# Patient Record
Sex: Female | Born: 1960 | Race: White | Hispanic: No | Marital: Married | Smoking: Former smoker
Health system: Southern US, Community
[De-identification: ages and names within clinical notes are randomized; demographics above are authoritative.]

## PROBLEM LIST (undated history)

## (undated) DIAGNOSIS — D0510 Intraductal carcinoma in situ of unspecified breast: Secondary | ICD-10-CM

## (undated) DIAGNOSIS — E782 Mixed hyperlipidemia: Secondary | ICD-10-CM

## (undated) DIAGNOSIS — K219 Gastro-esophageal reflux disease without esophagitis: Secondary | ICD-10-CM

## (undated) DIAGNOSIS — E10649 Type 1 diabetes mellitus with hypoglycemia without coma: Secondary | ICD-10-CM

## (undated) DIAGNOSIS — Z8719 Personal history of other diseases of the digestive system: Secondary | ICD-10-CM

## (undated) DIAGNOSIS — H353230 Exudative age-related macular degeneration, bilateral, stage unspecified: Secondary | ICD-10-CM

## (undated) DIAGNOSIS — D649 Anemia, unspecified: Secondary | ICD-10-CM

## (undated) DIAGNOSIS — J45909 Unspecified asthma, uncomplicated: Secondary | ICD-10-CM

## (undated) DIAGNOSIS — E038 Other specified hypothyroidism: Secondary | ICD-10-CM

## (undated) DIAGNOSIS — Z87442 Personal history of urinary calculi: Secondary | ICD-10-CM

## (undated) DIAGNOSIS — Z800 Family history of malignant neoplasm of digestive organs: Secondary | ICD-10-CM

## (undated) HISTORY — DX: Family history of malignant neoplasm of digestive organs: Z80.0

## (undated) HISTORY — DX: Type 1 diabetes mellitus with hypoglycemia without coma: E10.649

## (undated) HISTORY — PX: ENDOMETRIAL ABLATION: SHX621

## (undated) HISTORY — DX: Intraductal carcinoma in situ of unspecified breast: D05.10

## (undated) HISTORY — PX: OTHER SURGICAL HISTORY: SHX169

## (undated) HISTORY — DX: Mixed hyperlipidemia: E78.2

## (undated) HISTORY — DX: Other specified hypothyroidism: E03.8

## (undated) HISTORY — DX: Exudative age-related macular degeneration, bilateral, stage unspecified: H35.3230

## (undated) HISTORY — PX: BREAST LUMPECTOMY: SHX2

## (undated) HISTORY — DX: Anemia, unspecified: D64.9

---

## 2002-02-07 ENCOUNTER — Emergency Department (HOSPITAL_COMMUNITY): Admission: EM | Admit: 2002-02-07 | Discharge: 2002-02-07 | Payer: Self-pay | Admitting: Emergency Medicine

## 2002-07-05 ENCOUNTER — Encounter: Admission: RE | Admit: 2002-07-05 | Discharge: 2002-07-05 | Payer: Self-pay | Admitting: Family Medicine

## 2002-07-05 ENCOUNTER — Encounter: Payer: Self-pay | Admitting: Family Medicine

## 2004-07-09 ENCOUNTER — Encounter: Admission: RE | Admit: 2004-07-09 | Discharge: 2004-07-09 | Payer: Self-pay | Admitting: Family Medicine

## 2006-07-27 ENCOUNTER — Other Ambulatory Visit: Admission: RE | Admit: 2006-07-27 | Discharge: 2006-07-27 | Payer: Self-pay | Admitting: Family Medicine

## 2007-09-12 ENCOUNTER — Other Ambulatory Visit: Admission: RE | Admit: 2007-09-12 | Discharge: 2007-09-12 | Payer: Self-pay | Admitting: Family Medicine

## 2007-12-26 ENCOUNTER — Encounter: Admission: RE | Admit: 2007-12-26 | Discharge: 2007-12-26 | Payer: Self-pay | Admitting: Family Medicine

## 2008-05-13 ENCOUNTER — Encounter: Admission: RE | Admit: 2008-05-13 | Discharge: 2008-05-13 | Payer: Self-pay | Admitting: Family Medicine

## 2008-07-12 ENCOUNTER — Encounter: Admission: RE | Admit: 2008-07-12 | Discharge: 2008-07-12 | Payer: Self-pay | Admitting: Family Medicine

## 2008-08-07 ENCOUNTER — Ambulatory Visit: Payer: Self-pay | Admitting: Obstetrics and Gynecology

## 2008-08-07 ENCOUNTER — Encounter: Payer: Self-pay | Admitting: Obstetrics and Gynecology

## 2008-08-07 ENCOUNTER — Other Ambulatory Visit: Admission: RE | Admit: 2008-08-07 | Discharge: 2008-08-07 | Payer: Self-pay | Admitting: Obstetrics and Gynecology

## 2008-09-02 ENCOUNTER — Ambulatory Visit: Payer: Self-pay | Admitting: Obstetrics and Gynecology

## 2008-09-08 ENCOUNTER — Emergency Department (HOSPITAL_COMMUNITY): Admission: EM | Admit: 2008-09-08 | Discharge: 2008-09-08 | Payer: Self-pay | Admitting: Emergency Medicine

## 2008-09-10 ENCOUNTER — Emergency Department (HOSPITAL_COMMUNITY): Admission: EM | Admit: 2008-09-10 | Discharge: 2008-09-11 | Payer: Self-pay | Admitting: Emergency Medicine

## 2008-11-01 ENCOUNTER — Ambulatory Visit: Payer: Self-pay | Admitting: Obstetrics and Gynecology

## 2008-11-07 ENCOUNTER — Ambulatory Visit: Payer: Self-pay | Admitting: Obstetrics and Gynecology

## 2008-11-07 ENCOUNTER — Ambulatory Visit (HOSPITAL_BASED_OUTPATIENT_CLINIC_OR_DEPARTMENT_OTHER): Admission: RE | Admit: 2008-11-07 | Discharge: 2008-11-07 | Payer: Self-pay | Admitting: Obstetrics and Gynecology

## 2008-11-07 ENCOUNTER — Encounter: Payer: Self-pay | Admitting: Obstetrics and Gynecology

## 2010-12-29 LAB — GLUCOSE, CAPILLARY: Glucose-Capillary: 171 mg/dL — ABNORMAL HIGH (ref 70–99)

## 2010-12-29 LAB — POCT I-STAT, CHEM 8
BUN: 12 mg/dL (ref 6–23)
Chloride: 105 mEq/L (ref 96–112)
Creatinine, Ser: 0.5 mg/dL (ref 0.4–1.2)
Potassium: 4.2 mEq/L (ref 3.5–5.1)
Sodium: 139 mEq/L (ref 135–145)
TCO2: 24 mmol/L (ref 0–100)

## 2011-01-26 NOTE — Op Note (Signed)
NAMEPAYTIENCE, BURES             ACCOUNT NO.:  [id number]   MEDICAL RECORD NO.:  [id number]          PATIENT TYPE:  AMB   LOCATION:  [facility]                         FACILITY:  [facility]   PHYSICIAN:  Daniel L. Gottsegen, M.D.DATE OF BIRTH:  28-Feb-1961   DATE OF PROCEDURE:  11/07/2008  DATE OF DISCHARGE:                               OPERATIVE REPORT   PREOPERATIVE DIAGNOSIS:  Menometrorrhagia.   POSTOPERATIVE DIAGNOSIS:  Menometrorrhagia, plus endometrial polyps.   NAME OF OPERATION:  Dilatation and curettage.   SURGEON:  Dr. Eda Paschal.   ANESTHESIA:  General.   INDICATIONS:  The patient is a 50 year old gravida 2, para 1-2-0-4 who  had presented to the office with extremely heavy periods as well as  bleeding between her periods.  Endometrial biopsy in the office was  normal.  Saline infusion histogram showed multiple intrauterine cavity  defects consistent with small polyps.  The patient also has myomas, and  so it was certainly possible she had a submucous myoma as well creating  the above.  She now enters the hospital for hysteroscopy dilatation and  curettage and excision of any intrauterine pathology.   FINDINGS:  External is normal.  BUS is normal.  Vaginal is normal.  Cervix is clean.  Uterus is top normal size and shape with almost no  descensus.  Adnexa fails to reveal masses.  At the time of hysteroscopy,  the patient had multiple endometrial polyps, both on  the anterior and  posterior walls of the fundus, and they were generally of  the size of 1-  2 cm.  Once these had been removed, the endometrial cavity was clean as  well as the intracervical cavity.   PROCEDURE:  After adequate general anesthesia, the patient was placed in  dorsal supine position, prepped and draped in the usual sterile manner.  A single-tooth tenaculum was placed in the anterior lip of the cervix.  The cervix was dilated to a #33 Pratt dilator, and a hysteroscopic  resectoscope was introduced with  ease.  Three percent sorbitol was used  to expand the intrauterine cavity.  A camera was used for magnification,  and 90-degree wire loop attached to appropriate Bovie settings was also  utilized.  The polyps were seen.  Initially, some curetting was done  because of the lush buildup of the endometrium, and then using the  resectoscope, the polyps were resected and removed as well.  She was  then curetted more because there was still a lot of endometrium left.  She was then rehysteroscoped a third time, and at this point, she had a  clean endometrial cavity with no more pathology.  The specimen showed  significant amount of endometrium and polyps.  It was sent in one  specimen to the lab for tissue diagnosis.  There was no bleeding at the  termination of the procedure from the intrauterine cavity.  Blood loss  was minimal.  Fluid deficit was 45 mL.  The patient tolerated the  procedure well and left the operating room in satisfactory condition.     Daniel L. Eda Paschal, M.D.  Electronically Signed  DLG/MEDQ  D:  11/07/2008  T:  11/07/2008  Job:  [id number]

## 2011-06-18 LAB — URINE CULTURE
Colony Count: NO GROWTH
Culture: NO GROWTH

## 2011-06-18 LAB — DIFFERENTIAL
Basophils Relative: 1 % (ref 0–1)
Eosinophils Absolute: 0 10*3/uL (ref 0.0–0.7)
Eosinophils Relative: 0 % (ref 0–5)
Lymphs Abs: 1.1 10*3/uL (ref 0.7–4.0)
Monocytes Relative: 7 % (ref 3–12)
Neutrophils Relative %: 84 % — ABNORMAL HIGH (ref 43–77)

## 2011-06-18 LAB — URINALYSIS, ROUTINE W REFLEX MICROSCOPIC
Bilirubin Urine: NEGATIVE
Glucose, UA: NEGATIVE mg/dL
Ketones, ur: NEGATIVE mg/dL
Leukocytes, UA: NEGATIVE
Nitrite: NEGATIVE
Nitrite: NEGATIVE
Protein, ur: NEGATIVE mg/dL
Specific Gravity, Urine: 1.026 (ref 1.005–1.030)
Urobilinogen, UA: 0.2 mg/dL (ref 0.0–1.0)
pH: 6 (ref 5.0–8.0)

## 2011-06-18 LAB — CBC
HCT: 33.9 % — ABNORMAL LOW (ref 36.0–46.0)
MCHC: 33.9 g/dL (ref 30.0–36.0)
MCV: 86.7 fL (ref 78.0–100.0)
Platelets: 218 10*3/uL (ref 150–400)
WBC: 13.3 10*3/uL — ABNORMAL HIGH (ref 4.0–10.5)

## 2011-06-18 LAB — POCT I-STAT, CHEM 8
Creatinine, Ser: 1.2 mg/dL (ref 0.4–1.2)
Glucose, Bld: 187 mg/dL — ABNORMAL HIGH (ref 70–99)
HCT: 36 % (ref 36.0–46.0)
Hemoglobin: 12.2 g/dL (ref 12.0–15.0)
Potassium: 4.4 mEq/L (ref 3.5–5.1)
Sodium: 134 mEq/L — ABNORMAL LOW (ref 135–145)
TCO2: 22 mmol/L (ref 0–100)

## 2011-06-18 LAB — GLUCOSE, CAPILLARY: Glucose-Capillary: 212 mg/dL — ABNORMAL HIGH (ref 70–99)

## 2011-06-18 LAB — URINE MICROSCOPIC-ADD ON

## 2019-08-07 ENCOUNTER — Encounter: Payer: Self-pay | Admitting: Family Medicine

## 2019-10-16 ENCOUNTER — Other Ambulatory Visit: Payer: Self-pay | Admitting: Family Medicine

## 2019-11-01 ENCOUNTER — Other Ambulatory Visit: Payer: Self-pay | Admitting: Family Medicine

## 2019-12-14 ENCOUNTER — Other Ambulatory Visit: Payer: Self-pay | Admitting: Family Medicine

## 2019-12-24 DIAGNOSIS — E782 Mixed hyperlipidemia: Secondary | ICD-10-CM | POA: Insufficient documentation

## 2019-12-24 DIAGNOSIS — E039 Hypothyroidism, unspecified: Secondary | ICD-10-CM | POA: Insufficient documentation

## 2019-12-24 DIAGNOSIS — E10649 Type 1 diabetes mellitus with hypoglycemia without coma: Secondary | ICD-10-CM | POA: Insufficient documentation

## 2019-12-24 DIAGNOSIS — E038 Other specified hypothyroidism: Secondary | ICD-10-CM | POA: Insufficient documentation

## 2019-12-24 DIAGNOSIS — E1065 Type 1 diabetes mellitus with hyperglycemia: Secondary | ICD-10-CM | POA: Insufficient documentation

## 2019-12-24 NOTE — Progress Notes (Signed)
Established Patient Office Visit  Subjective:  Patient ID: Nicole Marquez, female    DOB: 1961/01/30  Age: 59 y.o. MRN: [id number]  CC:  Chief Complaint  Patient presents with  . Hyperlipidemia  . Diabetes  . Hypothyroidism    HPI Nicole Marquez presents with type 1 diabetes mellitus without complications.  Specifically, this is type 1 diabetes without complications.  Compliance with treatment has been good; she takes her medication as directed, maintains her diet and exercise regimen, follows up as directed, and is keeping a glucose diary.  Date of diagnosis 16.  Patient's diabetes was first diagnosed 50 years ago.  She follows a prescribed diet.  She denies experiencing any diabetes related symptoms.  Current meds include insulin/injectable ( NovoLog; Tyler Aas ) and Altace.  The hypoglycemic episodes are with blood sugars recorded in 60s.  She reports home blood glucose readings 60s in am, 130 at lunch, 250 postprandial to supper, and 150s before bedtime. She has free style libre.  In regard to preventative care, she performs foot self-exams daily. She sees the eye doctor regularly for macular degeneration which is improving with injections.    With regard to the other specified hypothyroidism, she cannot recall when the diagnosis of was made.  She is currently taking Synthroid, 100 mcg daily.     Past Medical History:  Diagnosis Date  . Exudative age-related macular degeneration, bilateral, stage unspecified ([place])   . Family history of malignant neoplasm of digestive organs   . Mixed hyperlipidemia   . Other specified hypothyroidism   . Type 1 diabetes mellitus with hypoglycemia without coma [facility])     Past Surgical History:  Procedure Laterality Date  . CESAREAN SECTION     x3  . ENDOMETRIAL ABLATION    . Right rotator cuff repair      Family History  Problem Relation Age of Onset  . Colon cancer Brother   . Cancer Other   . Anuerysm Other   . Diabetes type II  Other     Social History   Socioeconomic History  . Marital status: Married    Spouse name: Not on file  . Number of children: 4  . Years of education: Not on file  . Highest education level: Not on file  Occupational History  . Not on file  Tobacco Use  . Smoking status: Former Smoker    Types: Cigarettes    Quit date: 2008    Years since quitting: 13.3  . Smokeless tobacco: Never Used  Substance and Sexual Activity  . Alcohol use: Never  . Drug use: Never  . Sexual activity: Not on file  Other Topics Concern  . Not on file  Social History Narrative   Patient has 1 set of twins and two individual children.   Social Determinants of Health   Financial Resource Strain:   . Difficulty of Paying Living Expenses:   Food Insecurity:   . Worried About Charity fundraiser in the Last Year:   . Arboriculturist in the Last Year:   Transportation Needs:   . Film/video editor (Medical):   Marland Kitchen Lack of Transportation (Non-Medical):   Physical Activity:   . Days of Exercise per Week:   . Minutes of Exercise per Session:   Stress:   . Feeling of Stress :   Social Connections:   . Frequency of Communication with Friends and Family:   . Frequency of Social Gatherings with Friends and Family:   .  Attends Religious Services:   . Active Member of Clubs or Organizations:   . Attends Archivist Meetings:   Marland Kitchen Marital Status:   Intimate Partner Violence:   . Fear of Current or Ex-Partner:   . Emotionally Abused:   Marland Kitchen Physically Abused:   . Sexually Abused:     Outpatient Medications Prior to Visit  Medication Sig Dispense Refill  . albuterol (VENTOLIN HFA) 108 (90 Base) MCG/ACT inhaler INHALE 1 TO 2 PUFFS EVERY 4 HOURS AS NEEDED FOR WHEEZING 8.5 g 2  . Continuous Blood Gluc Sensor (FREESTYLE LIBRE 2 SENSOR) MISC CHECK GLUCOSE EVERY DAY BEFORE A MEAL AND EVERY NIGHT AT BEDTIME 6 each 3  . insulin aspart (NOVOLOG FLEXPEN) 100 UNIT/ML FlexPen Inject into the skin 3  (three) times daily with meals.    . Insulin Pen Needle 32G X 4 MM MISC by Does not apply route.    . rosuvastatin (CRESTOR) 40 MG tablet Take 40 mg by mouth daily.    . TRESIBA FLEXTOUCH 200 UNIT/ML SOPN ADMINISTER 28 UNITS UNDER THE SKIN DAILY 15 mL 1  . levothyroxine (SYNTHROID) 100 MCG tablet Take 100 mcg by mouth daily before breakfast.    . ramipril (ALTACE) 5 MG capsule Take 5 mg by mouth daily.     No facility-administered medications prior to visit.    Allergies  Allergen Reactions  . Ciprofloxacin     ROS Review of Systems  Constitutional: Negative for chills, fatigue and fever.  HENT: Negative for congestion, ear pain, rhinorrhea and sore throat.   Respiratory: Positive for shortness of breath. Negative for cough.        With anxiety. Using albuterol a little more.   Cardiovascular: Negative for chest pain.  Gastrointestinal: Negative for abdominal pain, constipation, diarrhea, nausea and vomiting.  Endocrine: Negative for polydipsia, polyphagia and polyuria.  Genitourinary: Negative for dysuria and urgency.  Musculoskeletal: Negative for back pain and myalgias.  Neurological: Positive for headaches. Negative for dizziness, weakness and light-headedness.       Often. 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 Has improved sight. Gets Headaches when gets shots.   Psychiatric/Behavioral: Positive for sleep disturbance. Negative for dysphoric mood and suicidal ideas. The patient is nervous/anxious.        Panic attacks 4 times in the last 6 months. Worry every day. Took xanax when her mother died. Difficulty going to sleep and staying asleep. Gets on average 4 hours.      Objective:    Physical Exam  Constitutional: She is oriented to person, place, and time. She appears well-developed and well-nourished.  Cardiovascular: Normal rate, regular rhythm and normal heart sounds.  Pulmonary/Chest: Effort normal and breath sounds normal. No respiratory distress.  Abdominal: Soft.  Bowel sounds are normal. There is no abdominal tenderness.  Neurological: She is alert and oriented to person, place, and time.  Psychiatric: She has a normal mood and affect. Her behavior is normal.   Diabetic Foot Exam - Simple   Simple Foot Form Visual Inspection No deformities, no ulcerations, no other skin breakdown bilaterally: Yes Sensation Testing Intact to touch and monofilament testing bilaterally: Yes Pulse Check Posterior Tibialis and Dorsalis pulse intact bilaterally: Yes Comments    BP 118/64 (BP Location: Left Arm, Patient Position: Sitting)   Pulse 79   Temp (!) 97.3 F (36.3 C) (Temporal)   Ht 5' (1.524 m)   Wt 113 lb (51.3 kg)   SpO2 100%   BMI 22.07 kg/m  Wt Readings from  Last 3 Encounters:  12/25/19 113 lb (51.3 kg)     Health Maintenance Due  Topic Date Due  . Hepatitis C Screening  Never done  . PNEUMOCOCCAL POLYSACCHARIDE VACCINE AGE 89-64 HIGH RISK  Never done  . FOOT EXAM  Never done  . OPHTHALMOLOGY EXAM  Never done  . HIV Screening  Never done  . COVID-19 Vaccine (1) Never done  . TETANUS/TDAP  Never done  . PAP SMEAR-Modifier  Never done  . COLONOSCOPY  Never done  . MAMMOGRAM  12/25/2009    There are no preventive care reminders to display for this patient.  Lab Results  Component Value Date   TSH 0.152 (L) 12/25/2019   Lab Results  Component Value Date   WBC 10.8 12/25/2019   HGB 16.2 (H) 12/25/2019   HCT 48.6 (H) 12/25/2019   MCV 91 12/25/2019   PLT 262 12/25/2019   Lab Results  Component Value Date   NA 141 12/25/2019   K 4.5 12/25/2019   CO2 25 12/25/2019   GLUCOSE 41 (L) 12/25/2019   BUN 12 12/25/2019   CREATININE 0.83 12/25/2019   BILITOT 0.2 12/25/2019   ALKPHOS 75 12/25/2019   AST 23 12/25/2019   ALT 12 12/25/2019   PROT 6.6 12/25/2019   ALBUMIN 4.7 12/25/2019   CALCIUM 10.4 (H) 12/25/2019   Lab Results  Component Value Date   CHOL 240 (H) 12/25/2019   Lab Results  Component Value Date   HDL 60  12/25/2019   Lab Results  Component Value Date   LDLCALC 162 (H) 12/25/2019   Lab Results  Component Value Date   TRIG 101 12/25/2019   Lab Results  Component Value Date   CHOLHDL 4.0 12/25/2019   Lab Results  Component Value Date   HGBA1C 6.3 (H) 12/25/2019      Assessment & Plan:  1. Type 1 diabetes mellitus with hypoglycemia without coma ([place]).  Control: good Recommend check sugars fasting before meals and before bed. Recommend check feet daily. Recommend annual eye exams. Medicines: Decrease tresiba to 26 U daily. Continue novolog SSI Continue to work on eating a healthy diet and exercise.  Labs drawn today.   - Hemoglobin A1c - POCT UA - Microalbumin  2. Mixed hyperlipidemia Still poorly controlled.  Patient has only been taking crestor 40 mg daily 3 times a week. She will try to increase to four times a wee.   Continue to work on eating a healthy diet and exercise.  Labs drawn today.  - Lipid panel - Comprehensive metabolic panel - CBC with Differential/Platelet  3. Other specified hypothyroidism Not therapeutic. Decrease synthroid to 88 mcg daily. Recheck in 6-8 weeks. - TSH  5. Mild intermittent asthma without complication Continue use of albuterol. If increased use to more than once daily, call and will start on a preventative medicine.   Follow-up: Return in about 3 months (around 03/25/2020) for fasting.    Rochel Brome, MD

## 2019-12-25 ENCOUNTER — Encounter: Payer: Self-pay | Admitting: Family Medicine

## 2019-12-25 ENCOUNTER — Other Ambulatory Visit: Payer: Self-pay

## 2019-12-25 ENCOUNTER — Ambulatory Visit: Payer: BC Managed Care – PPO | Admitting: Family Medicine

## 2019-12-25 VITALS — BP 118/64 | HR 79 | Temp 97.3°F | Ht 60.0 in | Wt 113.0 lb

## 2019-12-25 DIAGNOSIS — E038 Other specified hypothyroidism: Secondary | ICD-10-CM

## 2019-12-25 DIAGNOSIS — J4520 Mild intermittent asthma, uncomplicated: Secondary | ICD-10-CM

## 2019-12-25 DIAGNOSIS — E10649 Type 1 diabetes mellitus with hypoglycemia without coma: Secondary | ICD-10-CM

## 2019-12-25 DIAGNOSIS — F410 Panic disorder [episodic paroxysmal anxiety] without agoraphobia: Secondary | ICD-10-CM | POA: Insufficient documentation

## 2019-12-25 DIAGNOSIS — E782 Mixed hyperlipidemia: Secondary | ICD-10-CM | POA: Diagnosis not present

## 2019-12-25 LAB — POCT UA - MICROALBUMIN: Microalbumin Ur, POC: 150 mg/L

## 2019-12-25 NOTE — Patient Instructions (Addendum)
Type 1 DM - decrease tresiba to 26 U daily in am.  Panic attacks - start lorazepam 0.5 mg once daily as needed for panic attacks. Asthma - use albuterol inhaler. If worsens, call back and will give another medicine.  Panic Attack  A panic attack is when you suddenly feel very afraid, uncomfortable, or nervous (anxious). A panic attack can happen when you are scared or for no reason. A panic attack can feel like a serious problem. It can even feel like a heart attack or stroke. See your doctor when you have a panic attack to make sure you do not have a serious problem. Follow these instructions at home:  Take medicines only as told by your doctor.  If you feel worried or nervous, try not to have caffeine.  Take good care of your health. To do this: ? Eat healthy. Make sure to eat fresh fruits and vegetables, whole grains, lean meats, and low-fat dairy. ? Get enough sleep. Try to sleep for 7-8 hours each night. ? Exercise. Try to be active for 30 minutes 5 or more days a week. ? Do not smoke. Talk to your doctor if you need help quitting. ? Limit how much alcohol you drink:  If you are a woman who is not pregnant: try not to have more than 1 drink a day.  If you are a man: try not to have more than 2 drinks a day.  One drink equals 12 oz of beer, 5 oz of wine, or 1 oz of hard liquor.  Keep all follow-up visits as told by your doctor. This is important. Contact a doctor if:  Your symptoms do not get better.  Your symptoms get worse.  You are not able to take your medicines as told. Get help right away if:  You have thoughts of hurting yourself or others.  You have symptoms of a panic attack. Do not drive yourself to the hospital. Have someone else drive you or call an ambulance. If you feel like you may hurt yourself or others, or have thoughts about taking your own life, get help right away. You can go to your nearest emergency department or call:  Your local emergency  services (911 in the [place]  A suicide crisis helpline, such as the [facility] at (229)073-5725. This is open 24 hours a day. Summary  A panic attack is when you suddenly feel very afraid, uncomfortable, or nervous (anxious).  See your doctor when you have a panic attack to make sure that you do not have another serious problem.  If you feel like you may hurt yourself or others, get help right away by calling 911. This information is not intended to replace advice given to you by your health care provider. Make sure you discuss any questions you have with your health care provider. Document Revised: 08/12/2017 Document Reviewed: 10/13/2016 Elsevier Patient Education  [place], Adult After being diagnosed with an anxiety disorder, you may be relieved to know why you have felt or behaved a certain way. You may also feel overwhelmed about the treatment ahead and what it will mean for your life. With care and support, you can manage this condition and recover from it. How to manage lifestyle changes Managing stress and anxiety  Stress is your body's reaction to life changes and events, both good and bad. Most stress will last just a few hours, but stress can be ongoing and can lead to  more than just stress. Although stress can play a major role in anxiety, it is not the same as anxiety. Stress is usually caused by something external, such as a deadline, test, or competition. Stress normally passes after the triggering event has ended.  Anxiety is caused by something internal, such as imagining a terrible outcome or worrying that something will go wrong that will devastate you. Anxiety often does not go away even after the triggering event is over, and it can become long-term (chronic) worry. It is important to understand the differences between stress and anxiety and to manage your stress effectively so that it does not lead to an anxious  response. Talk with your health care provider or a counselor to learn more about reducing anxiety and stress. He or she may suggest tension reduction techniques, such as:  Music therapy. This can include creating or listening to music that you enjoy and that inspires you.  Mindfulness-based meditation. This involves being aware of your normal breaths while not trying to control your breathing. It can be done while sitting or walking.  Centering prayer. This involves focusing on a word, phrase, or sacred image that means something to you and brings you peace.  Deep breathing. To do this, expand your stomach and inhale slowly through your nose. Hold your breath for 3-5 seconds. Then exhale slowly, letting your stomach muscles relax.  Self-talk. This involves identifying thought patterns that lead to anxiety reactions and changing those patterns.  Muscle relaxation. This involves tensing muscles and then relaxing them. Choose a tension reduction technique that suits your lifestyle and personality. These techniques take time and practice. Set aside 5-15 minutes a day to do them. Therapists can offer counseling and training in these techniques. The training to help with anxiety may be covered by some insurance plans. Other things you can do to manage stress and anxiety include:  Keeping a stress/anxiety diary. This can help you learn what triggers your reaction and then learn ways to manage your response.  Thinking about how you react to certain situations. You may not be able to control everything, but you can control your response.  Making time for activities that help you relax and not feeling guilty about spending your time in this way.  Visual imagery and yoga can help you stay calm and relax.  Medicines Medicines can help ease symptoms. Medicines for anxiety include:  Anti-anxiety drugs.  Antidepressants. Medicines are often used as a primary treatment for anxiety disorder. Medicines  will be prescribed by a health care provider. When used together, medicines, psychotherapy, and tension reduction techniques may be the most effective treatment. Relationships Relationships can play a big part in helping you recover. Try to spend more time connecting with trusted friends and family members. Consider going to couples counseling, taking family education classes, or going to family therapy. Therapy can help you and others better understand your condition. How to recognize changes in your anxiety Everyone responds differently to treatment for anxiety. Recovery from anxiety happens when symptoms decrease and stop interfering with your daily activities at home or work. This may mean that you will start to:  Have better concentration and focus. Worry will interfere less in your daily thinking.  Sleep better.  Be less irritable.  Have more energy.  Have improved memory. It is important to recognize when your condition is getting worse. Contact your health care provider if your symptoms interfere with home or work and you feel like your condition is not  improving. Follow these instructions at home: Activity  Exercise. Most adults should do the following: ? Exercise for at least 150 minutes each week. The exercise should increase your heart rate and make you sweat (moderate-intensity exercise). ? Strengthening exercises at least twice a week.  Get the right amount and quality of sleep. Most adults need 7-9 hours of sleep each night. Lifestyle   Eat a healthy diet that includes plenty of vegetables, fruits, whole grains, low-fat dairy products, and lean protein. Do not eat a lot of foods that are high in solid fats, added sugars, or salt.  Make choices that simplify your life.  Do not use any products that contain nicotine or tobacco, such as cigarettes, e-cigarettes, and chewing tobacco. If you need help quitting, ask your health care provider.  Avoid caffeine, alcohol, and  certain over-the-counter cold medicines. These may make you feel worse. Ask your pharmacist which medicines to avoid. General instructions  Take over-the-counter and prescription medicines only as told by your health care provider.  Keep all follow-up visits as told by your health care provider. This is important. Where to find support You can get help and support from these sources:  Self-help groups.  Online and [employer].  A trusted spiritual leader.  Couples counseling.  Family education classes.  Family therapy. Where to find more information You may find that joining a support group helps you deal with your anxiety. The following sources can help you locate counselors or support groups near you:  [place]: www.mentalhealthamer[place].net  Anxiety and Depression Association of [place] (ADAA): https://www.clark.net/  National Alliance on Mental Illness (NAMI): www.nami.org Contact a health care provider if you:  Have a hard time staying focused or finishing daily tasks.  Spend many hours a day feeling worried about everyday life.  Become exhausted by worry.  Start to have headaches, feel tense, or have nausea.  Urinate more than normal.  Have diarrhea. Get help right away if you have:  A racing heart and shortness of breath.  Thoughts of hurting yourself or others. If you ever feel like you may hurt yourself or others, or have thoughts about taking your own life, get help right away. You can go to your nearest emergency department or call:  Your local emergency services (911 in the [place]  A suicide crisis helpline, such as the [facility] at 219-288-3346. This is open 24 hours a day. Summary  Taking steps to learn and use tension reduction techniques can help calm you and help prevent triggering an anxiety reaction.  When used together, medicines, psychotherapy, and tension reduction techniques may be the most  effective treatment.  Family, friends, and partners can play a big part in helping you recover from an anxiety disorder. This information is not intended to replace advice given to you by your health care provider. Make sure you discuss any questions you have with your health care provider. Document Revised: 01/30/2019 Document Reviewed: 01/30/2019 Elsevier Patient Education  [place].  Generalized Anxiety Disorder, Adult Generalized anxiety disorder (GAD) is a mental health disorder. People with this condition constantly worry about everyday events. Unlike normal anxiety, worry related to GAD is not triggered by a specific event. These worries also do not fade or get better with time. GAD interferes with life functions, including relationships, work, and school. GAD can vary from mild to severe. People with severe GAD can have intense waves of anxiety with physical symptoms (panic attacks). What are the causes?  The exact cause of GAD is not known. What increases the risk? This condition is more likely to develop in:  Women.  People who have a family history of anxiety disorders.  People who are very shy.  People who experience very stressful life events, such as the death of a loved one.  People who have a very stressful family environment. What are the signs or symptoms? People with GAD often worry excessively about many things in their lives, such as their health and family. They may also be overly concerned about:  Doing well at work.  Being on time.  Natural disasters.  Friendships. Physical symptoms of GAD include:  Fatigue.  Muscle tension or having muscle twitches.  Trembling or feeling shaky.  Being easily startled.  Feeling like your heart is pounding or racing.  Feeling out of breath or like you cannot take a deep breath.  Having trouble falling asleep or staying asleep.  Sweating.  Nausea, diarrhea, or irritable bowel syndrome  (IBS).  Headaches.  Trouble concentrating or remembering facts.  Restlessness.  Irritability. How is this diagnosed? Your health care provider can diagnose GAD based on your symptoms and medical history. You will also have a physical exam. The health care provider will ask specific questions about your symptoms, including how severe they are, when they started, and if they come and go. Your health care provider may ask you about your use of alcohol or drugs, including prescription medicines. Your health care provider may refer you to a mental health specialist for further evaluation. Your health care provider will do a thorough examination and may perform additional tests to rule out other possible causes of your symptoms. To be diagnosed with GAD, a person must have anxiety that:  Is out of his or her control.  Affects several different aspects of his or her life, such as work and relationships.  Causes distress that makes him or her unable to take part in normal activities.  Includes at least three physical symptoms of GAD, such as restlessness, fatigue, trouble concentrating, irritability, muscle tension, or sleep problems. Before your health care provider can confirm a diagnosis of GAD, these symptoms must be present more days than they are not, and they must last for six months or longer. How is this treated? The following therapies are usually used to treat GAD:  Medicine. Antidepressant medicine is usually prescribed for long-term daily control. Antianxiety medicines may be added in severe cases, especially when panic attacks occur.  Talk therapy (psychotherapy). Certain types of talk therapy can be helpful in treating GAD by providing support, education, and guidance. Options include: ? Cognitive behavioral therapy (CBT). People learn coping skills and techniques to ease their anxiety. They learn to identify unrealistic or negative thoughts and behaviors and to replace them with  positive ones. ? Acceptance and commitment therapy (ACT). This treatment teaches people how to be mindful as a way to cope with unwanted thoughts and feelings. ? Biofeedback. This process trains you to manage your body's response (physiological response) through breathing techniques and relaxation methods. You will work with a therapist while machines are used to monitor your physical symptoms.  Stress management techniques. These include yoga, meditation, and exercise. A mental health specialist can help determine which treatment is best for you. Some people see improvement with one type of therapy. However, other people require a combination of therapies. Follow these instructions at home:  Take over-the-counter and prescription medicines only as told by your health care provider.  Try to maintain a normal routine.  Try to anticipate stressful situations and allow extra time to manage them.  Practice any stress management or self-calming techniques as taught by your health care provider.  Do not punish yourself for setbacks or for not making progress.  Try to recognize your accomplishments, even if they are small.  Keep all follow-up visits as told by your health care provider. This is important. Contact a health care provider if:  Your symptoms do not get better.  Your symptoms get worse.  You have signs of depression, such as: ? A persistently sad, cranky, or irritable mood. ? Loss of enjoyment in activities that used to bring you joy. ? Change in weight or eating. ? Changes in sleeping habits. ? Avoiding friends or family members. ? Loss of energy for normal tasks. ? Feelings of guilt or worthlessness. Get help right away if:  You have serious thoughts about hurting yourself or others. If you ever feel like you may hurt yourself or others, or have thoughts about taking your own life, get help right away. You can go to your nearest emergency department or call:  Your local  emergency services (911 in the [place]  A suicide crisis helpline, such as the [facility] at 226-038-3080. This is open 24 hours a day. Summary  Generalized anxiety disorder (GAD) is a mental health disorder that involves worry that is not triggered by a specific event.  People with GAD often worry excessively about many things in their lives, such as their health and family.  GAD may cause physical symptoms such as restlessness, trouble concentrating, sleep problems, frequent sweating, nausea, diarrhea, headaches, and trembling or muscle twitching.  A mental health specialist can help determine which treatment is best for you. Some people see improvement with one type of therapy. However, other people require a combination of therapies. This information is not intended to replace advice given to you by your health care provider. Make sure you discuss any questions you have with your health care provider. Document Revised: 08/12/2017 Document Reviewed: 07/20/2016 Elsevier Patient Education  2020 [employer].

## 2019-12-26 ENCOUNTER — Other Ambulatory Visit: Payer: Self-pay | Admitting: Family Medicine

## 2019-12-26 ENCOUNTER — Telehealth: Payer: Self-pay

## 2019-12-26 LAB — COMPREHENSIVE METABOLIC PANEL
ALT: 12 IU/L (ref 0–32)
AST: 23 IU/L (ref 0–40)
Albumin/Globulin Ratio: 2.5 — ABNORMAL HIGH (ref 1.2–2.2)
Albumin: 4.7 g/dL (ref 3.8–4.8)
Alkaline Phosphatase: 75 IU/L (ref 39–117)
BUN/Creatinine Ratio: 14 (ref 12–28)
BUN: 12 mg/dL (ref 8–27)
Bilirubin Total: 0.2 mg/dL (ref 0.0–1.2)
CO2: 25 mmol/L (ref 20–29)
Calcium: 10.4 mg/dL — ABNORMAL HIGH (ref 8.7–10.3)
Chloride: 103 mmol/L (ref 96–106)
Creatinine, Ser: 0.83 mg/dL (ref 0.57–1.00)
GFR calc Af Amer: 88 mL/min/{1.73_m2} (ref >59)
GFR calc non Af Amer: 76 mL/min/{1.73_m2} (ref >59)
Globulin, Total: 1.9 g/dL (ref 1.5–4.5)
Glucose: 41 mg/dL — ABNORMAL LOW (ref 65–99)
Potassium: 4.5 mmol/L (ref 3.5–5.2)
Sodium: 141 mmol/L (ref 134–144)
Total Protein: 6.6 g/dL (ref 6.0–8.5)

## 2019-12-26 LAB — LIPID PANEL
Chol/HDL Ratio: 4 ratio (ref 0.0–4.4)
Cholesterol, Total: 240 mg/dL — ABNORMAL HIGH (ref 100–199)
HDL: 60 mg/dL (ref >39)
LDL Chol Calc (NIH): 162 mg/dL — ABNORMAL HIGH (ref 0–99)
Triglycerides: 101 mg/dL (ref 0–149)
VLDL Cholesterol Cal: 18 mg/dL (ref 5–40)

## 2019-12-26 LAB — CBC WITH DIFFERENTIAL/PLATELET
Basophils Absolute: 0.1 10*3/uL (ref 0.0–0.2)
Basos: 1 %
EOS (ABSOLUTE): 0.1 10*3/uL (ref 0.0–0.4)
Eos: 1 %
Hematocrit: 48.6 % — ABNORMAL HIGH (ref 34.0–46.6)
Hemoglobin: 16.2 g/dL — ABNORMAL HIGH (ref 11.1–15.9)
Immature Grans (Abs): 0.1 10*3/uL (ref 0.0–0.1)
Immature Granulocytes: 1 %
Lymphocytes Absolute: 3.7 10*3/uL — ABNORMAL HIGH (ref 0.7–3.1)
Lymphs: 34 %
MCH: 30.3 pg (ref 26.6–33.0)
MCHC: 33.3 g/dL (ref 31.5–35.7)
MCV: 91 fL (ref 79–97)
Monocytes Absolute: 0.7 10*3/uL (ref 0.1–0.9)
Monocytes: 6 %
Neutrophils Absolute: 6.2 10*3/uL (ref 1.4–7.0)
Neutrophils: 57 %
Platelets: 262 10*3/uL (ref 150–450)
RBC: 5.35 x10E6/uL — ABNORMAL HIGH (ref 3.77–5.28)
RDW: 12.3 % (ref 11.7–15.4)
WBC: 10.8 10*3/uL (ref 3.4–10.8)

## 2019-12-26 LAB — HEMOGLOBIN A1C
Est. average glucose Bld gHb Est-mCnc: 134 mg/dL
Hgb A1c MFr Bld: 6.3 % — ABNORMAL HIGH (ref 4.8–5.6)

## 2019-12-26 LAB — TSH: TSH: 0.152 u[IU]/mL — ABNORMAL LOW (ref 0.450–4.500)

## 2019-12-26 LAB — CARDIOVASCULAR RISK ASSESSMENT

## 2019-12-26 MED ORDER — LORAZEPAM 0.5 MG PO TABS: 0.5000 mg | ORAL_TABLET | Freq: Every day | ORAL | 1 refills | Status: DC | PRN

## 2019-12-26 NOTE — Telephone Encounter (Signed)
Sent lorazepam. kc

## 2019-12-26 NOTE — Telephone Encounter (Signed)
Patients husband called lvm stating that patient was suppose to have had a rx for Lorazepam sent to pharmacy yesterday?

## 2019-12-27 ENCOUNTER — Other Ambulatory Visit: Payer: Self-pay

## 2019-12-27 MED ORDER — RAMIPRIL 10 MG PO CAPS: 10.0000 mg | ORAL_CAPSULE | Freq: Every day | ORAL | 0 refills | Status: DC

## 2019-12-27 MED ORDER — LEVOTHYROXINE SODIUM 88 MCG PO TABS: 88.0000 ug | ORAL_TABLET | Freq: Every day | ORAL | 1 refills | Status: DC

## 2019-12-30 DIAGNOSIS — J4520 Mild intermittent asthma, uncomplicated: Secondary | ICD-10-CM | POA: Insufficient documentation

## 2020-01-04 ENCOUNTER — Other Ambulatory Visit: Payer: Self-pay | Admitting: Family Medicine

## 2020-02-18 ENCOUNTER — Other Ambulatory Visit: Payer: Self-pay | Admitting: Family Medicine

## 2020-03-08 ENCOUNTER — Other Ambulatory Visit: Payer: Self-pay | Admitting: Family Medicine

## 2020-03-14 ENCOUNTER — Other Ambulatory Visit: Payer: Self-pay | Admitting: Family Medicine

## 2020-03-25 ENCOUNTER — Other Ambulatory Visit: Payer: Self-pay | Admitting: Family Medicine

## 2020-03-28 ENCOUNTER — Ambulatory Visit: Payer: BC Managed Care – PPO | Admitting: Family Medicine

## 2020-04-14 NOTE — Progress Notes (Signed)
Acute Office Visit  Subjective:    Patient ID: Nicole Marquez, female    DOB: June 05, 1961, 59 y.o.   MRN: [id number]  Chief Complaint  Patient presents with  . Constipation    HPI Patient is in today for constipation states she has had this issue for 3-4 weeks. She thought maybe she pulled a muscle while working out 4 weeks ago however, the pain in her right abdomin has not improved and her constipation started 1 week after her abdominal pain. Has tried senna and dulcolax which have helped some. Has to take senna daily in order to have a BM. Was concerned and so instead changed increase fiber in her diet. Had to take dulcolax and this helped, but does not want to take senna or dulcolax daily. Hurts more when she lifts weight. She was working out 4 days a week, but had to stop. Some nausea.     Past Medical History:  Diagnosis Date  . Exudative age-related macular degeneration, bilateral, stage unspecified ([place])   . Family history of malignant neoplasm of digestive organs   . Mixed hyperlipidemia   . Other specified hypothyroidism   . Type 1 diabetes mellitus with hypoglycemia without coma [facility])     Past Surgical History:  Procedure Laterality Date  . CESAREAN SECTION     x3  . ENDOMETRIAL ABLATION    . Right rotator cuff repair      Family History  Problem Relation Age of Onset  . Colon cancer Brother   . Cancer Other   . Anuerysm Other   . Diabetes type II Other     Social History   Socioeconomic History  . Marital status: Married    Spouse name: Not on file  . Number of children: 4  . Years of education: Not on file  . Highest education level: Not on file  Occupational History  . Not on file  Tobacco Use  . Smoking status: Former Smoker    Types: Cigarettes    Quit date: 2008    Years since quitting: 13.6  . Smokeless tobacco: Never Used  Vaping Use  . Vaping Use: Never used  Substance and Sexual Activity  . Alcohol use: Never  . Drug use: Never  .  Sexual activity: Not on file  Other Topics Concern  . Not on file  Social History Narrative   Patient has 1 set of twins and two individual children.   Social Determinants of Health   Financial Resource Strain:   . Difficulty of Paying Living Expenses:   Food Insecurity:   . Worried About Charity fundraiser in the Last Year:   . Arboriculturist in the Last Year:   Transportation Needs:   . Film/video editor (Medical):   Marland Kitchen Lack of Transportation (Non-Medical):   Physical Activity:   . Days of Exercise per Week:   . Minutes of Exercise per Session:   Stress:   . Feeling of Stress :   Social Connections:   . Frequency of Communication with Friends and Family:   . Frequency of Social Gatherings with Friends and Family:   . Attends Religious Services:   . Active Member of Clubs or Organizations:   . Attends Archivist Meetings:   Marland Kitchen Marital Status:   Intimate Partner Violence:   . Fear of Current or Ex-Partner:   . Emotionally Abused:   Marland Kitchen Physically Abused:   . Sexually Abused:  Outpatient Medications Prior to Visit  Medication Sig Dispense Refill  . albuterol (VENTOLIN HFA) 108 (90 Base) MCG/ACT inhaler INHALE 1 TO 2 PUFFS EVERY 4 HOURS AS NEEDED FOR WHEEZING 8.5 g 2  . Continuous Blood Gluc Sensor (FREESTYLE LIBRE 2 SENSOR) MISC CHECK GLUCOSE EVERY DAY BEFORE A MEAL AND EVERY NIGHT AT BEDTIME 6 each 3  . insulin aspart (NOVOLOG FLEXPEN) 100 UNIT/ML FlexPen Inject into the skin 3 (three) times daily with meals.    . Insulin Pen Needle 32G X 4 MM MISC by Does not apply route.    Marland Kitchen levothyroxine (SYNTHROID) 88 MCG tablet Take 1 tablet (88 mcg total) by mouth daily before breakfast. Patient is due for thyroid test. Please call office to set up lab visit. Thanks Dr. Tobie Poet 30 tablet 0  . LORazepam (ATIVAN) 0.5 MG tablet TAKE 1 TABLET(0.5 MG) BY MOUTH DAILY AS NEEDED FOR ANXIETY 30 tablet 0  . ramipril (ALTACE) 10 MG capsule TAKE 1 CAPSULE(10 MG) BY MOUTH DAILY 90  capsule 0  . rosuvastatin (CRESTOR) 40 MG tablet Take 40 mg by mouth daily.    . TRESIBA FLEXTOUCH 200 UNIT/ML SOPN ADMINISTER 28 UNITS UNDER THE SKIN DAILY 15 mL 1   No facility-administered medications prior to visit.    Allergies  Allergen Reactions  . Ciprofloxacin     Review of Systems  Constitutional: Negative for chills, fatigue and fever.  HENT: Negative for congestion, ear pain, rhinorrhea and sore throat.   Respiratory: Negative for cough and shortness of breath.   Cardiovascular: Negative for chest pain.  Gastrointestinal: Positive for abdominal pain and constipation. Negative for diarrhea, nausea and vomiting.       Objective:    Physical Exam Vitals reviewed.  Constitutional:      Appearance: Normal appearance. She is normal weight.  Cardiovascular:     Rate and Rhythm: Normal rate and regular rhythm.     Pulses: Normal pulses.     Heart sounds: Normal heart sounds.  Pulmonary:     Effort: Pulmonary effort is normal. No respiratory distress.     Breath sounds: Normal breath sounds.  Abdominal:     General: Abdomen is flat. Bowel sounds are normal. There is no distension.     Palpations: Abdomen is soft. There is no mass.     Tenderness: There is abdominal tenderness (RLQ. ). There is no guarding or rebound.     Hernia: No hernia is present.  Neurological:     Mental Status: She is alert and oriented to person, place, and time.  Psychiatric:        Mood and Affect: Mood normal.        Behavior: Behavior normal.     BP 120/60   Pulse 73   Temp (!) 97.5 F (36.4 C)   Ht 5' (1.524 m)   Wt 112 lb (50.8 kg)   SpO2 100%   BMI 21.87 kg/m  Wt Readings from Last 3 Encounters:  04/15/20 112 lb (50.8 kg)  12/25/19 113 lb (51.3 kg)    Health Maintenance Due  Topic Date Due  . Hepatitis C Screening  Never done  . PNEUMOCOCCAL POLYSACCHARIDE VACCINE AGE 77-64 HIGH RISK  Never done  . FOOT EXAM  Never done  . OPHTHALMOLOGY EXAM  Never done  . COVID-19  Vaccine (1) Never done  . HIV Screening  Never done  . TETANUS/TDAP  Never done  . PAP SMEAR-Modifier  Never done  . COLONOSCOPY  Never done  .  MAMMOGRAM  12/25/2009  . INFLUENZA VACCINE  04/13/2020    There are no preventive care reminders to display for this patient.   Lab Results  Component Value Date   TSH 0.152 (L) 12/25/2019   Lab Results  Component Value Date   WBC 10.8 12/25/2019   HGB 16.2 (H) 12/25/2019   HCT 48.6 (H) 12/25/2019   MCV 91 12/25/2019   PLT 262 12/25/2019   Lab Results  Component Value Date   NA 141 12/25/2019   K 4.5 12/25/2019   CO2 25 12/25/2019   GLUCOSE 41 (L) 12/25/2019   BUN 12 12/25/2019   CREATININE 0.70 04/15/2020   BILITOT 0.2 12/25/2019   ALKPHOS 75 12/25/2019   AST 23 12/25/2019   ALT 12 12/25/2019   PROT 6.6 12/25/2019   ALBUMIN 4.7 12/25/2019   CALCIUM 10.4 (H) 12/25/2019   Lab Results  Component Value Date   CHOL 240 (H) 12/25/2019   Lab Results  Component Value Date   HDL 60 12/25/2019   Lab Results  Component Value Date   LDLCALC 162 (H) 12/25/2019   Lab Results  Component Value Date   TRIG 101 12/25/2019   Lab Results  Component Value Date   CHOLHDL 4.0 12/25/2019   Lab Results  Component Value Date   HGBA1C 6.3 (H) 12/25/2019       Assessment & Plan:  1. Right lower quadrant abdominal pain Rule out appendicitis. Order stat labs and ct scan. - CBC with Differential/Platelet - Comprehensive metabolic panel - CT Abdomen Pelvis W Contrast; Future  2. Other constipation   See if contrast improves situation. Consider miralax if does not improve. Follow-up: No follow-ups on file.  An After Visit Summary was printed and given to the patient.  Rochel Brome [facility] [phone]

## 2020-04-15 ENCOUNTER — Encounter: Payer: Self-pay | Admitting: Family Medicine

## 2020-04-15 ENCOUNTER — Other Ambulatory Visit: Payer: Self-pay

## 2020-04-15 ENCOUNTER — Ambulatory Visit: Payer: BC Managed Care – PPO | Admitting: Family Medicine

## 2020-04-15 ENCOUNTER — Ambulatory Visit (HOSPITAL_COMMUNITY)
Admission: RE | Admit: 2020-04-15 | Discharge: 2020-04-15 | Disposition: A | Discharge status: Home / Self Care | Payer: BC Managed Care – PPO | Source: Ambulatory Visit | Attending: Family Medicine | Admitting: Family Medicine

## 2020-04-15 VITALS — BP 120/60 | HR 73 | Temp 97.5°F | Ht 60.0 in | Wt 112.0 lb

## 2020-04-15 DIAGNOSIS — R1031 Right lower quadrant pain: Secondary | ICD-10-CM

## 2020-04-15 DIAGNOSIS — K5909 Other constipation: Secondary | ICD-10-CM

## 2020-04-15 LAB — POCT I-STAT CREATININE: Creatinine, Ser: 0.7 mg/dL (ref 0.44–1.00)

## 2020-04-15 MED ORDER — IOHEXOL 300 MG/ML  SOLN
100.0000 mL | Freq: Once | INTRAMUSCULAR | Status: AC | PRN
  Administered 2020-04-15: 100 mL via INTRAVENOUS

## 2020-04-20 ENCOUNTER — Encounter: Payer: Self-pay | Admitting: Family Medicine

## 2020-04-22 ENCOUNTER — Other Ambulatory Visit: Payer: Self-pay | Admitting: Family Medicine

## 2020-04-25 ENCOUNTER — Other Ambulatory Visit: Payer: Self-pay | Admitting: Family Medicine

## 2020-04-27 ENCOUNTER — Other Ambulatory Visit: Payer: Self-pay | Admitting: Physician Assistant

## 2020-05-02 ENCOUNTER — Other Ambulatory Visit: Payer: Self-pay

## 2020-05-02 ENCOUNTER — Ambulatory Visit: Payer: BC Managed Care – PPO | Admitting: Family Medicine

## 2020-05-02 VITALS — BP 140/72 | HR 74 | Temp 97.2°F | Resp 16 | Ht 60.0 in | Wt 112.6 lb

## 2020-05-02 DIAGNOSIS — E038 Other specified hypothyroidism: Secondary | ICD-10-CM

## 2020-05-02 DIAGNOSIS — E10649 Type 1 diabetes mellitus with hypoglycemia without coma: Secondary | ICD-10-CM | POA: Diagnosis not present

## 2020-05-02 DIAGNOSIS — J4520 Mild intermittent asthma, uncomplicated: Secondary | ICD-10-CM | POA: Diagnosis not present

## 2020-05-02 DIAGNOSIS — E782 Mixed hyperlipidemia: Secondary | ICD-10-CM | POA: Diagnosis not present

## 2020-05-02 NOTE — Progress Notes (Signed)
Established Patient Office Visit  Subjective:  Patient ID: Nicole Marquez, female    DOB: [birth year]  Age: 59 y.o. MRN: [id number]  CC:  Chief Complaint  Patient presents with  . Diabetes    HPI Nicole Marquez presents with type 1 diabetes mellitus on long term insulin.  Compliance with treatment has been good; she takes her medication as directed, maintains her diet and exercise regimen, follows up as directed, and is keeping a glucose diary.  Date of diagnosis 76.  Patient's diabetes was first diagnosed 50 years ago.  She follows a prescribed diet.  She denies experiencing any diabetes related symptoms.  Current meds include insulin/injectable ( NovoLog 3 U before each meal; Tresiba 28 U daily) and Altace 10 mg daily.  The hypoglycemic episodes occur about once a week with blood sugars recorded in 60s.  She reports home blood glucose readings 80s -150s. She has free style libre.  In regard to preventative care, she performs foot self-exams daily. She sees the eye doctor regularly for macular degeneration which is improving with injections.    With regard to the other specified hypothyroidism, she cannot recall when the diagnosis of was made.  She is currently taking Synthroid, 88 mcg daily.    Hyperlipidemia: Patient is on crestor 40 mg once daily. She eats healthy and exercises.  Past Medical History:  Diagnosis Date  . Exudative age-related macular degeneration, bilateral, stage unspecified ([place])   . Family history of malignant neoplasm of digestive organs   . Mixed hyperlipidemia   . Other specified hypothyroidism   . Type 1 diabetes mellitus with hypoglycemia without coma [facility])     Past Surgical History:  Procedure Laterality Date  . CESAREAN SECTION     x3  . ENDOMETRIAL ABLATION    . Right rotator cuff repair      Family History  Problem Relation Age of Onset  . Colon cancer Brother   . Cancer Other   . Anuerysm Other   . Diabetes type II Other     Social  History   Socioeconomic History  . Marital status: Married    Spouse name: Not on file  . Number of children: 4  . Years of education: Not on file  . Highest education level: Not on file  Occupational History  . Not on file  Tobacco Use  . Smoking status: Former Smoker    Types: Cigarettes    Quit date: 2008    Years since quitting: 13.6  . Smokeless tobacco: Never Used  Vaping Use  . Vaping Use: Never used  Substance and Sexual Activity  . Alcohol use: Never  . Drug use: Never  . Sexual activity: Not on file  Other Topics Concern  . Not on file  Social History Narrative   Patient has 1 set of twins and two individual children.   Social Determinants of Health   Financial Resource Strain:   . Difficulty of Paying Living Expenses: Not on file  Food Insecurity:   . Worried About Charity fundraiser in the Last Year: Not on file  . Ran Out of Food in the Last Year: Not on file  Transportation Needs:   . Lack of Transportation (Medical): Not on file  . Lack of Transportation (Non-Medical): Not on file  Physical Activity:   . Days of Exercise per Week: Not on file  . Minutes of Exercise per Session: Not on file  Stress:   . Feeling of Stress :  Not on file  Social Connections:   . Frequency of Communication with Friends and Family: Not on file  . Frequency of Social Gatherings with Friends and Family: Not on file  . Attends Religious Services: Not on file  . Active Member of Clubs or Organizations: Not on file  . Attends Archivist Meetings: Not on file  . Marital Status: Not on file  Intimate Partner Violence:   . Fear of Current or Ex-Partner: Not on file  . Emotionally Abused: Not on file  . Physically Abused: Not on file  . Sexually Abused: Not on file    Outpatient Medications Prior to Visit  Medication Sig Dispense Refill  . albuterol (VENTOLIN HFA) 108 (90 Base) MCG/ACT inhaler INHALE 1 TO 2 PUFFS EVERY 4 HOURS AS NEEDED FOR WHEEZING 8.5 g 2  .  Continuous Blood Gluc Sensor (FREESTYLE LIBRE 2 SENSOR) MISC CHECK GLUCOSE EVERY DAY BEFORE A MEAL AND EVERY NIGHT AT BEDTIME 6 each 3  . insulin aspart (NOVOLOG FLEXPEN) 100 UNIT/ML FlexPen Inject into the skin 3 (three) times daily with meals.    . Insulin Pen Needle 32G X 4 MM MISC by Does not apply route.    Marland Kitchen levothyroxine (SYNTHROID) 88 MCG tablet TAKE 1 TABLET(88 MCG) BY MOUTH DAILY BEFORE BREAKFAST. DUE FOR THYROID TEST. CALL FOR APPT 30 tablet 0  . LORazepam (ATIVAN) 0.5 MG tablet TAKE 1 TABLET(0.5 MG) BY MOUTH DAILY AS NEEDED FOR ANXIETY 30 tablet 1  . ramipril (ALTACE) 10 MG capsule TAKE 1 CAPSULE(10 MG) BY MOUTH DAILY 90 capsule 0  . rosuvastatin (CRESTOR) 40 MG tablet Take 40 mg by mouth daily.    . TRESIBA FLEXTOUCH 200 UNIT/ML SOPN ADMINISTER 28 UNITS UNDER THE SKIN DAILY 15 mL 1   No facility-administered medications prior to visit.    Allergies  Allergen Reactions  . Ciprofloxacin     ROS Review of Systems  Constitutional: Negative for chills, fatigue and fever.  HENT: Negative for congestion, rhinorrhea and sore throat.   Respiratory: Negative for cough and shortness of breath.   Cardiovascular: Negative for chest pain.  Gastrointestinal: Negative for abdominal pain, constipation, diarrhea, nausea and vomiting.  Genitourinary: Negative for dysuria and urgency.  Musculoskeletal: Positive for arthralgias, back pain and myalgias.       Occasional symptoms.  Neurological: Negative for dizziness, weakness, light-headedness and headaches.  Psychiatric/Behavioral: Negative for dysphoric mood. The patient is not nervous/anxious.       Objective:    Physical Exam Constitutional:      Appearance: She is well-developed.  Neck:     Vascular: No carotid bruit.  Cardiovascular:     Rate and Rhythm: Normal rate and regular rhythm.     Pulses: Normal pulses.     Heart sounds: Normal heart sounds.  Pulmonary:     Effort: Pulmonary effort is normal.     Breath sounds:  Normal breath sounds.  Abdominal:     General: Bowel sounds are normal.     Palpations: Abdomen is soft.     Tenderness: There is no abdominal tenderness.  Neurological:     Mental Status: She is oriented to person, place, and time.  Psychiatric:        Mood and Affect: Mood normal.        Behavior: Behavior normal.    Diabetic Foot Exam - Simple   Simple Foot Form Diabetic Foot exam was performed with the following findings: Yes 05/02/2020  3:52 PM  Visual Inspection  No deformities, no ulcerations, no other skin breakdown bilaterally: Yes Sensation Testing Intact to touch and monofilament testing bilaterally: Yes Pulse Check Posterior Tibialis and Dorsalis pulse intact bilaterally: Yes Comments    BP 140/72   Pulse 74   Temp (!) 97.2 F (36.2 C)   Resp 16   Ht 5' (1.524 m)   Wt 112 lb 9.6 oz (51.1 kg)   BMI 21.99 kg/m  Wt Readings from Last 3 Encounters:  05/02/20 112 lb 9.6 oz (51.1 kg)  04/15/20 112 lb (50.8 kg)  12/25/19 113 lb (51.3 kg)   Health Maintenance Due  Topic Date Due  . Hepatitis C Screening  Never done  . PNEUMOCOCCAL POLYSACCHARIDE VACCINE AGE 8-64 HIGH RISK  Never done  . OPHTHALMOLOGY EXAM  Never done  . COVID-19 Vaccine (1) Never done  . HIV Screening  Never done  . TETANUS/TDAP  Never done  . PAP SMEAR-Modifier  Never done  . COLONOSCOPY  Never done  . MAMMOGRAM  12/25/2009  . INFLUENZA VACCINE  04/13/2020    There are no preventive care reminders to display for this patient.  Lab Results  Component Value Date   TSH 0.564 05/02/2020   Lab Results  Component Value Date   WBC 9.4 05/02/2020   HGB 16.7 (H) 05/02/2020   HCT 49.4 (H) 05/02/2020   MCV 92 05/02/2020   PLT 248 05/02/2020   Lab Results  Component Value Date   NA 139 05/02/2020   K 4.5 05/02/2020   CO2 24 05/02/2020   GLUCOSE 83 05/02/2020   BUN 13 05/02/2020   CREATININE 0.81 05/02/2020   BILITOT 0.4 05/02/2020   ALKPHOS 76 05/02/2020   AST 17 05/02/2020   ALT 13  05/02/2020   PROT 6.8 05/02/2020   ALBUMIN 4.9 (H) 05/02/2020   CALCIUM 10.1 05/02/2020   Lab Results  Component Value Date   CHOL 296 (H) 05/02/2020   Lab Results  Component Value Date   HDL 67 05/02/2020   Lab Results  Component Value Date   LDLCALC 208 (H) 05/02/2020   Lab Results  Component Value Date   TRIG 118 05/02/2020   Lab Results  Component Value Date   CHOLHDL 4.4 05/02/2020   Lab Results  Component Value Date   HGBA1C 6.4 (H) 05/02/2020      Assessment & Plan:   1. Other specified hypothyroidism The current medical regimen is effective;  continue present plan and medications. - TSH  2. Type 1 diabetes mellitus with hypoglycemia without coma (HCC) Control: great Recommend check sugars before meals and before bedtime. Recommend check feet daily. Recommend annual eye exams. Medicines: continue current medicines. Continue to work on eating a healthy diet and exercise.  Labs drawn today.   - CBC with Differential/Platelet - Comprehensive metabolic panel - Hemoglobin A1c - POCT UA - Microalbumin  3. Mixed hyperlipidemia Well controlled.  No changes to medicines.  Continue to work on eating a healthy diet and exercise.  Labs drawn today.  - Lipid panel  4. Mild intermittent asthma without complication The current medical regimen is effective;  continue present plan and medications.   Follow-up: No follow-ups on file.    Rochel Brome, MD

## 2020-05-03 LAB — CBC WITH DIFFERENTIAL/PLATELET
Basophils Absolute: 0.1 10*3/uL (ref 0.0–0.2)
Basos: 1 %
EOS (ABSOLUTE): 0.1 10*3/uL (ref 0.0–0.4)
Eos: 2 %
Hematocrit: 49.4 % — ABNORMAL HIGH (ref 34.0–46.6)
Hemoglobin: 16.7 g/dL — ABNORMAL HIGH (ref 11.1–15.9)
Immature Grans (Abs): 0 10*3/uL (ref 0.0–0.1)
Immature Granulocytes: 0 %
Lymphocytes Absolute: 3.8 10*3/uL — ABNORMAL HIGH (ref 0.7–3.1)
Lymphs: 40 %
MCH: 30.9 pg (ref 26.6–33.0)
MCHC: 33.8 g/dL (ref 31.5–35.7)
MCV: 92 fL (ref 79–97)
Monocytes Absolute: 0.6 10*3/uL (ref 0.1–0.9)
Monocytes: 6 %
Neutrophils Absolute: 4.8 10*3/uL (ref 1.4–7.0)
Neutrophils: 51 %
Platelets: 248 10*3/uL (ref 150–450)
RBC: 5.4 x10E6/uL — ABNORMAL HIGH (ref 3.77–5.28)
RDW: 12 % (ref 11.7–15.4)
WBC: 9.4 10*3/uL (ref 3.4–10.8)

## 2020-05-03 LAB — COMPREHENSIVE METABOLIC PANEL
ALT: 13 IU/L (ref 0–32)
AST: 17 IU/L (ref 0–40)
Albumin/Globulin Ratio: 2.6 — ABNORMAL HIGH (ref 1.2–2.2)
Albumin: 4.9 g/dL — ABNORMAL HIGH (ref 3.8–4.8)
Alkaline Phosphatase: 76 IU/L (ref 48–121)
BUN/Creatinine Ratio: 16 (ref 12–28)
BUN: 13 mg/dL (ref 8–27)
Bilirubin Total: 0.4 mg/dL (ref 0.0–1.2)
CO2: 24 mmol/L (ref 20–29)
Calcium: 10.1 mg/dL (ref 8.7–10.3)
Chloride: 101 mmol/L (ref 96–106)
Creatinine, Ser: 0.81 mg/dL (ref 0.57–1.00)
GFR calc Af Amer: 91 mL/min/{1.73_m2} (ref >59)
GFR calc non Af Amer: 79 mL/min/{1.73_m2} (ref >59)
Globulin, Total: 1.9 g/dL (ref 1.5–4.5)
Glucose: 83 mg/dL (ref 65–99)
Potassium: 4.5 mmol/L (ref 3.5–5.2)
Sodium: 139 mmol/L (ref 134–144)
Total Protein: 6.8 g/dL (ref 6.0–8.5)

## 2020-05-03 LAB — LIPID PANEL
Chol/HDL Ratio: 4.4 ratio (ref 0.0–4.4)
Cholesterol, Total: 296 mg/dL — ABNORMAL HIGH (ref 100–199)
HDL: 67 mg/dL (ref >39)
LDL Chol Calc (NIH): 208 mg/dL — ABNORMAL HIGH (ref 0–99)
Triglycerides: 118 mg/dL (ref 0–149)
VLDL Cholesterol Cal: 21 mg/dL (ref 5–40)

## 2020-05-03 LAB — HEMOGLOBIN A1C
Est. average glucose Bld gHb Est-mCnc: 137 mg/dL
Hgb A1c MFr Bld: 6.4 % — ABNORMAL HIGH (ref 4.8–5.6)

## 2020-05-03 LAB — TSH: TSH: 0.564 u[IU]/mL (ref 0.450–4.500)

## 2020-05-03 LAB — CARDIOVASCULAR RISK ASSESSMENT

## 2020-05-04 ENCOUNTER — Encounter: Payer: Self-pay | Admitting: Family Medicine

## 2020-05-04 LAB — POCT UA - MICROALBUMIN: Microalbumin Ur, POC: 80 mg/L

## 2020-05-06 ENCOUNTER — Other Ambulatory Visit: Payer: Self-pay | Admitting: Family Medicine

## 2020-05-25 ENCOUNTER — Other Ambulatory Visit: Payer: Self-pay | Admitting: Family Medicine

## 2020-06-10 ENCOUNTER — Other Ambulatory Visit: Payer: Self-pay | Admitting: Family Medicine

## 2020-06-22 ENCOUNTER — Other Ambulatory Visit: Payer: Self-pay | Admitting: Physician Assistant

## 2020-06-23 ENCOUNTER — Other Ambulatory Visit: Payer: Self-pay | Admitting: Family Medicine

## 2020-06-24 ENCOUNTER — Other Ambulatory Visit: Payer: Self-pay | Admitting: Family Medicine

## 2020-07-27 ENCOUNTER — Other Ambulatory Visit: Payer: Self-pay | Admitting: Physician Assistant

## 2020-07-28 ENCOUNTER — Other Ambulatory Visit: Payer: Self-pay | Admitting: Family Medicine

## 2020-07-28 MED ORDER — LEVOTHYROXINE SODIUM 88 MCG PO TABS: 88.0000 ug | ORAL_TABLET | Freq: Every day | ORAL | 3 refills | Status: DC

## 2020-08-22 ENCOUNTER — Other Ambulatory Visit: Payer: Self-pay | Admitting: Family Medicine

## 2020-08-25 ENCOUNTER — Other Ambulatory Visit: Payer: Self-pay

## 2020-09-09 ENCOUNTER — Other Ambulatory Visit: Payer: Self-pay | Admitting: Family Medicine

## 2020-09-13 DIAGNOSIS — C50919 Malignant neoplasm of unspecified site of unspecified female breast: Secondary | ICD-10-CM

## 2020-09-13 HISTORY — DX: Malignant neoplasm of unspecified site of unspecified female breast: C50.919

## 2020-10-01 ENCOUNTER — Other Ambulatory Visit: Payer: Self-pay | Admitting: Family Medicine

## 2020-10-22 ENCOUNTER — Other Ambulatory Visit: Payer: Self-pay

## 2020-10-22 ENCOUNTER — Other Ambulatory Visit: Payer: Self-pay | Admitting: Family Medicine

## 2020-10-22 MED ORDER — LORAZEPAM 0.5 MG PO TABS: ORAL_TABLET | ORAL | 0 refills | Status: DC

## 2020-10-22 NOTE — Telephone Encounter (Signed)
Needs appointment before refilled again.

## 2020-10-28 ENCOUNTER — Other Ambulatory Visit: Payer: Self-pay

## 2020-10-28 MED ORDER — ALBUTEROL SULFATE HFA 108 (90 BASE) MCG/ACT IN AERS: INHALATION_SPRAY | RESPIRATORY_TRACT | 2 refills | Status: DC

## 2020-10-28 MED ORDER — INSULIN PEN NEEDLE 32G X 4 MM MISC: 1.0000 | Freq: Four times a day (QID) | 1 refills | Status: DC

## 2020-10-30 ENCOUNTER — Other Ambulatory Visit: Payer: Self-pay

## 2020-11-03 ENCOUNTER — Other Ambulatory Visit: Payer: Self-pay | Admitting: Family Medicine

## 2020-11-05 ENCOUNTER — Other Ambulatory Visit: Payer: Self-pay

## 2020-11-05 ENCOUNTER — Other Ambulatory Visit: Payer: Self-pay | Admitting: Family Medicine

## 2020-11-05 MED ORDER — TRESIBA FLEXTOUCH 200 UNIT/ML ~~LOC~~ SOPN: PEN_INJECTOR | SUBCUTANEOUS | 0 refills | Status: DC

## 2020-11-05 MED ORDER — FREESTYLE LIBRE 2 SENSOR MISC: 3 refills | Status: DC

## 2020-11-05 NOTE — Telephone Encounter (Signed)
Husband calling requesting refills for freestyle and tresiba. Pharmacy: [facility] in [place].   Nicole Marquez, Nicole Marquez 11/05/20 11:03 AM

## 2020-11-18 ENCOUNTER — Telehealth: Payer: Self-pay

## 2020-11-18 ENCOUNTER — Other Ambulatory Visit: Payer: Self-pay | Admitting: Family Medicine

## 2020-11-18 NOTE — Telephone Encounter (Signed)
Pt's husband calling for prescription of Dexcom sensor. They need 90 day supply. They were given sample of dexcom. Stated they do not need receiver just the transmitter and sensor. Please advise. Insurance switched from freestyle to dexcom.   Royce Macadamia, [place] 11/18/20 12:18 PM

## 2020-11-19 ENCOUNTER — Other Ambulatory Visit: Payer: Self-pay | Admitting: Family Medicine

## 2020-11-19 MED ORDER — DEXCOM G6 SENSOR MISC
3 refills | Status: DC
Start: 2020-11-19 — End: 2021-01-12

## 2020-11-19 NOTE — Telephone Encounter (Signed)
Sent. Pt needs an appointment fasting.

## 2020-11-20 ENCOUNTER — Other Ambulatory Visit: Payer: Self-pay

## 2020-11-20 DIAGNOSIS — E10649 Type 1 diabetes mellitus with hypoglycemia without coma: Secondary | ICD-10-CM

## 2020-11-20 DIAGNOSIS — E782 Mixed hyperlipidemia: Secondary | ICD-10-CM

## 2020-11-20 DIAGNOSIS — E038 Other specified hypothyroidism: Secondary | ICD-10-CM

## 2020-11-20 MED ORDER — DEXCOM G6 TRANSMITTER MISC: 4 refills | Status: DC

## 2020-11-20 NOTE — Addendum Note (Signed)
Addended by: Jackie Plum C on: 11/20/2020 11:53 AM   Modules accepted: Orders

## 2020-11-20 NOTE — Telephone Encounter (Signed)
Attempted to call to make appointment but no answer. Husband also left VM stating the sensors were sent but they also need the transmitter. States pharmacy will not fill until transmitter is also sent.   Transmitter sent.   Royce Macadamia, [place] 11/20/20 11:53 AM

## 2020-11-24 ENCOUNTER — Other Ambulatory Visit: Payer: BC Managed Care – PPO

## 2020-11-24 ENCOUNTER — Other Ambulatory Visit: Payer: Self-pay

## 2020-11-24 NOTE — Progress Notes (Signed)
Subjective:  Patient ID: Nicole Marquez, female    DOB: 08/23/1961  Age: 60 y.o. MRN: [id number]  Chief Complaint  Patient presents with  . Diabetes  . Hypertension    HPI Other specified hypothyroidism Takes synthroid 88 mcg once daily. Most recent tsh was normal.   Mixed hyperlipidemia Poorly controlled with crestor 40 mg once daily at night. She is not taking it every night.  Eats health. Exercises regularly.  Mild intermittent asthma without complication Uses ventolin as needed. Spar[place].  Type 1 diabetes mellitus with hypoglycemia without coma (HCC) Controlled with novolog and tresiba 28 U daily. Uses novolog before meal . Sugars 50-250. Higher ones are usually postprandial. Has dexcom. No neuropathy. Eating healthy and exercising.  On altace 10 mg once daily for prevention of nephropathy.    Current Outpatient Medications on File Prior to Visit  Medication Sig Dispense Refill  . albuterol (VENTOLIN HFA) 108 (90 Base) MCG/ACT inhaler INHALE 1 TO 2 PUFFS EVERY 4 HOURS AS NEEDED FOR WHEEZING 8.5 g 2  . Continuous Blood Gluc Sensor (DEXCOM G6 SENSOR) MISC Check sugars qac and qhs. 9 each 3  . Continuous Blood Gluc Transmit (DEXCOM G6 TRANSMITTER) MISC Change transmitter every 3 months DX CODE:  E10.649 1 each 4  . insulin aspart (NOVOLOG FLEXPEN) 100 UNIT/ML FlexPen Inject into the skin 3 (three) times daily with meals.    . Insulin Pen Needle 32G X 4 MM MISC 1 each by Does not apply route 4 (four) times daily. 200 each 1  . levothyroxine (SYNTHROID) 88 MCG tablet Take 1 tablet (88 mcg total) by mouth daily. 90 tablet 3  . LORazepam (ATIVAN) 0.5 MG tablet TAKE 1 TABLET(0.5 MG) BY MOUTH DAILY AS NEEDED FOR ANXIETY 30 tablet 0  . ramipril (ALTACE) 10 MG capsule TAKE 1 CAPSULE(10 MG) BY MOUTH DAILY 90 capsule 0  . rosuvastatin (CRESTOR) 40 MG tablet Take 40 mg by mouth daily.    . TRESIBA FLEXTOUCH 200 UNIT/ML FlexTouch Pen ADMINISTER 28 UNITS UNDER THE SKIN DAILY 6 mL 0    No current facility-administered medications on file prior to visit.   Past Medical History:  Diagnosis Date  . Exudative age-related macular degeneration, bilateral, stage unspecified ([place])   . Family history of malignant neoplasm of digestive organs   . Mixed hyperlipidemia   . Other specified hypothyroidism   . Type 1 diabetes mellitus with hypoglycemia without coma [facility])    Past Surgical History:  Procedure Laterality Date  . CESAREAN SECTION     x3  . ENDOMETRIAL ABLATION    . Right rotator cuff repair      Family History  Problem Relation Age of Onset  . Colon cancer Brother 45  . Diabetes Father   . Kidney failure Sister    Social History   Socioeconomic History  . Marital status: Married    Spouse name: Not on file  . Number of children: 4  . Years of education: Not on file  . Highest education level: Not on file  Occupational History  . Not on file  Tobacco Use  . Smoking status: Former Smoker    Types: Cigarettes    Quit date: 2008    Years since quitting: 14.2  . Smokeless tobacco: Never Used  Vaping Use  . Vaping Use: Never used  Substance and Sexual Activity  . Alcohol use: Never  . Drug use: Never  . Sexual activity: Not on file  Other Topics Concern  . Not  on file  Social History Narrative   Patient has 1 set of twins and two individual children.   Social Determinants of Health   Financial Resource Strain: Not on file  Food Insecurity: Not on file  Transportation Needs: Not on file  Physical Activity: Not on file  Stress: Not on file  Social Connections: Not on file    Review of Systems  Constitutional: Negative for chills, fatigue and fever.  HENT: Negative for congestion, ear pain, rhinorrhea and sore throat.   Respiratory: Negative for cough and shortness of breath.   Cardiovascular: Negative for chest pain.  Gastrointestinal: Negative for abdominal pain, constipation, diarrhea, nausea and vomiting.  Endocrine: Negative for  polydipsia, polyphagia and polyuria.  Genitourinary: Negative for dysuria and urgency.  Musculoskeletal: Negative for back pain and myalgias.  Neurological: Positive for headaches. Negative for dizziness, weakness and light-headedness.  Psychiatric/Behavioral: Negative for dysphoric mood. The patient is not nervous/anxious.      Objective:  BP 118/60   Pulse 84   Temp (!) 97.4 F (36.3 C)   Resp 18   Ht 5' (1.524 m)   Wt 122 lb (55.3 kg)   BMI 23.83 kg/m   BP/Weight 11/25/2020 5/39/7673 12/12/9377  Systolic BP 024 097 353  Diastolic BP 60 72 60  Wt. (Lbs) 122 112.6 112  BMI 23.83 21.99 21.87    Physical Exam Vitals reviewed.  Constitutional:      Appearance: Normal appearance. She is normal weight.  Neck:     Vascular: No carotid bruit.  Cardiovascular:     Rate and Rhythm: Normal rate and regular rhythm.     Pulses: Normal pulses.     Heart sounds: Normal heart sounds.  Pulmonary:     Effort: Pulmonary effort is normal. No respiratory distress.     Breath sounds: Normal breath sounds.  Abdominal:     General: Abdomen is flat. Bowel sounds are normal.     Palpations: Abdomen is soft.     Tenderness: There is no abdominal tenderness.  Neurological:     Mental Status: She is alert and oriented to person, place, and time.  Psychiatric:        Mood and Affect: Mood normal.        Behavior: Behavior normal.     Diabetic Foot Exam - Simple   Simple Foot Form Diabetic Foot exam was performed with the following findings: Yes 11/25/2020  2:54 PM  Visual Inspection See comments: Yes Sensation Testing Intact to touch and monofilament testing bilaterally: Yes Pulse Check Posterior Tibialis and Dorsalis pulse intact bilaterally: Yes Comments Calluses lateral to 5th mtp bl.       Lab Results  Component Value Date   WBC 7.8 11/24/2020   HGB 14.3 11/24/2020   HCT 43.6 11/24/2020   PLT 228 11/24/2020   GLUCOSE 86 11/24/2020   CHOL 326 (H) 11/24/2020   TRIG 103  11/24/2020   HDL 63 11/24/2020   LDLCALC 246 (H) 11/24/2020   ALT 14 11/24/2020   AST 18 11/24/2020   NA 142 11/24/2020   K 4.5 11/24/2020   CL 103 11/24/2020   CREATININE 0.85 11/24/2020   BUN 15 11/24/2020   CO2 24 11/24/2020   TSH 0.465 11/24/2020   HGBA1C 6.8 (H) 11/24/2020   MICROALBUR 80 05/04/2020      Assessment & Plan:   1. Other specified hypothyroidism The current medical regimen is effective;  continue present plan and medications.  2. Mixed hyperlipidemia Very poorly controlled.  At high risk for heart disease.  Increase crestor to 40 mg once daily.   3. Mild intermittent asthma without complication Continue albuterol mdi 2 puffs qid prn.   4. Type 1 diabetes mellitus with hypoglycemia without coma (HCC) Control: at goal Recommend check sugars fasting qac and qhs. Recommend check feet daily. Recommend annual eye exams. Medicines: no changes Continue to work on eating a healthy diet and exercise.   Follow-up: Return in about 3 months (around 02/25/2021) for fasting.Marland Kitchen  An After Visit Summary was printed and given to the patient.  Rochel Brome, MD [facility] [phone]

## 2020-11-25 ENCOUNTER — Encounter: Payer: Self-pay | Admitting: Family Medicine

## 2020-11-25 ENCOUNTER — Ambulatory Visit: Payer: BC Managed Care – PPO | Admitting: Family Medicine

## 2020-11-25 VITALS — BP 118/60 | HR 84 | Temp 97.4°F | Resp 18 | Ht 60.0 in | Wt 122.0 lb

## 2020-11-25 DIAGNOSIS — E10649 Type 1 diabetes mellitus with hypoglycemia without coma: Secondary | ICD-10-CM

## 2020-11-25 DIAGNOSIS — J4520 Mild intermittent asthma, uncomplicated: Secondary | ICD-10-CM

## 2020-11-25 DIAGNOSIS — E038 Other specified hypothyroidism: Secondary | ICD-10-CM

## 2020-11-25 DIAGNOSIS — E782 Mixed hyperlipidemia: Secondary | ICD-10-CM

## 2020-11-25 LAB — CBC WITH DIFFERENTIAL/PLATELET
Basophils Absolute: 0.1 10*3/uL (ref 0.0–0.2)
Basos: 1 %
EOS (ABSOLUTE): 0.2 10*3/uL (ref 0.0–0.4)
Eos: 2 %
Hematocrit: 43.6 % (ref 34.0–46.6)
Hemoglobin: 14.3 g/dL (ref 11.1–15.9)
Immature Grans (Abs): 0 10*3/uL (ref 0.0–0.1)
Immature Granulocytes: 0 %
Lymphocytes Absolute: 3.3 10*3/uL — ABNORMAL HIGH (ref 0.7–3.1)
Lymphs: 42 %
MCH: 29.5 pg (ref 26.6–33.0)
MCHC: 32.8 g/dL (ref 31.5–35.7)
MCV: 90 fL (ref 79–97)
Monocytes Absolute: 0.5 10*3/uL (ref 0.1–0.9)
Monocytes: 6 %
Neutrophils Absolute: 3.8 10*3/uL (ref 1.4–7.0)
Neutrophils: 49 %
Platelets: 228 10*3/uL (ref 150–450)
RBC: 4.85 x10E6/uL (ref 3.77–5.28)
RDW: 11.4 % — ABNORMAL LOW (ref 11.7–15.4)
WBC: 7.8 10*3/uL (ref 3.4–10.8)

## 2020-11-25 LAB — COMPREHENSIVE METABOLIC PANEL
ALT: 14 IU/L (ref 0–32)
AST: 18 IU/L (ref 0–40)
Albumin/Globulin Ratio: 2.8 — ABNORMAL HIGH (ref 1.2–2.2)
Albumin: 4.4 g/dL (ref 3.8–4.8)
Alkaline Phosphatase: 74 IU/L (ref 44–121)
BUN/Creatinine Ratio: 18 (ref 12–28)
BUN: 15 mg/dL (ref 8–27)
Bilirubin Total: 0.2 mg/dL (ref 0.0–1.2)
CO2: 24 mmol/L (ref 20–29)
Calcium: 9.8 mg/dL (ref 8.7–10.3)
Chloride: 103 mmol/L (ref 96–106)
Creatinine, Ser: 0.85 mg/dL (ref 0.57–1.00)
Globulin, Total: 1.6 g/dL (ref 1.5–4.5)
Glucose: 86 mg/dL (ref 65–99)
Potassium: 4.5 mmol/L (ref 3.5–5.2)
Sodium: 142 mmol/L (ref 134–144)
Total Protein: 6 g/dL (ref 6.0–8.5)
eGFR: 77 mL/min/{1.73_m2} (ref >59)

## 2020-11-25 LAB — LIPID PANEL
Chol/HDL Ratio: 5.2 ratio — ABNORMAL HIGH (ref 0.0–4.4)
Cholesterol, Total: 326 mg/dL — ABNORMAL HIGH (ref 100–199)
HDL: 63 mg/dL (ref >39)
LDL Chol Calc (NIH): 246 mg/dL — ABNORMAL HIGH (ref 0–99)
Triglycerides: 103 mg/dL (ref 0–149)
VLDL Cholesterol Cal: 17 mg/dL (ref 5–40)

## 2020-11-25 LAB — POCT UA - MICROALBUMIN: Microalbumin Ur, POC: 30 mg/L

## 2020-11-25 LAB — HEMOGLOBIN A1C
Est. average glucose Bld gHb Est-mCnc: 148 mg/dL
Hgb A1c MFr Bld: 6.8 % — ABNORMAL HIGH (ref 4.8–5.6)

## 2020-11-25 LAB — CARDIOVASCULAR RISK ASSESSMENT

## 2020-11-25 LAB — TSH: TSH: 0.465 u[IU]/mL (ref 0.450–4.500)

## 2020-11-25 NOTE — Patient Instructions (Signed)
Increase crestor to 40 mg once daily.

## 2020-11-30 ENCOUNTER — Encounter: Payer: Self-pay | Admitting: Family Medicine

## 2020-12-22 ENCOUNTER — Other Ambulatory Visit: Payer: Self-pay | Admitting: Family Medicine

## 2020-12-29 ENCOUNTER — Encounter: Payer: Self-pay | Admitting: Nurse Practitioner

## 2020-12-29 ENCOUNTER — Telehealth (INDEPENDENT_AMBULATORY_CARE_PROVIDER_SITE_OTHER): Payer: BC Managed Care – PPO | Admitting: Nurse Practitioner

## 2020-12-29 VITALS — BP 110/70 | Ht 60.0 in | Wt 120.0 lb

## 2020-12-29 DIAGNOSIS — J018 Other acute sinusitis: Secondary | ICD-10-CM

## 2020-12-29 DIAGNOSIS — J4521 Mild intermittent asthma with (acute) exacerbation: Secondary | ICD-10-CM | POA: Diagnosis not present

## 2020-12-29 MED ORDER — HYDROCODONE-HOMATROPINE 5-1.5 MG/5ML PO SYRP: 5.0000 mL | ORAL_SOLUTION | Freq: Four times a day (QID) | ORAL | 0 refills | Status: DC | PRN

## 2020-12-29 MED ORDER — AZITHROMYCIN 250 MG PO TABS: ORAL_TABLET | ORAL | 0 refills | Status: DC

## 2020-12-29 MED ORDER — MONTELUKAST SODIUM 10 MG PO TABS: 10.0000 mg | ORAL_TABLET | Freq: Every day | ORAL | 0 refills | Status: DC

## 2020-12-29 NOTE — Progress Notes (Signed)
Virtual Visit via Telephone Note   This visit type was conducted due to national recommendations for restrictions regarding the COVID-19 Pandemic (e.g. social distancing) in an effort to limit this patient's exposure and mitigate transmission in our community.  Due to her co-morbid illnesses, this patient is at least at moderate risk for complications without adequate follow up.  This format is felt to be most appropriate for this patient at this time.  The patient did not have access to video technology/had technical difficulties with video requiring transitioning to audio format only (telephone).  All issues noted in this document were discussed and addressed.  No physical exam could be performed with this format.  Patient verbally consented to a telehealth visit.   Date:  12/29/2020   ID:  Nicole Marquez, DOB 01-13-1961, MRN [id number]  Patient Location: Home Provider Location: Office/Clinic  PCP:  Rochel Brome, MD   Evaluation Performed:  Established patient, acute telemedicine visit  Chief Complaint:  Cough  History of Present Illness:    Nicole Marquez is a 60 y.o. female with chest tightness and cough. Onset of symptoms was 67-month-ago. Treatment has included Mucus Relief OTC cold remedy, cough drops, "allergy pill", and albuterol inhaler. She tells me she is pushing fluids. States she has a history of asthma and allergic rhinitis related to pollen. She is a type 1 IDDM. States blood glucose levels have been elevated between 200-250.   The patient does have symptoms concerning for COVID-19 infection (fever, chills, cough, or new shortness of breath).    Past Medical History:  Diagnosis Date  . Exudative age-related macular degeneration, bilateral, stage unspecified ([place])   . Family history of malignant neoplasm of digestive organs   . Mixed hyperlipidemia   . Other specified hypothyroidism   . Type 1 diabetes mellitus with hypoglycemia without coma [facility])     Past  Surgical History:  Procedure Laterality Date  . CESAREAN SECTION     x3  . ENDOMETRIAL ABLATION    . Right rotator cuff repair      Family History  Problem Relation Age of Onset  . Colon cancer Brother 44  . Diabetes Father   . Kidney failure Sister     Social History   Socioeconomic History  . Marital status: Married    Spouse name: Not on file  . Number of children: 4  . Years of education: Not on file  . Highest education level: Not on file  Occupational History  . Not on file  Tobacco Use  . Smoking status: Former Smoker    Types: Cigarettes    Quit date: 2008    Years since quitting: 14.3  . Smokeless tobacco: Never Used  Vaping Use  . Vaping Use: Never used  Substance and Sexual Activity  . Alcohol use: Never  . Drug use: Never  . Sexual activity: Not on file  Other Topics Concern  . Not on file  Social History Narrative   Patient has 1 set of twins and two individual children.      Outpatient Medications Prior to Visit  Medication Sig Dispense Refill  . albuterol (VENTOLIN HFA) 108 (90 Base) MCG/ACT inhaler INHALE 1 TO 2 PUFFS EVERY 4 HOURS AS NEEDED FOR WHEEZING 8.5 g 2  . Continuous Blood Gluc Sensor (DEXCOM G6 SENSOR) MISC Check sugars qac and qhs. 9 each 3  . Continuous Blood Gluc Transmit (DEXCOM G6 TRANSMITTER) MISC Change transmitter every 3 months DX CODE:  E10.649 1 each  4  . insulin aspart (NOVOLOG FLEXPEN) 100 UNIT/ML FlexPen Inject into the skin 3 (three) times daily with meals.    . Insulin Pen Needle 32G X 4 MM MISC 1 each by Does not apply route 4 (four) times daily. 200 each 1  . levothyroxine (SYNTHROID) 88 MCG tablet Take 1 tablet (88 mcg total) by mouth daily. 90 tablet 3  . LORazepam (ATIVAN) 0.5 MG tablet TAKE 1 TABLET(0.5 MG) BY MOUTH DAILY AS NEEDED FOR ANXIETY 30 tablet 0  . ramipril (ALTACE) 10 MG capsule TAKE 1 CAPSULE(10 MG) BY MOUTH DAILY 90 capsule 0  . rosuvastatin (CRESTOR) 40 MG tablet Take 40 mg by mouth daily.    .  TRESIBA FLEXTOUCH 200 UNIT/ML FlexTouch Pen ADMINISTER 28 UNITS UNDER THE SKIN DAILY 6 mL 0   No facility-administered medications prior to visit.    Allergies:   Ciprofloxacin and Lipitor [atorvastatin]   Social History   Tobacco Use  . Smoking status: Former Smoker    Types: Cigarettes    Quit date: 2008    Years since quitting: 14.3  . Smokeless tobacco: Never Used  Vaping Use  . Vaping Use: Never used  Substance Use Topics  . Alcohol use: Never  . Drug use: Never     Review of Systems  Constitutional: Positive for fever (low grade 99.80F) and malaise/fatigue. Negative for chills.  HENT: Positive for congestion, ear pain and sore throat (post-nasal-drip).   Eyes: Negative for pain.  Respiratory: Positive for cough (chest tightness).   Cardiovascular: Negative for chest pain and orthopnea.  Gastrointestinal: Negative for abdominal pain, constipation, diarrhea, heartburn, nausea and vomiting.  Genitourinary: Negative for dysuria, frequency and urgency.  Musculoskeletal: Negative for back pain, joint pain and myalgias.  Skin: Negative for rash.  Neurological: Negative for dizziness and headaches.  Endo/Heme/Allergies: Negative for environmental allergies.  Psychiatric/Behavioral: Negative for depression. The patient is not nervous/anxious.      Labs/Other Tests and Data Reviewed:    Recent Labs: 11/24/2020: ALT 14; BUN 15; Creatinine, Ser 0.85; Hemoglobin 14.3; Platelets 228; Potassium 4.5; Sodium 142; TSH 0.465   Recent Lipid Panel Lab Results  Component Value Date/Time   CHOL 326 (H) 11/24/2020 08:27 AM   TRIG 103 11/24/2020 08:27 AM   HDL 63 11/24/2020 08:27 AM   CHOLHDL 5.2 (H) 11/24/2020 08:27 AM   LDLCALC 246 (H) 11/24/2020 08:27 AM    Wt Readings from Last 3 Encounters:  12/29/20 120 lb (54.4 kg)  11/25/20 122 lb (55.3 kg)  05/02/20 112 lb 9.6 oz (51.1 kg)     Objective:    Vital Signs:  BP 110/70   Ht 5' (1.524 m)   Wt 120 lb (54.4 kg)   BMI 23.44  kg/m    Physical Exam No physical exam due to telemedicine visit  ASSESSMENT & PLAN:    1. Acute non-recurrent sinusitis of other sinus - azithromycin (ZITHROMAX) 250 MG tablet; Take two tablets by mouth on day one, take one tablet by mouth on days two-five  Dispense: 6 tablet; Refill: 0  2. Mild intermittent asthma with exacerbation - montelukast (SINGULAIR) 10 MG tablet; Take 1 tablet (10 mg total) by mouth at bedtime.  Dispense: 90 tablet; Refill: 0 - HYDROcodone-homatropine (HYCODAN) 5-1.5 MG/5ML syrup; Take 5 mLs by mouth every 6 (six) hours as needed for cough.  Dispense: 120 mL; Refill: 0  Rest and push fluids Continue OTC allergy medication Seek emergency medical care for severe shortness of breath or any other concerning  symptoms Notify office if no improvement, may need in person visit and/or chest x-ray Continue to monitor blood sugar    COVID-19 Education: The signs and symptoms of COVID-19 were discussed with the patient and how to seek care for testing (follow up with PCP or arrange E-visit). The importance of social distancing was discussed today.  I, Rip Harbour, NP, have reviewed all documentation for this visit. The documentation on 12/29/20 for the exam, diagnosis, procedures, and orders are all accurate and complete.  I spent 15 minutes dedicated to the care of this patient on the date of this encounter to include telephone time with the patient, as well as: EMR review and prescription medication management.   Follow Up:  In Person prn  Signed,  Rip Harbour, NP  12/29/2020 10:32 PM    [place]

## 2021-01-09 ENCOUNTER — Other Ambulatory Visit: Payer: Self-pay | Admitting: Nurse Practitioner

## 2021-01-09 ENCOUNTER — Telehealth: Payer: Self-pay

## 2021-01-09 ENCOUNTER — Other Ambulatory Visit: Payer: Self-pay

## 2021-01-09 DIAGNOSIS — R059 Cough, unspecified: Secondary | ICD-10-CM

## 2021-01-09 MED ORDER — HYDROCODONE BIT-HOMATROP MBR 5-1.5 MG/5ML PO SOLN: 5.0000 mL | Freq: Four times a day (QID) | ORAL | 0 refills | Status: DC | PRN

## 2021-01-09 NOTE — Telephone Encounter (Signed)
Husband calling asking for refill of hycodan syrup as pt is still coughing. Cough wakes pt at night. Cough is better than before. Requesting this so they can get through weekend and they see Dr. Tobie Poet Monday. Please advise. Routing to provider that prescribed and PCP as FYI.   Pharmacy: [facility] if appropriate.

## 2021-01-09 NOTE — Telephone Encounter (Signed)
Attempted to call husband twice with no success. Left VM both times requesting call back as VM does not have identifiers.   Harrell Lark 01/09/21 12:34 PM

## 2021-01-12 ENCOUNTER — Other Ambulatory Visit: Payer: Self-pay | Admitting: Nurse Practitioner

## 2021-01-12 ENCOUNTER — Ambulatory Visit (INDEPENDENT_AMBULATORY_CARE_PROVIDER_SITE_OTHER): Payer: BC Managed Care – PPO | Admitting: Nurse Practitioner

## 2021-01-12 ENCOUNTER — Other Ambulatory Visit: Payer: Self-pay

## 2021-01-12 ENCOUNTER — Encounter: Payer: Self-pay | Admitting: Nurse Practitioner

## 2021-01-12 VITALS — BP 108/62 | HR 72 | Temp 97.9°F | Ht 60.0 in | Wt 124.0 lb

## 2021-01-12 DIAGNOSIS — Z124 Encounter for screening for malignant neoplasm of cervix: Secondary | ICD-10-CM | POA: Diagnosis not present

## 2021-01-12 DIAGNOSIS — Z1231 Encounter for screening mammogram for malignant neoplasm of breast: Secondary | ICD-10-CM | POA: Diagnosis not present

## 2021-01-12 DIAGNOSIS — Z0000 Encounter for general adult medical examination without abnormal findings: Secondary | ICD-10-CM

## 2021-01-12 DIAGNOSIS — E10649 Type 1 diabetes mellitus with hypoglycemia without coma: Secondary | ICD-10-CM

## 2021-01-12 DIAGNOSIS — J4521 Mild intermittent asthma with (acute) exacerbation: Secondary | ICD-10-CM

## 2021-01-12 DIAGNOSIS — J4520 Mild intermittent asthma, uncomplicated: Secondary | ICD-10-CM

## 2021-01-12 DIAGNOSIS — Z1211 Encounter for screening for malignant neoplasm of colon: Secondary | ICD-10-CM

## 2021-01-12 MED ORDER — LEVOTHYROXINE SODIUM 88 MCG PO TABS: 88.0000 ug | ORAL_TABLET | Freq: Every day | ORAL | 3 refills | Status: DC

## 2021-01-12 MED ORDER — MONTELUKAST SODIUM 10 MG PO TABS: 10.0000 mg | ORAL_TABLET | Freq: Every day | ORAL | 0 refills | Status: DC

## 2021-01-12 MED ORDER — ROSUVASTATIN CALCIUM 40 MG PO TABS: 40.0000 mg | ORAL_TABLET | Freq: Every day | ORAL | 0 refills | Status: DC

## 2021-01-12 MED ORDER — DEXCOM G6 SENSOR MISC: 3 refills | Status: DC

## 2021-01-12 MED ORDER — RAMIPRIL 10 MG PO CAPS: ORAL_CAPSULE | ORAL | 0 refills | Status: DC

## 2021-01-12 MED ORDER — DEXCOM G6 TRANSMITTER MISC: 4 refills | Status: DC

## 2021-01-12 MED ORDER — ALBUTEROL SULFATE HFA 108 (90 BASE) MCG/ACT IN AERS: INHALATION_SPRAY | RESPIRATORY_TRACT | 2 refills | Status: DC

## 2021-01-12 MED ORDER — TRESIBA FLEXTOUCH 200 UNIT/ML ~~LOC~~ SOPN: PEN_INJECTOR | SUBCUTANEOUS | 0 refills | Status: DC

## 2021-01-12 NOTE — Patient Instructions (Signed)
We will call you with appointments for colonoscopy, mammogram, and pap smear Continue medications as prescribed Follow-up on 02/16/21 at 0800, fasting, or sooner if needed  Health Maintenance, Female Adopting a healthy lifestyle and getting preventive care are important in promoting health and wellness. Ask your health care provider about:  The right schedule for you to have regular tests and exams.  Things you can do on your own to prevent diseases and keep yourself healthy. What should I know about diet, weight, and exercise? Eat a healthy diet  Eat a diet that includes plenty of vegetables, fruits, low-fat dairy products, and lean protein.  Do not eat a lot of foods that are high in solid fats, added sugars, or sodium.   Maintain a healthy weight Body mass index (BMI) is used to identify weight problems. It estimates body fat based on height and weight. Your health care provider can help determine your BMI and help you achieve or maintain a healthy weight. Get regular exercise Get regular exercise. This is one of the most important things you can do for your health. Most adults should:  Exercise for at least 150 minutes each week. The exercise should increase your heart rate and make you sweat (moderate-intensity exercise).  Do strengthening exercises at least twice a week. This is in addition to the moderate-intensity exercise.  Spend less time sitting. Even light physical activity can be beneficial. Watch cholesterol and blood lipids Have your blood tested for lipids and cholesterol at 62 years of age, then have this test every 5 years. Have your cholesterol levels checked more often if:  Your lipid or cholesterol levels are high.  You are older than 62 years of age.  You are at high risk for heart disease. What should I know about cancer screening? Depending on your health history and family history, you may need to have cancer screening at various ages. This may include  screening for:  Breast cancer.  Cervical cancer.  Colorectal cancer.  Skin cancer.  Lung cancer. What should I know about heart disease, diabetes, and high blood pressure? Blood pressure and heart disease  High blood pressure causes heart disease and increases the risk of stroke. This is more likely to develop in people who have high blood pressure readings, are of African descent, or are overweight.  Have your blood pressure checked: ? Every 3-5 years if you are 43-57 years of age. ? Every year if you are 64 years old or older. Diabetes Have regular diabetes screenings. This checks your fasting blood sugar level. Have the screening done:  Once every three years after age 71 if you are at a normal weight and have a low risk for diabetes.  More often and at a younger age if you are overweight or have a high risk for diabetes. What should I know about preventing infection? Hepatitis B If you have a higher risk for hepatitis B, you should be screened for this virus. Talk with your health care provider to find out if you are at risk for hepatitis B infection. Hepatitis C Testing is recommended for:  Everyone born from [birth year] through 1967.  Anyone with known risk factors for hepatitis C. Sexually transmitted infections (STIs)  Get screened for STIs, including gonorrhea and chlamydia, if: ? You are sexually active and are younger than 62 years of age. ? You are older than 62 years of age and your health care provider tells you that you are at risk for this type of  infection. ? Your sexual activity has changed since you were last screened, and you are at increased risk for chlamydia or gonorrhea. Ask your health care provider if you are at risk.  Ask your health care provider about whether you are at high risk for HIV. Your health care provider may recommend a prescription medicine to help prevent HIV infection. If you choose to take medicine to prevent HIV, you should first get  tested for HIV. You should then be tested every 3 months for as long as you are taking the medicine. Pregnancy  If you are about to stop having your period (premenopausal) and you may become pregnant, seek counseling before you get pregnant.  Take 400 to 800 micrograms (mcg) of folic acid every day if you become pregnant.  Ask for birth control (contraception) if you want to prevent pregnancy. Osteoporosis and menopause Osteoporosis is a disease in which the bones lose minerals and strength with aging. This can result in bone fractures. If you are 42 years old or older, or if you are at risk for osteoporosis and fractures, ask your health care provider if you should:  Be screened for bone loss.  Take a calcium or vitamin D supplement to lower your risk of fractures.  Be given hormone replacement therapy (HRT) to treat symptoms of menopause. Follow these instructions at home: Lifestyle  Do not use any products that contain nicotine or tobacco, such as cigarettes, e-cigarettes, and chewing tobacco. If you need help quitting, ask your health care provider.  Do not use street drugs.  Do not share needles.  Ask your health care provider for help if you need support or information about quitting drugs. Alcohol use  Do not drink alcohol if: ? Your health care provider tells you not to drink. ? You are pregnant, may be pregnant, or are planning to become pregnant.  If you drink alcohol: ? Limit how much you use to 0-1 drink a day. ? Limit intake if you are breastfeeding.  Be aware of how much alcohol is in your drink. In the [place] one drink equals one 12 oz bottle of beer (355 mL), one 5 oz glass of wine (148 mL), or one 1 oz glass of hard liquor (44 mL). General instructions  Schedule regular health, dental, and eye exams.  Stay current with your vaccines.  Tell your health care provider if: ? You often feel depressed. ? You have ever been abused or do not feel safe at  home. Summary  Adopting a healthy lifestyle and getting preventive care are important in promoting health and wellness.  Follow your health care provider's instructions about healthy diet, exercising, and getting tested or screened for diseases.  Follow your health care provider's instructions on monitoring your cholesterol and blood pressure. This information is not intended to replace advice given to you by your health care provider. Make sure you discuss any questions you have with your health care provider. Document Revised: 08/23/2018 Document Reviewed: 08/23/2018 Elsevier Patient Education  2021 [employer] Preventive Care 52-44 Years Old, Female Preventive care refers to lifestyle choices and visits with your health care provider that can promote health and wellness. This includes:  A yearly physical exam. This is also called an annual wellness visit.  Regular dental and eye exams.  Immunizations.  Screening for certain conditions.  Healthy lifestyle choices, such as: ? Eating a healthy diet. ? Getting regular exercise. ? Not using drugs or products that contain nicotine and tobacco. ? Limiting  alcohol use. What can I expect for my preventive care visit? Physical exam Your health care provider will check your:  Height and weight. These may be used to calculate your BMI (body mass index). BMI is a measurement that tells if you are at a healthy weight.  Heart rate and blood pressure.  Body temperature.  Skin for abnormal spots. Counseling Your health care provider may ask you questions about your:  Past medical problems.  Family's medical history.  Alcohol, tobacco, and drug use.  Emotional well-being.  Home life and relationship well-being.  Sexual activity.  Diet, exercise, and sleep habits.  Work and work Statistician.  Access to firearms.  Method of birth control.  Menstrual cycle.  Pregnancy history. What immunizations do I need? Vaccines  are usually given at various ages, according to a schedule. Your health care provider will recommend vaccines for you based on your age, medical history, and lifestyle or other factors, such as travel or where you work.   What tests do I need? Blood tests  Lipid and cholesterol levels. These may be checked every 5 years, or more often if you are over 85 years old.  Hepatitis C test.  Hepatitis B test. Screening  Lung cancer screening. You may have this screening every year starting at age 81 if you have a 30-pack-year history of smoking and currently smoke or have quit within the past 15 years.  Colorectal cancer screening. ? All adults should have this screening starting at age 75 and continuing until age 60. ? Your health care provider may recommend screening at age 18 if you are at increased risk. ? You will have tests every 1-10 years, depending on your results and the type of screening test.  Diabetes screening. ? This is done by checking your blood sugar (glucose) after you have not eaten for a while (fasting). ? You may have this done every 1-3 years.  Mammogram. ? This may be done every 1-2 years. ? Talk with your health care provider about when you should start having regular mammograms. This may depend on whether you have a family history of breast cancer.  BRCA-related cancer screening. This may be done if you have a family history of breast, ovarian, tubal, or peritoneal cancers.  Pelvic exam and Pap test. ? This may be done every 3 years starting at age 50. ? Starting at age 76, this may be done every 5 years if you have a Pap test in combination with an HPV test. Other tests  STD (sexually transmitted disease) testing, if you are at risk.  Bone density scan. This is done to screen for osteoporosis. You may have this scan if you are at high risk for osteoporosis. Talk with your health care provider about your test results, treatment options, and if necessary, the need  for more tests. Follow these instructions at home: Eating and drinking  Eat a diet that includes fresh fruits and vegetables, whole grains, lean protein, and low-fat dairy products.  Take vitamin and mineral supplements as recommended by your health care provider.  Do not drink alcohol if: ? Your health care provider tells you not to drink. ? You are pregnant, may be pregnant, or are planning to become pregnant.  If you drink alcohol: ? Limit how much you have to 0-1 drink a day. ? Be aware of how much alcohol is in your drink. In the [place] one drink equals one 12 oz bottle of beer (355 mL), one 5 oz  glass of wine (148 mL), or one 1 oz glass of hard liquor (44 mL).   Lifestyle  Take daily care of your teeth and gums. Brush your teeth every morning and night with fluoride toothpaste. Floss one time each day.  Stay active. Exercise for at least 30 minutes 5 or more days each week.  Do not use any products that contain nicotine or tobacco, such as cigarettes, e-cigarettes, and chewing tobacco. If you need help quitting, ask your health care provider.  Do not use drugs.  If you are sexually active, practice safe sex. Use a condom or other form of protection to prevent STIs (sexually transmitted infections).  If you do not wish to become pregnant, use a form of birth control. If you plan to become pregnant, see your health care provider for a prepregnancy visit.  If told by your health care provider, take low-dose aspirin daily starting at age 34.  Find healthy ways to cope with stress, such as: ? Meditation, yoga, or listening to music. ? Journaling. ? Talking to a trusted person. ? Spending time with friends and family. Safety  Always wear your seat belt while driving or riding in a vehicle.  Do not drive: ? If you have been drinking alcohol. Do not ride with someone who has been drinking. ? When you are tired or distracted. ? While texting.  Wear a helmet and other  protective equipment during sports activities.  If you have firearms in your house, make sure you follow all gun safety procedures. What's next?  Visit your health care provider once a year for an annual wellness visit.  Ask your health care provider how often you should have your eyes and teeth checked.  Stay up to date on all vaccines. This information is not intended to replace advice given to you by your health care provider. Make sure you discuss any questions you have with your health care provider. Document Revised: 06/03/2020 Document Reviewed: 05/11/2018 Elsevier Patient Education  2021 [employer].

## 2021-01-12 NOTE — Progress Notes (Signed)
Subjective:  Patient ID: Nicole Marquez, female    DOB: 1961/08/19  Age: 60 y.o. MRN: [id number]  Chief Complaint  Patient presents with  . Annual Exam    HPI Encounter for general adult medical examination without abnormal findings  Physical ("At Risk" items are starred): Patient's last physical exam was 1 year ago . Pt recently moved to [place], [place] from [place]  approximately 84-months ago. She has not had preventative screenings such as mammogram, colonoscopy, or pap smear since she relocated. She states she does see an eye doctor regularly due to macular degeneration. She was recently treated for URI. States symptoms have subsided.  SDOH Screenings   Alcohol Screen: Not on file  Depression (PHQ2-9): Low Risk   . PHQ-2 Score: 0  Financial Resource Strain: Not on file  Food Insecurity: Not on file  Housing: Not on file  Physical Activity: Not on file  Social Connections: Not on file  Stress: Not on file  Tobacco Use: Medium Risk  . Smoking Tobacco Use: Former Smoker  . Smokeless Tobacco Use: Never Used  Transportation Needs: Not on file    Fall Risk  01/12/2021 11/25/2020  Falls in the past year? 0 0  Number falls in past yr: 0 0  Injury with Fall? 0 0  Follow up Falls evaluation completed -    Depression screen [facility] 2/9 11/25/2020 12/30/2019  Decreased Interest 0 0  Down, Depressed, Hopeless 0 0  PHQ - 2 Score 0 0    Functional Status Survey:     Safety: reviewed ;  Patient wears a seat belt. Patient's home has smoke detectors and carbon monoxide detectors. Patient practices appropriate gun safety Patient wears sunscreen with extended sun exposure. Dental Care: biannual cleanings, brushes and flosses daily. Ophthalmology/Optometry:  Hearing loss: none Vision impairments: Macular degeneration Patient is not afflicted from Stress Incontinence and Urge Incontinence   Current Outpatient Medications on File Prior to Visit  Medication Sig Dispense Refill   . albuterol (VENTOLIN HFA) 108 (90 Base) MCG/ACT inhaler INHALE 1 TO 2 PUFFS EVERY 4 HOURS AS NEEDED FOR WHEEZING 8.5 g 2  . Continuous Blood Gluc Sensor (DEXCOM G6 SENSOR) MISC Check sugars qac and qhs. 9 each 3  . Continuous Blood Gluc Transmit (DEXCOM G6 TRANSMITTER) MISC Change transmitter every 3 months DX CODE:  E10.649 1 each 4  . HYDROcodone bit-homatropine (HYDROMET) 5-1.5 MG/5ML syrup Take 5 mLs by mouth every 6 (six) hours as needed for cough. 120 mL 0  . HYDROcodone-homatropine (HYCODAN) 5-1.5 MG/5ML syrup Take 5 mLs by mouth every 6 (six) hours as needed for cough. 120 mL 0  . insulin aspart (NOVOLOG FLEXPEN) 100 UNIT/ML FlexPen Inject into the skin 3 (three) times daily with meals.    . Insulin Pen Needle 32G X 4 MM MISC 1 each by Does not apply route 4 (four) times daily. 200 each 1  . levothyroxine (SYNTHROID) 88 MCG tablet Take 1 tablet (88 mcg total) by mouth daily. 90 tablet 3  . LORazepam (ATIVAN) 0.5 MG tablet TAKE 1 TABLET(0.5 MG) BY MOUTH DAILY AS NEEDED FOR ANXIETY 30 tablet 0  . montelukast (SINGULAIR) 10 MG tablet Take 1 tablet (10 mg total) by mouth at bedtime. 90 tablet 0  . ramipril (ALTACE) 10 MG capsule TAKE 1 CAPSULE(10 MG) BY MOUTH DAILY 90 capsule 0  . rosuvastatin (CRESTOR) 40 MG tablet Take 40 mg by mouth daily.    . TRESIBA FLEXTOUCH 200 UNIT/ML FlexTouch Pen ADMINISTER 28 UNITS  UNDER THE SKIN DAILY 6 mL 0   No current facility-administered medications on file prior to visit.    Social Hx   Social History   Socioeconomic History  . Marital status: Married    Spouse name: Not on file  . Number of children: 4  . Years of education: Not on file  . Highest education level: Not on file  Occupational History  . Not on file  Tobacco Use  . Smoking status: Former Smoker    Types: Cigarettes    Quit date: 2008    Years since quitting: 14.3  . Smokeless tobacco: Never Used  Vaping Use  . Vaping Use: Never used  Substance and Sexual Activity  . Alcohol  use: Never  . Drug use: Never  . Sexual activity: Not on file  Other Topics Concern  . Not on file  Social History Narrative   Patient has 1 set of twins and two individual children.   Social Determinants of Health   Financial Resource Strain: Not on file  Food Insecurity: Not on file  Transportation Needs: Not on file  Physical Activity: Not on file  Stress: Not on file  Social Connections: Not on file   Past Medical History:  Diagnosis Date  . Exudative age-related macular degeneration, bilateral, stage unspecified ([place])   . Family history of malignant neoplasm of digestive organs   . Mixed hyperlipidemia   . Other specified hypothyroidism   . Type 1 diabetes mellitus with hypoglycemia without coma ([facility])    Family History  Problem Relation Age of Onset  . Colon cancer Brother 49  . Diabetes Father   . Kidney failure Sister     Review of Systems  Constitutional: Negative for appetite change, fatigue and unexpected weight change.  HENT: Negative for congestion, ear pain, rhinorrhea, sinus pressure, sinus pain and tinnitus.   Eyes: Negative for pain.  Respiratory: Negative for cough and shortness of breath.   Cardiovascular: Negative for chest pain, palpitations and leg swelling.  Gastrointestinal: Negative for abdominal pain, constipation, diarrhea, nausea and vomiting.  Endocrine: Negative for cold intolerance, heat intolerance, polydipsia, polyphagia and polyuria.  Genitourinary: Negative for dysuria, frequency and hematuria.  Musculoskeletal: Negative for arthralgias, back pain, joint swelling and myalgias.  Skin: Negative for rash.  Allergic/Immunologic: Negative for environmental allergies.  Neurological: Negative for dizziness and headaches.  Hematological: Negative for adenopathy.  Psychiatric/Behavioral: Negative for decreased concentration and sleep disturbance. The patient is not nervous/anxious.      Objective:  BP 108/62 (BP Location: Left Arm, Patient  Position: Sitting)   Pulse 72   Temp 97.9 F (36.6 C) (Temporal)   Ht 5' (1.524 m)   Wt 124 lb (56.2 kg)   SpO2 98%   BMI 24.22 kg/m   BP/Weight 01/12/2021 12/29/2020 02/08/4131  Systolic BP 440 102 725  Diastolic BP 62 70 60  Wt. (Lbs) 124 120 122  BMI 24.22 23.44 23.83    Physical Exam Vitals reviewed.  Constitutional:      Appearance: Normal appearance.  HENT:     Head: Normocephalic.     Right Ear: Tympanic membrane normal.     Left Ear: Tympanic membrane normal.     Nose: Nose normal.     Mouth/Throat:     Mouth: Mucous membranes are moist.  Eyes:     Pupils: Pupils are equal, round, and reactive to light.  Cardiovascular:     Rate and Rhythm: Normal rate and regular rhythm.  Pulses: Normal pulses.     Heart sounds: Normal heart sounds.  Pulmonary:     Effort: Pulmonary effort is normal.     Breath sounds: Normal breath sounds.  Abdominal:     General: Bowel sounds are normal.     Palpations: Abdomen is soft.  Musculoskeletal:        General: Normal range of motion.     Cervical back: Neck supple.  Skin:    General: Skin is warm and dry.     Capillary Refill: Capillary refill takes less than 2 seconds.  Neurological:     General: No focal deficit present.     Mental Status: She is alert and oriented to person, place, and time.  Psychiatric:        Mood and Affect: Mood normal.        Behavior: Behavior normal.    BP 108/62 (BP Location: Left Arm, Patient Position: Sitting)   Pulse 72   Temp 97.9 F (36.6 C) (Temporal)   Ht 5' (1.524 m)   Wt 124 lb (56.2 kg)   SpO2 98%   BMI 24.22 kg/m  Lab Results  Component Value Date   WBC 7.8 11/24/2020   HGB 14.3 11/24/2020   HCT 43.6 11/24/2020   PLT 228 11/24/2020   GLUCOSE 86 11/24/2020   CHOL 326 (H) 11/24/2020   TRIG 103 11/24/2020   HDL 63 11/24/2020   LDLCALC 246 (H) 11/24/2020   ALT 14 11/24/2020   AST 18 11/24/2020   NA 142 11/24/2020   K 4.5 11/24/2020   CL 103 11/24/2020   CREATININE  0.85 11/24/2020   BUN 15 11/24/2020   CO2 24 11/24/2020   TSH 0.465 11/24/2020   HGBA1C 6.8 (H) 11/24/2020   MICROALBUR 30 11/25/2020      Assessment & Plan:  1. Annual physical exam  2. Encounter for screening for malignant neoplasm of colon - Ambulatory referral to Gastroenterology  3. Encounter for screening mammogram for malignant neoplasm of breast - MM DIGITAL SCREENING BILATERAL  4. Encounter for screening for malignant neoplasm of cervix - Ambulatory referral to Obstetrics / Gynecology     We will call you with appointments for colonoscopy, mammogram, and pap smear Continue medications as prescribed Follow-up on 02/16/21 at 0800, fasting, or sooner if neede  This is a list of the screening recommended for you and due dates:  Health Maintenance  Topic Date Due  .  Hepatitis C: One time screening is recommended by Center for Disease Control  (CDC) for  adults born from [birth year] through 1967.   Never done  . Pneumococcal vaccine  Never done  . COVID-19 Vaccine (1) Never done  . Eye exam for diabetics  Never done  . HIV Screening  Never done  . Tetanus Vaccine  Never done  . Pap Smear  Never done  . Colon Cancer Screening  Never done  . Mammogram  12/25/2009  . Flu Shot  04/13/2021  . Hemoglobin A1C  05/27/2021  . Complete foot exam   11/25/2021  . HPV Vaccine  Aged Out      AN INDIVIDUALIZED CARE PLAN: was established or reinforced today.   SELF MANAGEMENT: The patient and I together assessed ways to personally work towards obtaining the recommended goals  Support needs The patient and/or family needs were assessed and services were offered and not necessary at this time.    Follow-up: 02/16/21   [place] [phone]

## 2021-01-19 ENCOUNTER — Telehealth: Payer: Self-pay

## 2021-01-19 NOTE — Telephone Encounter (Signed)
LM for return call to schedule appointment for Mobile Mammogram.

## 2021-01-26 ENCOUNTER — Other Ambulatory Visit: Payer: Self-pay | Admitting: Physician Assistant

## 2021-01-26 DIAGNOSIS — R059 Cough, unspecified: Secondary | ICD-10-CM

## 2021-01-26 MED ORDER — ALBUTEROL SULFATE HFA 108 (90 BASE) MCG/ACT IN AERS: INHALATION_SPRAY | RESPIRATORY_TRACT | 2 refills | Status: DC

## 2021-01-26 MED ORDER — HYDROCODONE BIT-HOMATROP MBR 5-1.5 MG/5ML PO SOLN: 5.0000 mL | Freq: Four times a day (QID) | ORAL | 0 refills | Status: DC | PRN

## 2021-01-26 NOTE — Telephone Encounter (Signed)
Pt husband is requesting 15 day supply with refills on albuterol. States the mold has been worse therefore pt has been having problems and has needed inhaler more.   Royce Macadamia, [place] 01/26/21 11:39 AM

## 2021-02-02 ENCOUNTER — Ambulatory Visit
Admission: RE | Admit: 2021-02-02 | Discharge: 2021-02-02 | Disposition: A | Discharge status: Home / Self Care | Payer: BC Managed Care – PPO | Source: Ambulatory Visit | Attending: Family Medicine | Admitting: Family Medicine

## 2021-02-02 ENCOUNTER — Other Ambulatory Visit: Payer: Self-pay

## 2021-02-06 ENCOUNTER — Other Ambulatory Visit: Payer: Self-pay | Admitting: Nurse Practitioner

## 2021-02-06 DIAGNOSIS — R921 Mammographic calcification found on diagnostic imaging of breast: Secondary | ICD-10-CM

## 2021-02-06 DIAGNOSIS — R928 Other abnormal and inconclusive findings on diagnostic imaging of breast: Secondary | ICD-10-CM

## 2021-02-13 ENCOUNTER — Other Ambulatory Visit: Payer: Self-pay

## 2021-02-13 ENCOUNTER — Ambulatory Visit
Admission: RE | Admit: 2021-02-13 | Discharge: 2021-02-13 | Disposition: A | Discharge status: Home / Self Care | Payer: BC Managed Care – PPO | Source: Ambulatory Visit | Attending: Nurse Practitioner | Admitting: Nurse Practitioner

## 2021-02-13 ENCOUNTER — Other Ambulatory Visit: Payer: Self-pay | Admitting: Nurse Practitioner

## 2021-02-13 DIAGNOSIS — R921 Mammographic calcification found on diagnostic imaging of breast: Secondary | ICD-10-CM

## 2021-02-16 ENCOUNTER — Other Ambulatory Visit: Payer: Self-pay

## 2021-02-16 ENCOUNTER — Ambulatory Visit
Admission: RE | Admit: 2021-02-16 | Discharge: 2021-02-16 | Disposition: A | Discharge status: Home / Self Care | Payer: BC Managed Care – PPO | Source: Ambulatory Visit | Attending: Nurse Practitioner | Admitting: Nurse Practitioner

## 2021-02-16 DIAGNOSIS — R921 Mammographic calcification found on diagnostic imaging of breast: Secondary | ICD-10-CM

## 2021-02-18 ENCOUNTER — Telehealth: Payer: Self-pay | Admitting: Hematology and Oncology

## 2021-02-18 NOTE — Telephone Encounter (Signed)
LVM for patient to return call in reference to the Breast Clinic appointment on 6/15 @ 1215, will send paperwork via email

## 2021-02-18 NOTE — Telephone Encounter (Signed)
Spoke with patients husband to confirm appointment on 6/15 at 1215

## 2021-02-19 ENCOUNTER — Encounter: Payer: Self-pay | Admitting: *Deleted

## 2021-02-19 ENCOUNTER — Other Ambulatory Visit: Payer: Self-pay | Admitting: Family Medicine

## 2021-02-19 DIAGNOSIS — D0511 Intraductal carcinoma in situ of right breast: Secondary | ICD-10-CM | POA: Insufficient documentation

## 2021-02-19 DIAGNOSIS — Z86000 Personal history of in-situ neoplasm of breast: Secondary | ICD-10-CM | POA: Insufficient documentation

## 2021-02-23 ENCOUNTER — Telehealth: Payer: Self-pay

## 2021-02-23 ENCOUNTER — Other Ambulatory Visit: Payer: Self-pay | Admitting: Nurse Practitioner

## 2021-02-23 ENCOUNTER — Encounter: Payer: Self-pay | Admitting: Nurse Practitioner

## 2021-02-23 DIAGNOSIS — R059 Cough, unspecified: Secondary | ICD-10-CM

## 2021-02-23 MED ORDER — BENZONATATE 100 MG PO CAPS: 100.0000 mg | ORAL_CAPSULE | Freq: Two times a day (BID) | ORAL | 0 refills | Status: DC | PRN

## 2021-02-23 NOTE — Telephone Encounter (Signed)
Mr Lynann Beaver called requesting a refill on the Hydromet for Mrs. Mostellar's cough.  He said that she has issues with mold and has problems with an ongoing cough.  Symptoms discussed with Jerrell Belfast, NP.  RX for Tessalon Perls to be sent to [facility] in [place].

## 2021-02-24 NOTE — Progress Notes (Signed)
[facility] CONSULT NOTE  Patient Care Team: Rochel Brome, MD as PCP - General (Family Medicine) Mauro Kaufmann, RN as Oncology Nurse Navigator Rockwell Germany, RN as Oncology Nurse Navigator [name], MD as Consulting Physician (General Surgery) Nicholas Lose, MD as Consulting Physician (Hematology and Oncology) Kyung Rudd, MD as Consulting Physician (Radiation Oncology)  CHIEF COMPLAINTS/PURPOSE OF CONSULTATION:  Newly diagnosed high grade DCIS of right breast  HISTORY OF PRESENTING ILLNESS:  Nicole Marquez 60 y.o. 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 Screening mammogram on 02/02/2021 showed calcification in the right breast warranting further evaluation and no suspicious findings in the left breast. Diagnostic mammogram and [place] on 02/13/2021 showed 3 cm group of suspicious inner lower right breast calcifications. 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 She presents to the clinic today for initial evaluation and discussion of treatment options.   I reviewed her records extensively and collaborated the history with the patient.  SUMMARY OF ONCOLOGIC HISTORY: Oncology History  Ductal carcinoma in situ (DCIS) of right breast  02/19/2021 Initial Diagnosis   Screening mammogram on 02/02/2021 showed calcification in the right breast. Diagnostic mammogram and [place] on 02/13/2021 showed 3 cm group of suspicious inner lower right breast calcifications. 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      MEDICAL HISTORY:  Past Medical History:  Diagnosis Date   Breast cancer ([place])    Exudative age-related macular degeneration, bilateral, stage unspecified ([place])     Family history of malignant neoplasm of digestive organs    Mixed hyperlipidemia    Other specified hypothyroidism    Type 1 diabetes mellitus with hypoglycemia without coma ([place])     SURGICAL HISTORY: Past Surgical History:  Procedure Laterality Date   CESAREAN SECTION     x3   ENDOMETRIAL ABLATION     Right rotator cuff repair      SOCIAL HISTORY: Social History   Socioeconomic History   Marital status: Married    Spouse name: Not on file   Number of children: 4   Years of education: Not on file   Highest education level: Not on file  Occupational History   Not on file  Tobacco Use   Smoking status: Former    Pack years: 0.00    Types: Cigarettes    Quit date: 2008    Years since quitting: 14.4   Smokeless tobacco: Never  Vaping Use   Vaping Use: Never used  Substance and Sexual Activity   Alcohol use: Never   Drug use: Never   Sexual activity: Not on file  Other Topics Concern   Not on file  Social History Narrative   Patient has 1 set of twins and two individual children.   Social Determinants of Health   Financial Resource Strain: Not on file  Food Insecurity: Not on file  Transportation Needs: Not on file  Physical Activity: Not on file  Stress: Not on file  Social Connections: Not on file  Intimate Partner Violence: Not on file    FAMILY HISTORY: Family History  Problem Relation Age of Onset   Colon cancer Mother    Diabetes Father    Kidney failure Sister    Colon  cancer Brother 35   Lung cancer Paternal Aunt    Colon cancer Maternal Grandfather     ALLERGIES:  is allergic to ciprofloxacin and lipitor [atorvastatin].  MEDICATIONS:  Current Outpatient Medications  Medication Sig Dispense Refill   Aflibercept (EYLEA IO) Inject into the eye. Q 8 weeks     albuterol (VENTOLIN HFA) 108 (90 Base) MCG/ACT inhaler INHALE 1 TO 2 PUFFS EVERY 4 HOURS AS NEEDED FOR WHEEZING 8.5 g 2   Continuous Blood Gluc Sensor (DEXCOM G6 SENSOR) MISC Check sugars  qac and qhs. 9 each 3   Continuous Blood Gluc Transmit (DEXCOM G6 TRANSMITTER) MISC Change transmitter every 3 months DX CODE:  E10.649 1 each 4   insulin aspart (NOVOLOG FLEXPEN) 100 UNIT/ML FlexPen Inject into the skin 3 (three) times daily with meals.     insulin degludec (TRESIBA FLEXTOUCH) 200 UNIT/ML FlexTouch Pen ADMINISTER 28 UNITS UNDER THE SKIN DAILY 6 mL 0   Insulin Pen Needle 32G X 4 MM MISC 1 each by Does not apply route 4 (four) times daily. 200 each 1   levothyroxine (SYNTHROID) 88 MCG tablet Take 1 tablet (88 mcg total) by mouth daily. 90 tablet 3   montelukast (SINGULAIR) 10 MG tablet Take 1 tablet (10 mg total) by mouth at bedtime. 90 tablet 0   ramipril (ALTACE) 10 MG capsule TAKE 1 CAPSULE(10 MG) BY MOUTH DAILY 90 capsule 0   rosuvastatin (CRESTOR) 40 MG tablet Take 1 tablet (40 mg total) by mouth daily. 90 tablet 0   benzonatate (TESSALON) 100 MG capsule Take 1 capsule (100 mg total) by mouth 2 (two) times daily as needed for cough. 21 capsule 0   HYDROcodone bit-homatropine (HYDROMET) 5-1.5 MG/5ML syrup Take 5 mLs by mouth every 6 (six) hours as needed for cough. 120 mL 0   HYDROcodone-homatropine (HYCODAN) 5-1.5 MG/5ML syrup Take 5 mLs by mouth every 6 (six) hours as needed for cough. 120 mL 0   LORazepam (ATIVAN) 0.5 MG tablet TAKE 1 TABLET(0.5 MG) BY MOUTH DAILY AS NEEDED FOR ANXIETY 30 tablet 0   No current facility-administered medications for this visit.    REVIEW OF SYSTEMS:   Constitutional: Denies fevers, chills or abnormal night sweats  All other systems were reviewed with the patient and are negative.  PHYSICAL EXAMINATION: ECOG PERFORMANCE STATUS: 1 - Symptomatic but completely ambulatory  Vitals:   02/25/21 1303  BP: (!) 145/64  Pulse: 96  Resp: 18  Temp: 98.1 F (36.7 C)  SpO2: 98%   Filed Weights   02/25/21 1303  Weight: 124 lb 9.6 oz (56.5 kg)     LABORATORY DATA:  I have reviewed the data as listed Lab Results  Component Value Date    WBC 7.5 02/25/2021   HGB 13.1 02/25/2021   HCT 38.9 02/25/2021   MCV 87.0 02/25/2021   PLT 235 02/25/2021   Lab Results  Component Value Date   NA 140 02/25/2021   K 4.5 02/25/2021   CL 105 02/25/2021   CO2 28 02/25/2021    RADIOGRAPHIC STUDIES: I have personally reviewed the radiological reports and agreed with the findings in the report.  ASSESSMENT AND PLAN:  Ductal carcinoma in situ (DCIS) of right breast Screening mammogram on 02/02/2021 showed calcification in the right breast. Diagnostic mammogram and [place] on 02/13/2021 showed 3 cm group of suspicious inner lower right breast calcifications. 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  Pathology review: I discussed with the patient the difference between DCIS and invasive breast cancer. It is considered a precancerous lesion. DCIS is classified as a 0. It is generally detected through mammograms as calcifications. We discussed the significance of grades and its impact on prognosis. We also discussed the importance of ER and PR receptors and their implications to adjuvant treatment options. Prognosis of DCIS dependence on grade, comedo necrosis. It is anticipated that if not treated, 20-30% of DCIS can develop into invasive breast cancer.  Recommendation: 1. Breast conserving surgery 2. Followed by adjuvant radiation therapy 3. Followed by antiestrogen therapy with tamoxifen 5 years  Tamoxifen counseling: We discussed the risks and benefits of tamoxifen. These include but not limited to insomnia, hot flashes, mood changes, vaginal dryness, and weight gain. Although rare, serious side effects including endometrial cancer, risk of blood clots were also discussed. We strongly believe that the benefits far outweigh the risks. Patient understands these risks and consented to starting treatment. Planned treatment duration is 5 years.  Return to clinic  after surgery to discuss the final pathology report and come up with an adjuvant treatment plan.    All questions were answered. The patient knows to call the clinic with any problems, questions or concerns.   Rulon Eisenmenger, MD, MPH 02/25/2021    I, Thana Ates, am acting as scribe for Nicholas Lose, MD.  I have reviewed the above documentation for accuracy and completeness, and I agree with the above.

## 2021-02-25 ENCOUNTER — Ambulatory Visit: Payer: BC Managed Care – PPO | Admitting: Physical Therapy

## 2021-02-25 ENCOUNTER — Other Ambulatory Visit: Payer: Self-pay | Admitting: *Deleted

## 2021-02-25 ENCOUNTER — Encounter: Payer: Self-pay | Admitting: General Practice

## 2021-02-25 ENCOUNTER — Ambulatory Visit
Admission: RE | Admit: 2021-02-25 | Discharge: 2021-02-25 | Disposition: A | Discharge status: Home / Self Care | Payer: BC Managed Care – PPO | Source: Ambulatory Visit | Attending: Radiation Oncology | Admitting: Radiation Oncology

## 2021-02-25 ENCOUNTER — Encounter: Payer: Self-pay | Admitting: *Deleted

## 2021-02-25 ENCOUNTER — Inpatient Hospital Stay: Payer: BC Managed Care – PPO | Attending: Hematology and Oncology

## 2021-02-25 ENCOUNTER — Ambulatory Visit (HOSPITAL_BASED_OUTPATIENT_CLINIC_OR_DEPARTMENT_OTHER): Payer: BC Managed Care – PPO | Admitting: Genetic Counselor

## 2021-02-25 ENCOUNTER — Encounter: Payer: Self-pay | Admitting: Hematology and Oncology

## 2021-02-25 ENCOUNTER — Inpatient Hospital Stay (HOSPITAL_BASED_OUTPATIENT_CLINIC_OR_DEPARTMENT_OTHER): Payer: BC Managed Care – PPO | Admitting: Hematology and Oncology

## 2021-02-25 ENCOUNTER — Other Ambulatory Visit: Payer: Self-pay

## 2021-02-25 DIAGNOSIS — D0511 Intraductal carcinoma in situ of right breast: Secondary | ICD-10-CM | POA: Diagnosis present

## 2021-02-25 DIAGNOSIS — Z87891 Personal history of nicotine dependence: Secondary | ICD-10-CM | POA: Diagnosis not present

## 2021-02-25 DIAGNOSIS — Z794 Long term (current) use of insulin: Secondary | ICD-10-CM | POA: Diagnosis not present

## 2021-02-25 DIAGNOSIS — E109 Type 1 diabetes mellitus without complications: Secondary | ICD-10-CM | POA: Diagnosis not present

## 2021-02-25 DIAGNOSIS — Z79899 Other long term (current) drug therapy: Secondary | ICD-10-CM | POA: Insufficient documentation

## 2021-02-25 DIAGNOSIS — Z800 Family history of malignant neoplasm of digestive organs: Secondary | ICD-10-CM

## 2021-02-25 LAB — CMP (CANCER CENTER ONLY)
ALT: 15 U/L (ref 0–44)
AST: 18 U/L (ref 15–41)
Albumin: 4 g/dL (ref 3.5–5.0)
Alkaline Phosphatase: 65 U/L (ref 38–126)
Anion gap: 7 (ref 5–15)
BUN: 12 mg/dL (ref 8–23)
CO2: 28 mmol/L (ref 22–32)
Calcium: 9.8 mg/dL (ref 8.9–10.3)
Chloride: 105 mmol/L (ref 98–111)
Creatinine: 0.78 mg/dL (ref 0.44–1.00)
GFR, Estimated: >60 mL/min (ref >60)
Glucose, Bld: 131 mg/dL — ABNORMAL HIGH (ref 70–99)
Potassium: 4.5 mmol/L (ref 3.5–5.1)
Sodium: 140 mmol/L (ref 135–145)
Total Bilirubin: 0.4 mg/dL (ref 0.3–1.2)
Total Protein: 6.4 g/dL — ABNORMAL LOW (ref 6.5–8.1)

## 2021-02-25 LAB — CBC WITH DIFFERENTIAL (CANCER CENTER ONLY)
Abs Immature Granulocytes: 0.02 10*3/uL (ref 0.00–0.07)
Basophils Absolute: 0.1 10*3/uL (ref 0.0–0.1)
Basophils Relative: 1 %
Eosinophils Absolute: 0.4 10*3/uL (ref 0.0–0.5)
Eosinophils Relative: 5 %
HCT: 38.9 % (ref 36.0–46.0)
Hemoglobin: 13.1 g/dL (ref 12.0–15.0)
Immature Granulocytes: 0 %
Lymphocytes Relative: 42 %
Lymphs Abs: 3.2 10*3/uL (ref 0.7–4.0)
MCH: 29.3 pg (ref 26.0–34.0)
MCHC: 33.7 g/dL (ref 30.0–36.0)
MCV: 87 fL (ref 80.0–100.0)
Monocytes Absolute: 0.5 10*3/uL (ref 0.1–1.0)
Monocytes Relative: 6 %
Neutro Abs: 3.4 10*3/uL (ref 1.7–7.7)
Neutrophils Relative %: 46 %
Platelet Count: 235 10*3/uL (ref 150–400)
RBC: 4.47 MIL/uL (ref 3.87–5.11)
RDW: 11.4 % — ABNORMAL LOW (ref 11.5–15.5)
WBC Count: 7.5 10*3/uL (ref 4.0–10.5)
nRBC: 0 % (ref 0.0–0.2)

## 2021-02-25 LAB — GENETIC SCREENING ORDER

## 2021-02-25 NOTE — Progress Notes (Signed)
[facility] Psychosocial Distress Screening Spiritual Care  Met with Nicole Marquez and her husband of 16 years in [facility] to introduce [facility] team/resources, reviewing distress screen per protocol.  The patient scored a 2 on the Psychosocial Distress Thermometer which indicates mild distress. Also assessed for distress and other psychosocial needs.   ON[facility] DISTRESS SCREENING 02/25/2021  Screening Type Initial Screening  Distress experienced in past week (1-10) 2  Emotional problem type Nervousness/Anxiety;Adjusting to illness  Referral to support programs Yes   Ms Rising cites faith, perspective, and information from [facility] as key coping tools and notes that clinic participation brought her distress down to a 1.  Follow up needed: No. She reports no other needs at this time, but is aware of ongoing [facility] team/programming availability, should needs arise or circumstances change.   [place], [place], [facility] Pager [phone] Voicemail [phone]

## 2021-02-25 NOTE — Assessment & Plan Note (Signed)
Screening mammogram on 02/02/2021 showed calcification in the right breast. Diagnostic mammogram and [place] on 02/13/2021 showed 3 cm group of suspicious inner lower right breast calcifications. 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   Pathology review: I discussed with the patient the difference between DCIS and invasive breast cancer. It is considered a precancerous lesion. DCIS is classified as a 0. It is generally detected through mammograms as calcifications. We discussed the significance of grades and its impact on prognosis. We also discussed the importance of ER and PR receptors and their implications to adjuvant treatment options. Prognosis of DCIS dependence on grade, comedo necrosis. It is anticipated that if not treated, 20-30% of DCIS can develop into invasive breast cancer.  Recommendation: 1. Breast conserving surgery 2. Followed by adjuvant radiation therapy 3. Followed by antiestrogen therapy with tamoxifen 5 years  Tamoxifen counseling: We discussed the risks and benefits of tamoxifen. These include but not limited to insomnia, hot flashes, mood changes, vaginal dryness, and weight gain. Although rare, serious side effects including endometrial cancer, risk of blood clots were also discussed. We strongly believe that the benefits far outweigh the risks. Patient understands these risks and consented to starting treatment. Planned treatment duration is 5 years.  Return to clinic after surgery to discuss the final pathology report and come up with an adjuvant treatment plan.

## 2021-02-25 NOTE — Progress Notes (Signed)
Radiation Oncology         [phone] ________________________________  Name: Nicole Marquez        MRN: [id number]  Date of Service: 02/25/2021 DOB: 07-31-1961  CC:Cox, Elnita Maxwell, MD  [name], MD     REFERRING PHYSICIAN: [name], MD   DIAGNOSIS: The encounter diagnosis was Ductal carcinoma in situ (DCIS) of right breast.   HISTORY OF PRESENT ILLNESS: Nicole Marquez is a 60 y.o. female seen in the multidisciplinary breast clinic for a new diagnosis of right breast cancer. The patient was noted to have screening detected calcifications in the right breast, these were seen in the lower inner quadrant and measured a total of 3 cm by diagnostic mammography.   She underwent a stereotactic biopsy on 02/16/2021 which revealed high-grade DCIS with calcifications and necrosis, her cancer was ER positive, PR negative.  She is seen today to discuss treatment recommendations for her disease.    PREVIOUS RADIATION THERAPY: No   PAST MEDICAL HISTORY:  Past Medical History:  Diagnosis Date   Exudative age-related macular degeneration, bilateral, stage unspecified ([place])    Family history of malignant neoplasm of digestive organs    Mixed hyperlipidemia    Other specified hypothyroidism    Type 1 diabetes mellitus with hypoglycemia without coma ([place])        PAST SURGICAL HISTORY: Past Surgical History:  Procedure Laterality Date   CESAREAN SECTION     x3   ENDOMETRIAL ABLATION     Right rotator cuff repair       FAMILY HISTORY:  Family History  Problem Relation Age of Onset   Colon cancer Brother 68   Diabetes Father    Kidney failure Sister      SOCIAL HISTORY:  reports that she quit smoking about 14 years ago. Her smoking use included cigarettes. She has never used smokeless tobacco. She reports that she does not drink alcohol and does not use drugs.  The patient is married and lives in [place], [place]. She stayed at home with her children when they were  young.    ALLERGIES: Ciprofloxacin and Lipitor [atorvastatin]   MEDICATIONS:  Current Outpatient Medications  Medication Sig Dispense Refill   albuterol (VENTOLIN HFA) 108 (90 Base) MCG/ACT inhaler INHALE 1 TO 2 PUFFS EVERY 4 HOURS AS NEEDED FOR WHEEZING 8.5 g 2   benzonatate (TESSALON) 100 MG capsule Take 1 capsule (100 mg total) by mouth 2 (two) times daily as needed for cough. 21 capsule 0   Continuous Blood Gluc Sensor (DEXCOM G6 SENSOR) MISC Check sugars qac and qhs. 9 each 3   Continuous Blood Gluc Transmit (DEXCOM G6 TRANSMITTER) MISC Change transmitter every 3 months DX CODE:  E10.649 1 each 4   HYDROcodone bit-homatropine (HYDROMET) 5-1.5 MG/5ML syrup Take 5 mLs by mouth every 6 (six) hours as needed for cough. 120 mL 0   HYDROcodone-homatropine (HYCODAN) 5-1.5 MG/5ML syrup Take 5 mLs by mouth every 6 (six) hours as needed for cough. 120 mL 0   insulin aspart (NOVOLOG FLEXPEN) 100 UNIT/ML FlexPen Inject into the skin 3 (three) times daily with meals.     insulin degludec (TRESIBA FLEXTOUCH) 200 UNIT/ML FlexTouch Pen ADMINISTER 28 UNITS UNDER THE SKIN DAILY 6 mL 0   Insulin Pen Needle 32G X 4 MM MISC 1 each by Does not apply route 4 (four) times daily. 200 each 1   levothyroxine (SYNTHROID) 88 MCG tablet Take 1 tablet (88 mcg total) by mouth daily. 90 tablet  3   LORazepam (ATIVAN) 0.5 MG tablet TAKE 1 TABLET(0.5 MG) BY MOUTH DAILY AS NEEDED FOR ANXIETY 30 tablet 0   montelukast (SINGULAIR) 10 MG tablet Take 1 tablet (10 mg total) by mouth at bedtime. 90 tablet 0   ramipril (ALTACE) 10 MG capsule TAKE 1 CAPSULE(10 MG) BY MOUTH DAILY 90 capsule 0   rosuvastatin (CRESTOR) 40 MG tablet Take 1 tablet (40 mg total) by mouth daily. 90 tablet 0   No current facility-administered medications for this encounter.     REVIEW OF SYSTEMS: On review of systems, the patient reports that she is doing well overall.  She is having some discomfort since her biopsy. With exertion she often has  shortness of breath but no symptoms at rest today. She has an in situ glucose monitor, a Dexcom G6 device that gets changed every 10 days. No other complaints are verbalized. 3    PHYSICAL EXAM:  Wt Readings from Last 3 Encounters:  01/12/21 124 lb (56.2 kg)  12/29/20 120 lb (54.4 kg)  11/25/20 122 lb (55.3 kg)   Temp Readings from Last 3 Encounters:  01/12/21 97.9 F (36.6 C) (Temporal)  11/25/20 (!) 97.4 F (36.3 C)  05/02/20 (!) 97.2 F (36.2 C)   BP Readings from Last 3 Encounters:  01/12/21 108/62  12/29/20 110/70  11/25/20 118/60   Pulse Readings from Last 3 Encounters:  01/12/21 72  11/25/20 84  05/02/20 74    In general this is a well appearing caucasian female in no acute distress. She's alert and oriented x4 and appropriate throughout the examination. Cardiopulmonary assessment is negative for acute distress and she exhibits normal effort. Bilateral breast exam is deferred.    ECOG = 1  0 - Asymptomatic (Fully active, able to carry on all predisease activities without restriction)  1 - Symptomatic but completely ambulatory (Restricted in physically strenuous activity but ambulatory and able to carry out work of a light or sedentary nature. For example, light housework, office work)  2 - Symptomatic, <50% in bed during the day (Ambulatory and capable of all self care but unable to carry out any work activities. Up and about more than 50% of waking hours)  3 - Symptomatic, >50% in bed, but not bedbound (Capable of only limited self-care, confined to bed or chair 50% or more of waking hours)  4 - Bedbound (Completely disabled. Cannot carry on any self-care. Totally confined to bed or chair)  5 - Death   Eustace Pen MM, Creech RH, Tormey DC, et al. [phone]). "Toxicity and response criteria of the [facility]". [place] Oncol. 5 (6): 649-55    LABORATORY DATA:  Lab Results  Component Value Date   WBC 7.8 11/24/2020   HGB 14.3 11/24/2020    HCT 43.6 11/24/2020   MCV 90 11/24/2020   PLT 228 11/24/2020   Lab Results  Component Value Date   NA 142 11/24/2020   K 4.5 11/24/2020   CL 103 11/24/2020   CO2 24 11/24/2020   Lab Results  Component Value Date   ALT 14 11/24/2020   AST 18 11/24/2020   ALKPHOS 74 11/24/2020   BILITOT 0.2 11/24/2020      RADIOGRAPHY: MM Digital Diagnostic Unilat R  Result Date: 02/13/2021 CLINICAL DATA:  60 year old female for further evaluation of INNER RIGHT breast calcifications identified on screening mammogram. EXAM: DIGITAL DIAGNOSTIC UNILATERAL RIGHT MAMMOGRAM TECHNIQUE: Right digital diagnostic mammography was performed. Mammographic images were processed with CAD. COMPARISON:  02/02/2021  screening mammogram and 12/26/2007 mammogram ACR Breast Density Category c: The breast tissue is heterogeneously dense, which may obscure small masses. FINDINGS: Full field and magnification views of the RIGHT breast demonstrate a 3 cm group of pleomorphic and linear branching calcifications within the LOWER INNER RIGHT breast, middle depth. Scattered punctate calcifications throughout the OUTER RIGHT breast are unchanged from 2009 and considered benign. IMPRESSION: 3 cm group of suspicious INNER LOWER RIGHT breast calcifications. Tissue sampling is recommended. RECOMMENDATION: 3D/stereotactic guided RIGHT breast biopsy, which will be scheduled. I have discussed the findings and recommendations with the patient. If applicable, a reminder letter will be sent to the patient regarding the next appointment. BI-RADS CATEGORY  4: Suspicious. Electronically Signed   By: Margarette Canada M.D.   On: 02/13/2021 14:07   MM DIGITAL SCREENING BILATERAL  Result Date: 02/05/2021 CLINICAL DATA:  Screening. EXAM: DIGITAL SCREENING BILATERAL MAMMOGRAM WITH CAD TECHNIQUE: Bilateral screening digital craniocaudal and mediolateral oblique mammograms were obtained. The images were evaluated with computer-aided detection. COMPARISON:  Previous  exam(s). ACR Breast Density Category c: The breast tissue is heterogeneously dense, which may obscure small masses. FINDINGS: In the right breast, calcifications warrant further evaluation with magnified views. In the left breast, no findings suspicious for malignancy. IMPRESSION: Further evaluation is suggested for calcifications in the right breast. RECOMMENDATION: Diagnostic mammogram of the right breast. (Code:[id number]-93M) The patient will be contacted regarding the findings, and additional imaging will be scheduled. BI-RADS CATEGORY  0: Incomplete. Need additional imaging evaluation and/or prior mammograms for comparison. Electronically Signed   By: Lajean Manes M.D.   On: 02/05/2021 15:22   MM CLIP PLACEMENT RIGHT  Result Date: 02/16/2021 CLINICAL DATA:  Confirmation clip placement after stereotactic tomosynthesis core needle biopsy of a suspicious group of calcifications involving the LOWER INNER QUADRANT of the RIGHT breast. EXAM: 2D AND 3D DIAGNOSTIC RIGHT MAMMOGRAM POST STEREOTACTIC TOMOSYNTHESIS BIOPSY COMPARISON:  Previous exam(s). FINDINGS: Tomosynthesis and synthesized full field CC and mediolateral mammographic images were obtained following stereotactic tomosynthesis guided biopsy of calcifications involving the LOWER INNER QUADRANT of the RIGHT breast. The X shaped biopsy marking clip is appropriately positioned at the site of the biopsied calcifications in the LOWER INNER QUADRANT. Residual calcifications extend approximately 1.4 cm ANTERIOR to the clip, approximately 1.3 cm POSTERIOR to the clip, and approximately 1.2 cm LATERAL to the clip. Expected post biopsy changes are present without evidence of hematoma. IMPRESSION: 1. Appropriate positioning of the X shaped biopsy marking clip at the site of the biopsied calcifications in the LOWER INNER QUADRANT of the RIGHT breast. 2. Residual calcifications extend 1.4 cm ANTERIOR to the clip, 1.3 cm POSTERIOR to the clip, and 1.2 cm LATERAL to the  clip. As the calcifications span 3.0 cm in total, bracketed needle localization may be required if lumpectomy is ultimately performed. Final Assessment: Post Procedure Mammograms for Marker Placement Electronically Signed   By: Evangeline Dakin M.D.   On: 02/16/2021 12:58   MM RT BREAST BX W LOC DEV 1ST LESION IMAGE BX SPEC STEREO GUIDE  Addendum Date: 02/17/2021   ADDENDUM REPORT: 02/17/2021 12:26 ADDENDUM: Pathology revealed HIGH GRADE DUCTAL CARCINOMA IN SITU WITH CALCIFICATIONS AND NECROSIS of the Right breast, lower inner quadrant, middle depth, (x clip). This was found to be concordant by Dr. Peggye Fothergill. Pathology results were discussed with the patient by telephone. The patient reported doing well after the biopsy with tenderness at the site. Post biopsy instructions and care were reviewed and questions were answered. The patient  was encouraged to call [facility] for any additional concerns. My direct phone number was provided. The patient was referred to [facility] on February 25, 2021. Recommendation: Bilateral breast MRI for further evaluation of extent of disease given the high grade histology. Pathology results reported by Terie Purser, RN on 02/17/2021. Electronically Signed   By: Evangeline Dakin M.D.   On: 02/17/2021 12:26   Result Date: 02/17/2021 CLINICAL DATA:  60 year old with a screening detected 3.0 cm group of suspicious fine pleomorphic and linear branching calcifications involving the LOWER INNER QUADRANT of the RIGHT breast at MIDDLE depth. EXAM: RIGHT BREAST STEREOTACTIC TOMOSYNTHESIS CORE NEEDLE BIOPSY COMPARISON:  Previous exams. FINDINGS: The patient and I discussed the procedure of stereotactic-guided biopsy including benefits and alternatives. We discussed the high likelihood of a successful procedure. We discussed the risks of the procedure including infection, bleeding, tissue  injury, clip migration, and inadequate sampling. Informed written consent was given. The usual time out protocol was performed immediately prior to the procedure. Lesion quadrant: LOWER INNER QUADRANT. Using sterile technique with chlorhexidine as skin antisepsis, 1% Lidocaine as local anesthetic, under stereotactic guidance, a 9 gauge Brevera vacuum assisted device was used to perform core needle biopsy of calcifications involving the LOWER INNER QUADRANT of the RIGHT breast using a medial approach. Specimen radiograph was performed showing calcifications in all 5 of the core samples. Specimens with calcifications are identified for pathology. At the conclusion of the procedure, an X shaped tissue marker clip was deployed into the biopsy cavity. Follow-up 2-view mammogram was performed and dictated separately. IMPRESSION: Stereotactic tomosynthesis core needle biopsy of a suspicious 3.0 cm group calcifications involving the LOWER INNER QUADRANT of the RIGHT breast. No apparent complications. Electronically Signed: By: Evangeline Dakin M.D. On: 02/16/2021 11:37       IMPRESSION/PLAN: 1. High-grade, ER positive DCIS of the right breast. Dr. Lisbeth Renshaw discusses the pathology findings and reviews the nature of noninvasive breast disease. The consensus from the breast conference includes breast conservation with lumpectomy.  Dr. Lisbeth Renshaw discusses that she would benefit from external radiotherapy to the breast  to reduce risks of local recurrence followed by antiestrogen therapy with Dr. Lindi Adie. We discussed the risks, benefits, short, and long term effects of radiotherapy, as well as the curative intent, and the patient is interested in proceeding. Dr. Lisbeth Renshaw discusses the delivery and logistics of radiotherapy and anticipates a course of 4  weeks of radiotherapy. We will see her back a few weeks after surgery to discuss the simulation process and anticipate we starting radiotherapy about 4-6 weeks after surgery.   2. Glucose monitor. We will reach out to physics regarding her device, Dexcom G6 system. She would plan to place this on the left arm during treatment.  3. Possible genetic predisposition to malignancy. The patient is a candidate for genetic counseling discussion given her  family history. She was offered referral and is interested in meeting with genetics today.    In a visit lasting 60 minutes, greater than 50% of the time was spent face to face reviewing her case, as well as in preparation of, discussing, and coordinating the patient's care.  The above documentation reflects my direct findings during this shared patient visit. Please see the separate note by Dr. Lisbeth Renshaw on this date for the remainder of the patient's plan of care.    Carola Rhine, [facility]    **Disclaimer: This note was  dictated with voice recognition software. Similar sounding words can inadvertently be transcribed and this note may contain transcription errors which may not have been corrected upon publication of note.**

## 2021-02-26 ENCOUNTER — Other Ambulatory Visit: Payer: Self-pay | Admitting: General Surgery

## 2021-02-26 ENCOUNTER — Telehealth: Payer: BC Managed Care – PPO | Admitting: Physician Assistant

## 2021-02-26 ENCOUNTER — Ambulatory Visit: Payer: BC Managed Care – PPO | Admitting: Family Medicine

## 2021-02-26 DIAGNOSIS — C50311 Malignant neoplasm of lower-inner quadrant of right female breast: Secondary | ICD-10-CM

## 2021-02-26 NOTE — Progress Notes (Signed)
Virtual Visit via Video Note   This visit type was conducted due to national recommendations for restrictions regarding the COVID-19 Pandemic (e.g. social distancing) in an effort to limit this patient's exposure and mitigate transmission in our community.  Due to her co-morbid illnesses, this patient is at least at moderate risk for complications without adequate follow up.  This format is felt to be most appropriate for this patient at this time.  All issues noted in this document were discussed and addressed.  A limited physical exam was performed with this format.  A verbal consent was obtained for the virtual visit.   Date:  02/27/2021   ID:  Nicole Marquez, DOB Jun 28, 1961, MRN [id number]  Patient Location: Home Provider Location: Office/Clinic  PCP:  Rochel Brome, MD   Evaluation Performed:  Established patient, acute telemedicine visit  Chief Complaint:  Cough  History of Present Illness:    Nicole Marquez is a 60 y.o. female with cough, wheezing, and dyspnea. Onset was two-weeks-ago. Treatment has included Tessalon Perles, Albuterol inhaler.She was prescribed a course of antibiotics approximately 8-weeks-ago for sinusitis. She has a history of asthma and chronic allergic rhinitis. Former smoker, quit 2008. Recent breast cancer diagnosis.  Nema tells me she has been experiencing frequency and urgency. Encouraged pt to make in-office visit on 03/02/21 for lung assessment and UA.   The patient does have symptoms concerning for COVID-19 infection (fever, chills, cough, or new shortness of breath).    Past Medical History:  Diagnosis Date   Breast cancer ([place])    Exudative age-related macular degeneration, bilateral, stage unspecified ([place])    Family history of colon cancer 02/27/2021   Family history of malignant neoplasm of digestive organs    Mixed hyperlipidemia    Other specified hypothyroidism    Type 1 diabetes mellitus with hypoglycemia without coma ([place])      Past Surgical History:  Procedure Laterality Date   CESAREAN SECTION     x3   ENDOMETRIAL ABLATION     Right rotator cuff repair      Family History  Problem Relation Age of Onset   Cancer - Other Mother 38       carcinoid of colon   Diabetes Father    Kidney failure Sister    Colon cancer Brother 79   Lung cancer Paternal Aunt        dx >50; smoking hx   Cancer - Other Maternal Grandfather 51       carcinoid of colon    Social History   Socioeconomic History   Marital status: Married    Spouse name: Not on file   Number of children: 4   Years of education: Not on file   Highest education level: Not on file  Occupational History   Not on file  Tobacco Use   Smoking status: Former    Pack years: 0.00    Types: Cigarettes    Quit date: 2008    Years since quitting: 14.4   Smokeless tobacco: Never  Vaping Use   Vaping Use: Never used  Substance and Sexual Activity   Alcohol use: Never   Drug use: Never   Sexual activity: Not on file  Other Topics Concern   Not on file  Social History Narrative   Patient has 1 set of twins and two individual children.   Social Determinants of Health   Financial Resource Strain: Not on file  Food Insecurity: Not on file  Transportation Needs:  Not on file  Physical Activity: Not on file  Stress: Not on file  Social Connections: Not on file  Intimate Partner Violence: Not on file    Outpatient Medications Prior to Visit  Medication Sig Dispense Refill   Aflibercept (EYLEA IO) Inject into the eye. Q 8 weeks     albuterol (VENTOLIN HFA) 108 (90 Base) MCG/ACT inhaler INHALE 1 TO 2 PUFFS EVERY 4 HOURS AS NEEDED FOR WHEEZING 8.5 g 2   benzonatate (TESSALON) 100 MG capsule Take 1 capsule (100 mg total) by mouth 2 (two) times daily as needed for cough. 21 capsule 0   Continuous Blood Gluc Sensor (DEXCOM G6 SENSOR) MISC Check sugars qac and qhs. 9 each 3   Continuous Blood Gluc Transmit (DEXCOM G6 TRANSMITTER) MISC Change  transmitter every 3 months DX CODE:  E10.649 1 each 4   HYDROcodone bit-homatropine (HYDROMET) 5-1.5 MG/5ML syrup Take 5 mLs by mouth every 6 (six) hours as needed for cough. 120 mL 0   HYDROcodone-homatropine (HYCODAN) 5-1.5 MG/5ML syrup Take 5 mLs by mouth every 6 (six) hours as needed for cough. 120 mL 0   insulin aspart (NOVOLOG FLEXPEN) 100 UNIT/ML FlexPen Inject into the skin 3 (three) times daily with meals.     insulin degludec (TRESIBA FLEXTOUCH) 200 UNIT/ML FlexTouch Pen ADMINISTER 28 UNITS UNDER THE SKIN DAILY 6 mL 0   Insulin Pen Needle 32G X 4 MM MISC 1 each by Does not apply route 4 (four) times daily. 200 each 1   levothyroxine (SYNTHROID) 88 MCG tablet Take 1 tablet (88 mcg total) by mouth daily. 90 tablet 3   LORazepam (ATIVAN) 0.5 MG tablet TAKE 1 TABLET(0.5 MG) BY MOUTH DAILY AS NEEDED FOR ANXIETY 30 tablet 0   montelukast (SINGULAIR) 10 MG tablet Take 1 tablet (10 mg total) by mouth at bedtime. 90 tablet 0   ramipril (ALTACE) 10 MG capsule TAKE 1 CAPSULE(10 MG) BY MOUTH DAILY 90 capsule 0   rosuvastatin (CRESTOR) 40 MG tablet Take 1 tablet (40 mg total) by mouth daily. 90 tablet 0   No facility-administered medications prior to visit.    Allergies:   Ciprofloxacin and Lipitor [atorvastatin]   Social History   Tobacco Use   Smoking status: Former    Pack years: 0.00    Types: Cigarettes    Quit date: 2008    Years since quitting: 14.4   Smokeless tobacco: Never  Vaping Use   Vaping Use: Never used  Substance Use Topics   Alcohol use: Never   Drug use: Never     Review of Systems  Constitutional:  Positive for malaise/fatigue. Negative for chills and fever.  HENT:  Negative for congestion, ear pain, sinus pain and sore throat.   Eyes:  Negative for pain.  Respiratory:  Positive for cough, shortness of breath and wheezing.   Cardiovascular:  Negative for chest pain and orthopnea.  Gastrointestinal:  Negative for abdominal pain, constipation, diarrhea,  heartburn, nausea and vomiting.  Genitourinary:  Positive for frequency and urgency. Negative for dysuria.  Musculoskeletal:  Negative for back pain, joint pain and myalgias.  Skin:  Negative for rash.  Neurological:  Negative for dizziness and headaches.  Endo/Heme/Allergies:  Positive for environmental allergies.  Psychiatric/Behavioral:  Negative for depression. The patient is not nervous/anxious.     Labs/Other Tests and Data Reviewed:    Recent Labs: 11/24/2020: TSH 0.465 02/25/2021: ALT 15; BUN 12; Creatinine 0.78; Hemoglobin 13.1; Platelet Count 235; Potassium 4.5; Sodium 140   Recent  Lipid Panel Lab Results  Component Value Date/Time   CHOL 326 (H) 11/24/2020 08:27 AM   TRIG 103 11/24/2020 08:27 AM   HDL 63 11/24/2020 08:27 AM   CHOLHDL 5.2 (H) 11/24/2020 08:27 AM   LDLCALC 246 (H) 11/24/2020 08:27 AM    Wt Readings from Last 3 Encounters:  02/25/21 124 lb 9.6 oz (56.5 kg)  01/12/21 124 lb (56.2 kg)  12/29/20 120 lb (54.4 kg)     Objective:    Vital Signs:  There were no vitals taken for this visit.   Physical Exam No physical exam due to telemedicine visit  ASSESSMENT & PLAN:     1. Mild intermittent asthma with exacerbation - Budesonide (PULMICORT FLEXHALER) 90 MCG/ACT inhaler; Inhale 1 puff into the lungs 2 (two) times daily.  Dispense: 1 each; Refill: 3 -continue Albuterol inhaler as needed -seek emergency medical care for severe shortness of breath or any other concerning medical problems  2. Chronic allergic rhinitis -continue Singulair -continue OTC oral antihistamine  3. Cough - benzonatate (TESSALON) 200 MG capsule; Take 1 capsule (200 mg total) by mouth 3 (three) times daily as needed for cough.  Dispense: 33 capsule; Refill: 0  4. Urinary frequency  -push fluids, especially water -seek emergency medical care if symptoms worse -In office appt 03/02/21  Meds ordered this encounter  Medications   Budesonide (PULMICORT FLEXHALER) 90 MCG/ACT  inhaler    Sig: Inhale 1 puff into the lungs 2 (two) times daily.    Dispense:  1 each    Refill:  3    Order Specific Question:   Supervising Provider    AnswerShelton Silvas    COVID-19 Education: The signs and symptoms of COVID-19 were discussed with the patient and how to seek care for testing (follow up with PCP or arrange E-visit). The importance of social distancing was discussed today.   I spent 10 minutes dedicated to the care of this patient on the date of this encounter to include face-to-face time with the patient, as well as: EMR and prescription medication management.   Follow Up:  In Person in 2 day(s) 03/02/21  Signed, Rip Harbour, NP  02/27/2021 10:19 PM    [place]

## 2021-02-27 ENCOUNTER — Encounter: Payer: Self-pay | Admitting: Nurse Practitioner

## 2021-02-27 ENCOUNTER — Telehealth: Payer: BC Managed Care – PPO | Admitting: Nurse Practitioner

## 2021-02-27 ENCOUNTER — Encounter: Payer: Self-pay | Admitting: Genetic Counselor

## 2021-02-27 DIAGNOSIS — R350 Frequency of micturition: Secondary | ICD-10-CM | POA: Diagnosis not present

## 2021-02-27 DIAGNOSIS — J309 Allergic rhinitis, unspecified: Secondary | ICD-10-CM

## 2021-02-27 DIAGNOSIS — Z800 Family history of malignant neoplasm of digestive organs: Secondary | ICD-10-CM | POA: Insufficient documentation

## 2021-02-27 DIAGNOSIS — R059 Cough, unspecified: Secondary | ICD-10-CM | POA: Diagnosis not present

## 2021-02-27 DIAGNOSIS — J4521 Mild intermittent asthma with (acute) exacerbation: Secondary | ICD-10-CM | POA: Diagnosis not present

## 2021-02-27 HISTORY — DX: Family history of malignant neoplasm of digestive organs: Z80.0

## 2021-02-27 MED ORDER — PULMICORT FLEXHALER 90 MCG/ACT IN AEPB: 1.0000 | INHALATION_SPRAY | Freq: Two times a day (BID) | RESPIRATORY_TRACT | 3 refills | Status: DC

## 2021-02-27 NOTE — Progress Notes (Signed)
REFERRING PROVIDER: Nicholas Lose, MD [place],  [place] [id number]  PRIMARY PROVIDER:  Rochel Brome, MD  PRIMARY REASON FOR VISIT:  1. Ductal carcinoma in situ (DCIS) of right breast   2. Family history of colon cancer    HISTORY OF PRESENT ILLNESS:   [name], a 60 y.o. female, was seen for a [facility] cancer genetics consultation at the request of Dr. Lindi Adie due to a personal and family history of cancer.  Nicole Marquez presents to clinic today to discuss the possibility of a hereditary predisposition to cancer, to discuss genetic testing, and to further clarify her future cancer risks, as well as potential cancer risks for family members.   In June 2022, at the age of 14, Nicole Marquez was diagnosed with ductal carcinoma in situ of the right breast (ER+/PR-). The treatment plan includes breast conserving surgery, adjuvant radiation, and anti-estrogens.   CANCER HISTORY:  Oncology History  Ductal carcinoma in situ (DCIS) of right breast  02/19/2021 Initial Diagnosis   Screening mammogram on 02/02/2021 showed calcification in the right breast. Diagnostic mammogram and [place] on 02/13/2021 showed 3 cm group of suspicious inner lower right breast calcifications. 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    02/25/2021 Cancer Staging   Staging form: Breast, AJCC 8th Edition - Clinical stage from 02/25/2021: Stage 0 (cTis (DCIS), cN0, cM0, ER+, PR-) - Signed by Nicholas Lose, MD on 02/25/2021  Stage prefix: Initial diagnosis  Nuclear grade: GX      RISK FACTORS:  Menarche was at age 28.  First live birth at age 54.  OCP use for approximately 0 years.  Ovaries intact: yes.  Hysterectomy: no.  Menopausal status: postmenopausal.  HRT use: 0 years. Colonoscopy: yes;  most recent in 2010 . Mammogram within the last year: no. Up to date with pelvic exams: most recent PAP in  2017.  Past Medical History:  Diagnosis Date   Breast cancer ([place])    Exudative age-related macular degeneration, bilateral, stage unspecified ([place])    Family history of colon cancer 02/27/2021   Family history of malignant neoplasm of digestive organs    Mixed hyperlipidemia    Other specified hypothyroidism    Type 1 diabetes mellitus with hypoglycemia without coma ([place])     Past Surgical History:  Procedure Laterality Date   CESAREAN SECTION     x3   ENDOMETRIAL ABLATION     Right rotator cuff repair      Social History   Socioeconomic History   Marital status: Married    Spouse name: Not on file   Number of children: 4   Years of education: Not on file   Highest education level: Not on file  Occupational History   Not on file  Tobacco Use   Smoking status: Former    Pack years: 0.00    Types: Cigarettes    Quit date: 2008    Years since quitting: 14.4   Smokeless tobacco: Never  Vaping Use   Vaping Use: Never used  Substance and Sexual Activity   Alcohol use: Never   Drug use: Never   Sexual activity: Not on file  Other Topics Concern   Not on file  Social History Narrative   Patient has 1 set of twins and two individual children.   Social Determinants of Health   Financial Resource Strain: Not on file  Food Insecurity: Not  on file  Transportation Needs: Not on file  Physical Activity: Not on file  Stress: Not on file  Social Connections: Not on file     FAMILY HISTORY:  We obtained a detailed, 4-generation family history.  Significant diagnoses are listed below: Family History  Problem Relation Age of Onset   Cancer - Other Mother 53       carcinoid of colon   Colon cancer Brother 49   Lung cancer Paternal Aunt        dx >50; smoking hx   Cancer - Other Maternal Grandfather 72       carcinoid of colon    Nicole Marquez is unaware of previous family history of genetic testing for hereditary cancer risks. There is no reported Ashkenazi Jewish  ancestry. There is no known consanguinity.  GENETIC COUNSELING ASSESSMENT: Ms. Fair is a 60 y.o. female with a personal and family history of cancer which is not highly suggestive of a known hereditary cancer syndrome. We, therefore, discussed and recommended the following at today's visit.   DISCUSSION: We discussed that, in general, most cancer is not inherited in families, but instead is sporadic or familial. Sporadic cancers occur by chance and typically happen at older ages (>50 years) as this type of cancer is caused by genetic changes acquired during an individual's lifetime. Some families have more cancers than would be expected by chance; however, the ages or types of cancer are not consistent with a known genetic mutation or known genetic mutations have been ruled out. This type of familial cancer is thought to be due to a combination of multiple genetic, environmental, hormonal, and lifestyle factors. While this combination of factors likely increases the risk of cancer, the exact source of this risk is not currently identifiable or testable.    We discussed that approximately 5-10% of cancer is hereditary, meaning that it is due to a mutation in a single gene that is passed down from generation to generation in a family. Most hereditary cases of colon cancer are associated with Lynch.  We discussed that while some individuals with Lynch syndrome have been reported to have colon carcinoid tumors, they are not a known typical presentation for this hereditary cancer syndrome. Most hereditary cases of hereditary breast cancer are associated with mutaitons in BRCA1/2. There are other genes that can be associated an increased risk for breast or colon cancer. We discussed that testing can be beneficial for several reasons, including knowing about other cancer risks, identifying potential screening and risk-reduction options that may be appropriate, and to understand if other family members could be  at risk for cancer and allow them to undergo genetic testing.   We discussed with [name] that the personal and family history does not meet criteria for genetic testing and, therefore, is not highly consistent with a familial hereditary cancer syndrome.  We feel she is at low risk to harbor a gene mutation associated with such a condition.  We discussed that given the family history of colon carcinoid tumors in two generations along with having a brother at age 78 with colon cancer, it may reasonable for her to wish to have genetic testing.  We discussed the availability of genetic testing at a self-pay price.  She is not interested in pursuing genetic testing at this time.   PLAN: Ms. Alvarez did not wish to pursue genetic testing at today's visit. We understand this decision and remain available to coordinate genetic testing at any time in the  future. We, therefore, recommend Ms. Braun continue to follow the cancer screening guidelines given by her primary healthcare provider and oncology team.  Lastly, we encouraged Ms. Counce to remain in contact with cancer genetics so that we can continuously update the family history and inform her of any changes in cancer genetics and testing that may be of benefit for this family.   Ms. Plourde questions were answered to her satisfaction today. Our contact information was provided should additional questions or concerns arise. Thank you for the referral and allowing [place] to share in the care of your patient.   Janey Petron M. Joette Catching, [place], [facility] Genetic Counselor [email or web address] (P) [phone]  The patient was seen for a total of 20 minutes in face-to-face genetic counseling.  Patient was seen with her husband. Drs. Magrinat, Lindi Adie and/or Burr Medico were available to discuss this case as needed.    _______________________________________________________________________ For Office Staff:  Number of people involved in session: 2 Was an  Intern/ student involved with case: no

## 2021-02-28 MED ORDER — BENZONATATE 200 MG PO CAPS: 200.0000 mg | ORAL_CAPSULE | Freq: Three times a day (TID) | ORAL | 0 refills | Status: DC | PRN

## 2021-03-02 ENCOUNTER — Telehealth: Payer: Self-pay | Admitting: *Deleted

## 2021-03-02 ENCOUNTER — Encounter: Payer: Self-pay | Admitting: Nurse Practitioner

## 2021-03-02 ENCOUNTER — Encounter: Payer: Self-pay | Admitting: *Deleted

## 2021-03-02 ENCOUNTER — Other Ambulatory Visit: Payer: Self-pay | Admitting: Family Medicine

## 2021-03-02 ENCOUNTER — Other Ambulatory Visit: Payer: Self-pay

## 2021-03-02 ENCOUNTER — Ambulatory Visit: Payer: BC Managed Care – PPO | Admitting: Nurse Practitioner

## 2021-03-02 ENCOUNTER — Other Ambulatory Visit: Payer: Self-pay | Admitting: General Surgery

## 2021-03-02 VITALS — BP 106/68 | HR 76 | Temp 96.2°F | Resp 16 | Wt 125.0 lb

## 2021-03-02 DIAGNOSIS — N3000 Acute cystitis without hematuria: Secondary | ICD-10-CM | POA: Diagnosis not present

## 2021-03-02 DIAGNOSIS — D0511 Intraductal carcinoma in situ of right breast: Secondary | ICD-10-CM

## 2021-03-02 DIAGNOSIS — Z170 Estrogen receptor positive status [ER+]: Secondary | ICD-10-CM

## 2021-03-02 DIAGNOSIS — Z8619 Personal history of other infectious and parasitic diseases: Secondary | ICD-10-CM

## 2021-03-02 LAB — POCT URINALYSIS DIPSTICK
Bilirubin, UA: NEGATIVE
Blood, UA: NEGATIVE
Glucose, UA: NEGATIVE
Ketones, UA: NEGATIVE
Nitrite, UA: NEGATIVE
Protein, UA: POSITIVE — AB
Spec Grav, UA: >=1.03 — AB (ref 1.010–1.025)
Urobilinogen, UA: 0.2 E.U./dL
pH, UA: 6 (ref 5.0–8.0)

## 2021-03-02 MED ORDER — FLUCONAZOLE 150 MG PO TABS: 150.0000 mg | ORAL_TABLET | Freq: Once | ORAL | 0 refills | Status: AC

## 2021-03-02 MED ORDER — SULFAMETHOXAZOLE-TRIMETHOPRIM 800-160 MG PO TABS: 1.0000 | ORAL_TABLET | Freq: Two times a day (BID) | ORAL | 0 refills | Status: DC

## 2021-03-02 NOTE — Telephone Encounter (Signed)
Left vm regarding [facility] from 6.15.22. Contact information provided for questions or needs

## 2021-03-02 NOTE — Progress Notes (Signed)
Acute Office Visit  Subjective:    Patient ID: Nicole Marquez, female    DOB: 12/23/1960, 60 y.o.   MRN: [id number]  Chief Complaint  Patient presents with   Urinary frequency     HPI Patient is in today for urinary urgency and frequency. Onset of symptoms was 2-weeks ago. She denies taking any treatments for symptoms. Denies fever, chills, or back pain.   Past Medical History:  Diagnosis Date   Breast cancer ([place])    Exudative age-related macular degeneration, bilateral, stage unspecified ([place])    Family history of colon cancer 02/27/2021   Family history of malignant neoplasm of digestive organs    Mixed hyperlipidemia    Other specified hypothyroidism    Type 1 diabetes mellitus with hypoglycemia without coma ([place])     Past Surgical History:  Procedure Laterality Date   CESAREAN SECTION     x3   ENDOMETRIAL ABLATION     Right rotator cuff repair      Family History  Problem Relation Age of Onset   Cancer - Other Mother 58       carcinoid of colon   Diabetes Father    Kidney failure Sister    Colon cancer Brother 12   Lung cancer Paternal Aunt        dx >50; smoking hx   Cancer - Other Maternal Grandfather 85       carcinoid of colon    Social History   Socioeconomic History   Marital status: Married    Spouse name: Not on file   Number of children: 4   Years of education: Not on file   Highest education level: Not on file  Occupational History   Not on file  Tobacco Use   Smoking status: Former    Pack years: 0.00    Types: Cigarettes    Quit date: 2008    Years since quitting: 14.4   Smokeless tobacco: Never  Vaping Use   Vaping Use: Never used  Substance and Sexual Activity   Alcohol use: Never   Drug use: Never   Sexual activity: Not on file  Other Topics Concern   Not on file  Social History Narrative   Patient has 1 set of twins and two individual children.   Social Determinants of Health   Financial Resource Strain: Not on file   Food Insecurity: Not on file  Transportation Needs: Not on file  Physical Activity: Not on file  Stress: Not on file  Social Connections: Not on file  Intimate Partner Violence: Not on file    Outpatient Medications Prior to Visit  Medication Sig Dispense Refill   Aflibercept (EYLEA IO) Inject into the eye. Q 8 weeks     albuterol (VENTOLIN HFA) 108 (90 Base) MCG/ACT inhaler INHALE 1 TO 2 PUFFS EVERY 4 HOURS AS NEEDED FOR WHEEZING 8.5 g 2   benzonatate (TESSALON) 200 MG capsule Take 1 capsule (200 mg total) by mouth 3 (three) times daily as needed for cough. 33 capsule 0   Budesonide (PULMICORT FLEXHALER) 90 MCG/ACT inhaler Inhale 1 puff into the lungs 2 (two) times daily. 1 each 3   Continuous Blood Gluc Sensor (DEXCOM G6 SENSOR) MISC Check sugars qac and qhs. 9 each 3   Continuous Blood Gluc Transmit (DEXCOM G6 TRANSMITTER) MISC Change transmitter every 3 months DX CODE:  E10.649 1 each 4   HYDROcodone bit-homatropine (HYDROMET) 5-1.5 MG/5ML syrup Take 5 mLs by mouth every 6 (six) hours  as needed for cough. 120 mL 0   HYDROcodone-homatropine (HYCODAN) 5-1.5 MG/5ML syrup Take 5 mLs by mouth every 6 (six) hours as needed for cough. 120 mL 0   insulin aspart (NOVOLOG FLEXPEN) 100 UNIT/ML FlexPen Inject into the skin 3 (three) times daily with meals.     insulin degludec (TRESIBA FLEXTOUCH) 200 UNIT/ML FlexTouch Pen ADMINISTER 28 UNITS UNDER THE SKIN DAILY 6 mL 0   Insulin Pen Needle 32G X 4 MM MISC 1 each by Does not apply route 4 (four) times daily. 200 each 1   levothyroxine (SYNTHROID) 88 MCG tablet Take 1 tablet (88 mcg total) by mouth daily. 90 tablet 3   LORazepam (ATIVAN) 0.5 MG tablet TAKE 1 TABLET(0.5 MG) BY MOUTH DAILY AS NEEDED FOR ANXIETY 30 tablet 0   montelukast (SINGULAIR) 10 MG tablet Take 1 tablet (10 mg total) by mouth at bedtime. 90 tablet 0   ramipril (ALTACE) 10 MG capsule TAKE 1 CAPSULE(10 MG) BY MOUTH DAILY 90 capsule 0   rosuvastatin (CRESTOR) 40 MG tablet Take 1  tablet (40 mg total) by mouth daily. 90 tablet 0   No facility-administered medications prior to visit.    Allergies  Allergen Reactions   Ciprofloxacin    Lipitor [Atorvastatin]     Muscle pain.      Review of Systems  Constitutional:  Negative for chills and fever.  HENT:  Negative for congestion and sore throat.   Respiratory:  Negative for cough and shortness of breath.   Cardiovascular:  Negative for chest pain.  Gastrointestinal:  Negative for abdominal pain and nausea.  Genitourinary:  Positive for frequency and urgency. Negative for dysuria and hematuria.  Musculoskeletal:  Negative for back pain.      Objective:    Physical Exam Vitals reviewed.  Constitutional:      Appearance: Normal appearance.  HENT:     Head: Normocephalic.  Cardiovascular:     Rate and Rhythm: Normal rate and regular rhythm.     Pulses: Normal pulses.     Heart sounds: Normal heart sounds.  Pulmonary:     Effort: Pulmonary effort is normal.     Breath sounds: Normal breath sounds.  Abdominal:     General: Bowel sounds are normal.     Palpations: Abdomen is soft.  Skin:    Capillary Refill: Capillary refill takes less than 2 seconds.  Neurological:     General: No focal deficit present.     Mental Status: She is alert and oriented to person, place, and time.  Psychiatric:        Mood and Affect: Mood normal.        Behavior: Behavior normal.   BP 106/68   Pulse 76   Temp (!) 96.2 F (35.7 C)   Resp 16   Wt 125 lb (56.7 kg)   SpO2 99%   BMI 23.62 kg/m  Wt Readings from Last 3 Encounters:  03/02/21 125 lb (56.7 kg)  02/25/21 124 lb 9.6 oz (56.5 kg)  01/12/21 124 lb (56.2 kg)    Health Maintenance Due  Topic Date Due   PNEUMOCOCCAL POLYSACCHARIDE VACCINE AGE 27-64 HIGH RISK  Never done   COVID-19 Vaccine (1) Never done   Pneumococcal Vaccine 54-58 Years old (1 - PCV) Never done   OPHTHALMOLOGY EXAM  Never done   HIV Screening  Never done   Hepatitis C Screening  Never  done   TETANUS/TDAP  Never done   Zoster Vaccines- Shingrix (1 of 2)  Never done   PAP SMEAR-Modifier  Never done   COLONOSCOPY (Pts 45-11yr Insurance coverage will need to be confirmed)  Never done    Lab Results  Component Value Date   TSH 0.465 11/24/2020   Lab Results  Component Value Date   WBC 7.5 02/25/2021   HGB 13.1 02/25/2021   HCT 38.9 02/25/2021   MCV 87.0 02/25/2021   PLT 235 02/25/2021   Lab Results  Component Value Date   NA 140 02/25/2021   K 4.5 02/25/2021   CO2 28 02/25/2021   GLUCOSE 131 (H) 02/25/2021   BUN 12 02/25/2021   CREATININE 0.78 02/25/2021   BILITOT 0.4 02/25/2021   ALKPHOS 65 02/25/2021   AST 18 02/25/2021   ALT 15 02/25/2021   PROT 6.4 (L) 02/25/2021   ALBUMIN 4.0 02/25/2021   CALCIUM 9.8 02/25/2021   ANIONGAP 7 02/25/2021   EGFR 77 11/24/2020   Lab Results  Component Value Date   CHOL 326 (H) 11/24/2020   Lab Results  Component Value Date   HDL 63 11/24/2020   Lab Results  Component Value Date   LDLCALC 246 (H) 11/24/2020   Lab Results  Component Value Date   TRIG 103 11/24/2020   Lab Results  Component Value Date   CHOLHDL 5.2 (H) 11/24/2020   Lab Results  Component Value Date   HGBA1C 6.8 (H) 11/24/2020       Assessment & Plan:  1. Acute cystitis without hematuria - Urine Culture - POCT urinalysis dipstick     Take Bactrim DS twice daily for 7 days Take Diflucan 150 mg one time for vaginal yeast infection Push fluids, especially water Follow-up as needed  I spent 10 minutes dedicated to the care of this patient on the date of this encounter to include face-to-face time with the patient, as well as: EMR and prescription medication management.  I, SRip Harbour NP, have reviewed all documentation for this visit. The documentation on 03/02/21 for the exam, diagnosis, procedures, and orders are all accurate and complete.   Follow-up: PRN  An After Visit Summary was printed and given to the  patient.  Signed, SRip Harbour NP [phone]

## 2021-03-02 NOTE — Patient Instructions (Signed)
Take Bactrim DS twice daily for 7 days Take Diflucan 150 mg one time for vaginal yeast infection Follow-up as needed   Urinary Tract Infection, Adult A urinary tract infection (UTI) is an infection of any part of the urinary tract. The urinary tract includes: The kidneys. The ureters. The bladder. The urethra. These organs make, store, and get rid of pee (urine) in the body. What are the causes? This infection is caused by germs (bacteria) in your genital area. These germs grow and cause swelling (inflammation) of your urinary tract. What increases the risk? The following factors may make you more likely to develop this condition: Using a small, thin tube (catheter) to drain pee. Not being able to control when you pee or poop (incontinence). Being female. If you are female, these things can increase the risk: Using these methods to prevent pregnancy: A medicine that kills sperm (spermicide). A device that blocks sperm (diaphragm). Having low levels of a female hormone (estrogen). Being pregnant. You are more likely to develop this condition if: You have genes that add to your risk. You are sexually active. You take antibiotic medicines. You have trouble peeing because of: A prostate that is bigger than normal, if you are female. A blockage in the part of your body that drains pee from the bladder. A kidney stone. A nerve condition that affects your bladder. Not getting enough to drink. Not peeing often enough. You have other conditions, such as: Diabetes. A weak disease-fighting system (immune system). Sickle cell disease. Gout. Injury of the spine. What are the signs or symptoms? Symptoms of this condition include: Needing to pee right away. Peeing small amounts often. Pain or burning when peeing. Blood in the pee. Pee that smells bad or not like normal. Trouble peeing. Pee that is cloudy. Fluid coming from the vagina, if you are female. Pain in the belly or lower  back. Other symptoms include: Vomiting. Not feeling hungry. Feeling mixed up (confused). This may be the first symptom in older adults. Being tired and grouchy (irritable). A fever. Watery poop (diarrhea). How is this treated? Taking antibiotic medicine. Taking other medicines. Drinking enough water. In some cases, you may need to see a specialist. Follow these instructions at home:  Medicines Take over-the-counter and prescription medicines only as told by your doctor. If you were prescribed an antibiotic medicine, take it as told by your doctor. Do not stop taking it even if you start to feel better. General instructions Make sure you: Pee until your bladder is empty. Do not hold pee for a long time. Empty your bladder after sex. Wipe from front to back after peeing or pooping if you are a female. Use each tissue one time when you wipe. Drink enough fluid to keep your pee pale yellow. Keep all follow-up visits. Contact a doctor if: You do not get better after 1-2 days. Your symptoms go away and then come back. Get help right away if: You have very bad back pain. You have very bad pain in your lower belly. You have a fever. You have chills. You feeling like you will vomit or you vomit. Summary A urinary tract infection (UTI) is an infection of any part of the urinary tract. This condition is caused by germs in your genital area. There are many risk factors for a UTI. Treatment includes antibiotic medicines. Drink enough fluid to keep your pee pale yellow. This information is not intended to replace advice given to you by your health care provider.  Make sure you discuss any questions you have with your healthcare provider. Document Revised: 04/11/2020 Document Reviewed: 04/11/2020 Elsevier Patient Education  [employer].

## 2021-03-03 ENCOUNTER — Telehealth: Payer: Self-pay | Admitting: Hematology and Oncology

## 2021-03-03 NOTE — Telephone Encounter (Signed)
Scheduled appt per 6/20 sch msg. Called pt, no answer. Left msg with appt date and time.

## 2021-03-04 LAB — URINE CULTURE

## 2021-03-04 LAB — SPECIMEN STATUS REPORT

## 2021-03-05 ENCOUNTER — Encounter: Payer: Self-pay | Admitting: *Deleted

## 2021-03-05 NOTE — Pre-Procedure Instructions (Addendum)
Surgical Instructions    Your procedure is scheduled on Friday July 1st.  Report to [facility] Main Entrance "A" at 07:30 A.M., then check in with the Admitting office.  Call this number if you have problems the morning of surgery:  [phone]   If you have any questions prior to your surgery date call [phone]: Open Monday-Friday 8am-4pm    Remember:  Do not eat after midnight the night before your surgery  You may drink clear liquids until 06:30 A.M. the morning of your surgery.   Clear liquids allowed are: Water, Non-Citrus Juices (without pulp), Carbonated Beverages, Clear Tea, Black Coffee Only, and Gatorade  Patient Instructions  The night before surgery:  No food after midnight. ONLY clear liquids after midnight  The day of surgery (if you have diabetes): Drink ONE (1) 12 oz G2 given to you in your pre admission testing appointment by 06:30 A.M. the morning of surgery. Drink in one sitting. Do not sip.  This drink was given to you during your hospital  pre-op appointment visit.  Nothing else to drink after completing the  12 oz bottle of G2.         If you have questions, please contact your surgeon's office.   Take these medicines the morning of surgery with A SIP OF WATER   levothyroxine (SYNTHROID)  rosuvastatin (CRESTOR  albuterol (VENTOLIN HFA)- If needed  benzonatate (TESSALON)- IF needed  LORazepam (ATIVAN)- If needed  Budesonide (PULMICORT FLEXHALER)   WHAT DO I DO ABOUT MY DIABETES MEDICATION?   Do not take oral diabetes medicines (pills) the morning of surgery.  THE NIGHT BEFORE SURGERY, do not take bedtime dose of insulin aspart (NOVOLOG FLEXPEN)      THE MORNING OF SURGERY, take 80% (22 units) of insulin degludec (TRESIBA FLEXTOUCH). THE MORNING OF SURGERY DO NOT TAKE insulin aspart (NOVOLOG FLEXPEN)  The day of surgery, do not take other diabetes injectables, including Byetta (exenatide), Bydureon (exenatide ER), Victoza (liraglutide), or  Trulicity (dulaglutide).  If your CBG is greater than 220 mg/dL, you may take  of your sliding scale (correction) dose of insulin aspart (NOVOLOG FLEXPEN)   HOW TO MANAGE YOUR DIABETES BEFORE AND AFTER SURGERY  Why is it important to control my blood sugar before and after surgery? Improving blood sugar levels before and after surgery helps healing and can limit problems. A way of improving blood sugar control is eating a healthy diet by:  Eating less sugar and carbohydrates  Increasing activity/exercise  Talking with your doctor about reaching your blood sugar goals High blood sugars (greater than 180 mg/dL) can raise your risk of infections and slow your recovery, so you will need to focus on controlling your diabetes during the weeks before surgery. Make sure that the doctor who takes care of your diabetes knows about your planned surgery including the date and location.  How do I manage my blood sugar before surgery? Check your blood sugar at least 4 times a day, starting 2 days before surgery, to make sure that the level is not too high or low.  Check your blood sugar the morning of your surgery when you wake up and every 2 hours until you get to the Short Stay unit.  If your blood sugar is less than 70 mg/dL, you will need to treat for low blood sugar: Do not take insulin. Treat a low blood sugar (less than 70 mg/dL) with  cup of clear juice (cranberry or apple), 4 glucose tablets, OR  glucose gel. Recheck blood sugar in 15 minutes after treatment (to make sure it is greater than 70 mg/dL). If your blood sugar is not greater than 70 mg/dL on recheck, call [phone] for further instructions. Report your blood sugar to the short stay nurse when you get to Short Stay.  If you are admitted to the hospital after surgery: Your blood sugar will be checked by the staff and you will probably be given insulin after surgery (instead of oral diabetes medicines) to make sure you have good  blood sugar levels. The goal for blood sugar control after surgery is 80-180 mg/dL.    As of today, STOP taking any Aspirin (unless otherwise instructed by your surgeon) Aleve, Naproxen, Ibuprofen, Motrin, Advil, Goody's, BC's, all herbal medications, fish oil, and all vitamins.          Do not wear jewelry or makeup Do not wear lotions, powders, perfumes/colognes, or deodorant. Do not shave 48 hours prior to surgery.  Do not bring valuables to the hospital. DO Not wear nail polish, gel polish, artificial nails, or any other type of covering on natural nails including finger and toenails. If patients have artificial nails, gel coating, etc. that need to be removed by a nail salon please have this removed prior to surgery or surgery may need to be canceled/delayed if the surgeon/ anesthesia feels like the patient is unable to be adequately monitored.             [facility] is not responsible for any belongings or valuables.  Do NOT Smoke (Tobacco/Vaping) or drink Alcohol 24 hours prior to your procedure If you use a CPAP at night, you may bring all equipment for your overnight stay.   Contacts, glasses, dentures or partials may not be worn into surgery, please bring cases for these belongings   For patients admitted to the hospital, discharge time will be determined by your treatment team.   Patients discharged the day of surgery will not be allowed to drive home, and someone needs to stay with them for 24 hours.  ONLY 1 SUPPORT PERSON MAY BE PRESENT WHILE YOU ARE IN SURGERY. IF YOU ARE TO BE ADMITTED ONCE YOU ARE IN YOUR ROOM YOU WILL BE ALLOWED TWO (2) VISITORS.  Minor children may have two parents present. Special consideration for safety and communication needs will be reviewed on a case by case basis.  Special instructions:    Oral Hygiene is also important to reduce your risk of infection.  Remember - BRUSH YOUR TEETH THE MORNING OF SURGERY WITH YOUR REGULAR TOOTHPASTE   [facility]- Preparing For Surgery  Before surgery, you can play an important role. Because skin is not sterile, your skin needs to be as free of germs as possible. You can reduce the number of germs on your skin by washing with CHG (chlorahexidine gluconate) Soap before surgery.  CHG is an antiseptic cleaner which kills germs and bonds with the skin to continue killing germs even after washing.     Please do not use if you have an allergy to CHG or antibacterial soaps. If your skin becomes reddened/irritated stop using the CHG.  Do not shave (including legs and underarms) for at least 48 hours prior to first CHG shower. It is OK to shave your face.  Please follow these instructions carefully.     Shower the NIGHT BEFORE SURGERY and the MORNING OF SURGERY with CHG Soap.   If you chose to wash your hair, wash  your hair first as usual with your normal shampoo. After you shampoo, rinse your hair and body thoroughly to remove the shampoo.  Then [employer] and genitals (private parts) with your normal soap and rinse thoroughly to remove soap.  After that Use CHG Soap as you would any other liquid soap. You can apply CHG directly to the skin and wash gently with a scrungie or a clean washcloth.   Apply the CHG Soap to your body ONLY FROM THE NECK DOWN.  Do not use on open wounds or open sores. Avoid contact with your eyes, ears, mouth and genitals (private parts). Wash Face and genitals (private parts)  with your normal soap.   Wash thoroughly, paying special attention to the area where your surgery will be performed.  Thoroughly rinse your body with warm water from the neck down.  DO NOT shower/wash with your normal soap after using and rinsing off the CHG Soap.  Pat yourself dry with a CLEAN TOWEL.  Wear CLEAN PAJAMAS to bed the night before surgery  Place CLEAN SHEETS on your bed the night before your surgery  DO NOT SLEEP WITH PETS.   Day of Surgery:  Take a shower with CHG soap. Wear  Clean/Comfortable clothing the morning of surgery Do not apply any deodorants/lotions.   Remember to brush your teeth WITH YOUR REGULAR TOOTHPASTE.   Please read over the following fact sheets that you were given.

## 2021-03-06 ENCOUNTER — Encounter (HOSPITAL_COMMUNITY): Payer: Self-pay

## 2021-03-06 ENCOUNTER — Encounter (HOSPITAL_COMMUNITY)
Admission: RE | Admit: 2021-03-06 | Discharge: 2021-03-06 | Disposition: A | Discharge status: Home / Self Care | Payer: BC Managed Care – PPO | Source: Ambulatory Visit | Attending: General Surgery | Admitting: General Surgery

## 2021-03-06 ENCOUNTER — Other Ambulatory Visit: Payer: Self-pay

## 2021-03-06 ENCOUNTER — Encounter (HOSPITAL_COMMUNITY): Payer: Self-pay | Admitting: Vascular Surgery

## 2021-03-06 DIAGNOSIS — C50911 Malignant neoplasm of unspecified site of right female breast: Secondary | ICD-10-CM | POA: Diagnosis not present

## 2021-03-06 DIAGNOSIS — Z794 Long term (current) use of insulin: Secondary | ICD-10-CM | POA: Diagnosis not present

## 2021-03-06 DIAGNOSIS — E108 Type 1 diabetes mellitus with unspecified complications: Secondary | ICD-10-CM | POA: Diagnosis not present

## 2021-03-06 DIAGNOSIS — Z01818 Encounter for other preprocedural examination: Secondary | ICD-10-CM | POA: Insufficient documentation

## 2021-03-06 HISTORY — DX: Unspecified asthma, uncomplicated: J45.909

## 2021-03-06 LAB — GLUCOSE, CAPILLARY: Glucose-Capillary: 126 mg/dL — ABNORMAL HIGH (ref 70–99)

## 2021-03-06 NOTE — Progress Notes (Signed)
PCP - Dr. Dirk Dress at [facility] Cardiologist - denies  PPM/ICD - n/a Device Orders - n/a Rep Notified - n/a  Chest x-ray - n/a EKG - 03/06/21 Stress Test - denies ECHO - denies Cardiac Cath - denies  Sleep Study - denies CPAP - n/a  Fasting Blood Sugar - 126 Checks Blood Sugar: Patient wears a DexCom G6. States BS ranges from 100-200 during the day and 80-100 at night.   Blood Thinner Instructions: n/a Aspirin Instructions: n/a  ERAS Protcol - Yes PRE-SURGERY Ensure or G2- G2  COVID TEST- No. Ambulatory Surgery   Anesthesia review: Yes. EKG review. Seed lumpectomy.  Patient denies shortness of breath, fever, cough and chest pain at PAT appointment   All instructions explained to the patient, with a verbal understanding of the material. Patient agrees to go over the instructions while at home for a better understanding. Patient also instructed to self quarantine after being tested for COVID-19. The opportunity to ask questions was provided.

## 2021-03-07 ENCOUNTER — Ambulatory Visit
Admission: RE | Admit: 2021-03-07 | Discharge: 2021-03-07 | Disposition: A | Discharge status: Home / Self Care | Payer: BC Managed Care – PPO | Source: Ambulatory Visit | Attending: Hematology and Oncology | Admitting: Hematology and Oncology

## 2021-03-07 DIAGNOSIS — D0511 Intraductal carcinoma in situ of right breast: Secondary | ICD-10-CM

## 2021-03-07 MED ORDER — GADOBUTROL 1 MMOL/ML IV SOLN
5.0000 mL | Freq: Once | INTRAVENOUS | Status: AC | PRN
  Administered 2021-03-07: 5 mL via INTRAVENOUS

## 2021-03-09 NOTE — Anesthesia Preprocedure Evaluation (Deleted)
Anesthesia Evaluation    Airway        Dental   Pulmonary former smoker,           Cardiovascular      Neuro/Psych    GI/Hepatic   Endo/Other  diabetes  Renal/GU      Musculoskeletal   Abdominal   Peds  Hematology   Anesthesia Other Findings   Reproductive/Obstetrics                             Anesthesia Physical Anesthesia Plan  ASA:   Anesthesia Plan:    Post-op Pain Management:    Induction:   PONV Risk Score and Plan:   Airway Management Planned:   Additional Equipment:   Intra-op Plan:   Post-operative Plan:   Informed Consent:   Plan Discussed with:   Anesthesia Plan Comments: (See APP note by Durel Salts, FNP )        Anesthesia Quick Evaluation

## 2021-03-09 NOTE — Progress Notes (Signed)
Anesthesia Chart Review:   Case: [id number] Date/Time: 03/13/21 0915   Procedure: RIGHT BREAST LUMPECTOMY WITH RADIOACTIVE SEED LOCALIZATION (Right: Breast)   Anesthesia type: General   Pre-op diagnosis: RIGHT BREAST CANCER   Location: [place] OR ROOM 02 / [place] OR   Surgeons: [name], MD       DISCUSSION: Pt is 60 years old with hx DM type 1   VS: BP 127/63   Pulse (!) 57   Temp 36.9 C   Resp 17   Ht 5\' 1"  (1.549 m)   Wt 56.6 kg   LMP 04/22/2014 (Exact Date)   SpO2 100%   BMI 23.56 kg/m   PROVIDERS: - PCP is [facility], Elnita Maxwell, MD   LABS: Labs reviewed: Acceptable for surgery. (all labs ordered are listed, but only abnormal results are displayed)  Labs Reviewed  GLUCOSE, CAPILLARY - Abnormal; Notable for the following components:      Result Value   Glucose-Capillary 126 (*)    All other components within normal limits    EKG 03/06/21: NSR with sinus arrhythmia. Low voltage QRS. Septal infarct , age undetermined - No prior EKG for comparison. Pt did not report CV symptoms at pre-admission testing.    CV: N/A  Past Medical History:  Diagnosis Date   Asthma    Breast cancer ([place]) 2022   Exudative age-related macular degeneration, bilateral, stage unspecified ([place])    Family history of colon cancer 02/27/2021   Family history of malignant neoplasm of digestive organs    Mixed hyperlipidemia    Other specified hypothyroidism    Type 1 diabetes mellitus with hypoglycemia without coma ([place])     Past Surgical History:  Procedure Laterality Date   CESAREAN SECTION     x3   ENDOMETRIAL ABLATION     Right rotator cuff repair      MEDICATIONS:  Aflibercept (EYLEA) 2 MG/0.05ML SOLN   albuterol (VENTOLIN HFA) 108 (90 Base) MCG/ACT inhaler   benzonatate (TESSALON) 200 MG capsule   Budesonide (PULMICORT FLEXHALER) 90 MCG/ACT inhaler   Continuous Blood Gluc Sensor (DEXCOM G6 SENSOR) MISC   Continuous Blood Gluc Transmit (DEXCOM G6 TRANSMITTER) MISC   insulin aspart  (NOVOLOG FLEXPEN) 100 UNIT/ML FlexPen   insulin degludec (TRESIBA FLEXTOUCH) 200 UNIT/ML FlexTouch Pen   Insulin Pen Needle 32G X 4 MM MISC   levothyroxine (SYNTHROID) 88 MCG tablet   LORazepam (ATIVAN) 0.5 MG tablet   montelukast (SINGULAIR) 10 MG tablet   ramipril (ALTACE) 10 MG capsule   rosuvastatin (CRESTOR) 40 MG tablet   sulfamethoxazole-trimethoprim (BACTRIM DS) 800-160 MG tablet   No current facility-administered medications for this encounter.    If no changes, I anticipate pt can proceed with surgery as scheduled.   Willeen Cass, PhD, FNP-BC [facility]/Anesthesiology Phone: [phone] 03/09/2021 4:08 PM

## 2021-03-10 NOTE — H&P (Signed)
Nicole Marquez Appointment: 02/25/2021 1:00 PM Location: [facility] Patient #: [id number] DOB: [birth year] Undefined / Language: Nicole Marquez / Race: White Female   History of Present Illness [name] MD; 02/25/2021 4:02 PM) The patient is a 60 year old female who presents with breast cancer. Pt is a 60 yo F who is referred for consultation by Dr. Purcell Nails for a new diagnosis of right breast cancer 02/2021.  the patient presented with screening detected right breast calcifications.  Diagnostic imaging confirmed this with 3 cm of calcs seen in the LIQ on the right.  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    She hasn't had cancer before this dx.  She has some family history of cancer with her mother and maternal grandfather having colon carcinoid tumors, a brother with colon cancer, and a mother aunt with lung cancer. She had menarche at age 33. Menopause was age 12.  She is a G4P4 with first child at age 68.  She hasn't used HRT or hormonal contraception.    She is a former smoker who quit 10/2020.  She doesn't have alcohol or drug use history.  her last colonoscopy was in 2010.  she hasn't ever had a bone density study.     Diagnostic mammogram/us 02/13/21 ACR Breast Density Category c: The breast tissue is heterogeneously dense, which may obscure small masses.  FINDINGS: Full field and magnification views of the RIGHT breast demonstrate a 3 cm group of pleomorphic and linear branching calcifications within the LOWER INNER RIGHT breast, middle depth.  Scattered punctate calcifications throughout the OUTER RIGHT breast are unchanged from 2009 and considered benign.  IMPRESSION: 3 cm group of suspicious INNER LOWER RIGHT breast calcifications. Tissue sampling is recommended.  RECOMMENDATION: 3D/stereotactic guided RIGHT breast biopsy, which will be scheduled.  I have discussed the findings and recommendations with the patient. If applicable, a reminder letter  will be sent to the patient regarding the next appointment.  BI-RADS CATEGORY  4: Suspicious.   pathology core needle biopsy 02/16/21 DCIS with calcifications and necrosis, high grade ER 60% positive, moderate intensity PR 0% negative    Labs 02/25/21 CBC and CMET essentially normal other than glucose of 131.     Past Surgical History [name], RN; 02/25/2021 8:10 AM) Cesarean Section - Multiple   Shoulder Surgery   Right.  Diagnostic Studies History [name], RN; 02/25/2021 8:10 AM) Colonoscopy   >10 years ago Mammogram   within last year Pap Smear   1-5 years ago  Medication History [name], RN; 02/25/2021 8:09 AM) Medications Reconciled   Social History [name], RN; 02/25/2021 8:10 AM) Alcohol use   Occasional alcohol use. Caffeine use   Coffee. No drug use    Family History [name], RN; 02/25/2021 8:10 AM) Colon Cancer   Brother, Mother. Diabetes Mellitus   Father.  Pregnancy / Birth History [name], RN; 02/25/2021 8:10 AM) Age at menarche   24 years. Age of menopause   59-50 Gravida   3 Irregular periods   Maternal age   84-20 Para   26  Other Problems [name], RN; 02/25/2021 8:10 AM) Asthma   Diabetes Mellitus   Other disease, cancer, significant illness   Thyroid Disease      Review of Systems [name] RN; 02/25/2021 8:10 AM) General Present- Fatigue and Weight Gain. Not Present- Appetite Loss, Chills, Fever, Night Sweats and Weight Loss. HEENT Present- Seasonal Allergies. Not Present- Earache, Hearing Loss, Hoarseness, Nose  Bleed, Oral Ulcers, Ringing in the Ears, Sinus Pain, Sore Throat, Visual Disturbances, Wears glasses/contact lenses and Yellow Eyes. Respiratory Present- Difficulty Breathing. Not Present- Bloody sputum, Chronic Cough, Snoring and Wheezing. Breast Not Present- Breast Mass, Breast Pain, Nipple Discharge and Skin Changes. Cardiovascular Not Present- Chest Pain, Difficulty Breathing Lying Down, Leg Cramps,  Palpitations, Rapid Heart Rate, Shortness of Breath and Swelling of Extremities. Gastrointestinal Not Present- Abdominal Pain, Bloating, Bloody Stool, Change in Bowel Habits, Chronic diarrhea, Constipation, Difficulty Swallowing, Excessive gas, Gets full quickly at meals, Hemorrhoids, Indigestion, Nausea, Rectal Pain and Vomiting. Female Genitourinary Present- Frequency and Painful Urination. Not Present- Nocturia, Pelvic Pain and Urgency. Musculoskeletal Not Present- Back Pain, Joint Pain, Joint Stiffness, Muscle Pain, Muscle Weakness and Swelling of Extremities. Neurological Not Present- Decreased Memory, Fainting, Headaches, Numbness, Seizures, Tingling, Tremor, Trouble walking and Weakness. Psychiatric Not Present- Anxiety, Bipolar, Change in Sleep Pattern, Depression, Fearful and Frequent crying. Endocrine Not Present- Cold Intolerance, Excessive Hunger, Hair Changes, Heat Intolerance, Hot flashes and New Diabetes. Hematology Not Present- Blood Thinners, Easy Bruising, Excessive bleeding, Gland problems, HIV and Persistent Infections.  Vitals [name] MD; 02/25/2021 3:53 PM) 02/25/2021 3:53 PM Weight: 124.6 lb   Height: 61 in  Body Surface Area: 1.54 m   Body Mass Index: 23.54 kg/m   Temp.: 98.1 F    Pulse: 96 (Regular)    BP: 145/64(Sitting, Left Arm, Standard)       Physical Exam [name] MD; 02/25/2021 4:04 PM) General Mental Status - Alert. General Appearance - Consistent with stated age. Hydration - Well hydrated. Voice - Normal.  Head and Neck Head - normocephalic, atraumatic with no lesions or palpable masses. Trachea - midline. Thyroid Gland Characteristics - normal size and consistency.  Eye Eyeball - Bilateral - Extraocular movements intact. Sclera/Conjunctiva - Bilateral - No scleral icterus.  Chest and Lung Exam Chest and lung exam reveals  - quiet, even and easy respiratory effort with no use of accessory muscles and on auscultation, normal breath  sounds, no adventitious sounds and normal vocal resonance. Inspection Chest Wall - Normal. Back - normal.  Breast Note:  tenderness and bruising inferomedially on the right breast. no palpable masses. no nipple retraction or nipple discharge. no LAD. breasts are relatively dense. no skin dimpling.   Cardiovascular Cardiovascular examination reveals  - normal heart sounds, regular rate and rhythm with no murmurs and normal pedal pulses bilaterally.  Abdomen Inspection Inspection of the abdomen reveals - No Hernias. Palpation/Percussion Palpation and Percussion of the abdomen reveal - Soft, Non Tender, No Rebound tenderness, No Rigidity (guarding) and No hepatosplenomegaly. Auscultation Auscultation of the abdomen reveals - Bowel sounds normal.  Neurologic Neurologic evaluation reveals  - alert and oriented x 3 with no impairment of recent or remote memory. Mental Status - Normal.  Musculoskeletal Global Assessment  - Note:  no gross deformities.  Normal Exam - Left - Upper Extremity Strength Normal and Lower Extremity Strength Normal. Normal Exam - Right - Upper Extremity Strength Normal and Lower Extremity Strength Normal.  Lymphatic Head & Neck  General Head & Neck Lymphatics: Bilateral - Description - Normal. Axillary  General Axillary Region: Bilateral - Description - Normal. Tenderness - Non Tender. Femoral & Inguinal  Generalized Femoral & Inguinal Lymphatics: Bilateral - Description - No Generalized lymphadenopathy.    Assessment & Plan [name] MD; 02/25/2021 4:07 PM) MALIGNANT NEOPLASM OF LOWER-INNER QUADRANT OF LEFT BREAST IN FEMALE, ESTROGEN RECEPTOR POSITIVE (C50.312) Impression: Pt has a new diagnosis of left breast  cancer, cTis.  She is not a COMET trial candidate due to high grade nature of tumor. Prior to surgery we will get an MRI given her breast density. As long as no surprises are seen, she is a good candidate for lumpectomy. this would need to be  seed bracketed in order to make sure we get all the calcifications.  We will also get genetic testing prior to surgery.  Surgery would be followed by radiation and aromatase inhibitor.  The surgical procedure was described to the patient. I discussed the incision type and location and that we would need radiology involved on with a wire or seed marker and/or sentinel node.  The risks and benefits of the procedure were described to the patient and she wishes to proceed.  We discussed the risks bleeding, infection, damage to other structures, need for further procedures/surgeries. We discussed the risk of seroma. The patient was advised if the area in the breast in cancer, we may need to go back to surgery for additional tissue to obtain negative margins or for a lymph node biopsy. The patient was advised that these are the most common complications, but that others can occur as well. They were advised against taking aspirin or other anti-inflammatory agents/blood thinners the week before surgery. FAMILY HISTORY OF CANCER (Z80.9) Impression: Genetic testing today.  Addendum Note [name] MD; 02/25/2021 4:13 PM) Diagnosis should read breast cancer of lower inner quadrant of RIGHT breast estrogen receptor positive.     Signed electronically by [name], MD (02/25/2021 4:08 PM)

## 2021-03-11 ENCOUNTER — Other Ambulatory Visit: Payer: Self-pay | Admitting: General Surgery

## 2021-03-11 ENCOUNTER — Other Ambulatory Visit: Payer: Self-pay | Admitting: *Deleted

## 2021-03-11 ENCOUNTER — Telehealth: Payer: Self-pay | Admitting: *Deleted

## 2021-03-11 DIAGNOSIS — R928 Other abnormal and inconclusive findings on diagnostic imaging of breast: Secondary | ICD-10-CM

## 2021-03-11 DIAGNOSIS — D0511 Intraductal carcinoma in situ of right breast: Secondary | ICD-10-CM

## 2021-03-11 NOTE — Telephone Encounter (Signed)
RN received call from pt spouse Gwyndolyn Saxon stating lumpectomy surgery has been canceled for 03/13/21 and is unsure why.  States pt recently underwent breast MRI which showed concerning areas but has not received a call to explain the results and the treatment plan.  RN will forward to MD and nurse navigator for further recommendations.

## 2021-03-12 ENCOUNTER — Ambulatory Visit
Admission: RE | Admit: 2021-03-12 | Discharge: 2021-03-12 | Disposition: A | Discharge status: Home / Self Care | Payer: BC Managed Care – PPO | Source: Ambulatory Visit | Attending: General Surgery | Admitting: General Surgery

## 2021-03-12 ENCOUNTER — Other Ambulatory Visit: Payer: Self-pay

## 2021-03-12 DIAGNOSIS — D0511 Intraductal carcinoma in situ of right breast: Secondary | ICD-10-CM

## 2021-03-12 DIAGNOSIS — R928 Other abnormal and inconclusive findings on diagnostic imaging of breast: Secondary | ICD-10-CM

## 2021-03-12 MED ORDER — GADOBUTROL 1 MMOL/ML IV SOLN
6.0000 mL | Freq: Once | INTRAVENOUS | Status: AC | PRN
  Administered 2021-03-12: 6 mL via INTRAVENOUS

## 2021-03-13 ENCOUNTER — Encounter (HOSPITAL_COMMUNITY): Admission: RE | Payer: Self-pay | Source: Home / Self Care

## 2021-03-13 ENCOUNTER — Ambulatory Visit: Payer: BC Managed Care – PPO | Admitting: Family Medicine

## 2021-03-13 ENCOUNTER — Ambulatory Visit (HOSPITAL_COMMUNITY): Admission: RE | Admit: 2021-03-13 | Payer: BC Managed Care – PPO | Source: Home / Self Care | Admitting: General Surgery

## 2021-03-13 SURGERY — BREAST LUMPECTOMY WITH RADIOACTIVE SEED LOCALIZATION
Anesthesia: General | Site: Breast | Laterality: Right

## 2021-03-17 ENCOUNTER — Ambulatory Visit
Admission: RE | Admit: 2021-03-17 | Discharge: 2021-03-17 | Disposition: A | Discharge status: Home / Self Care | Payer: BC Managed Care – PPO | Source: Ambulatory Visit | Attending: General Surgery | Admitting: General Surgery

## 2021-03-17 ENCOUNTER — Other Ambulatory Visit: Payer: Self-pay | Admitting: General Surgery

## 2021-03-17 ENCOUNTER — Other Ambulatory Visit: Payer: Self-pay

## 2021-03-17 DIAGNOSIS — R928 Other abnormal and inconclusive findings on diagnostic imaging of breast: Secondary | ICD-10-CM

## 2021-03-17 DIAGNOSIS — D0511 Intraductal carcinoma in situ of right breast: Secondary | ICD-10-CM

## 2021-03-18 ENCOUNTER — Telehealth: Payer: Self-pay | Admitting: Hematology and Oncology

## 2021-03-18 NOTE — Telephone Encounter (Signed)
Cancelled appt per 7/6 sch msg. Called pt, no answer. Left msg for pt to call back to r/s.

## 2021-03-19 ENCOUNTER — Encounter: Payer: Self-pay | Admitting: *Deleted

## 2021-03-23 ENCOUNTER — Ambulatory Visit: Payer: BC Managed Care – PPO | Admitting: Hematology and Oncology

## 2021-03-24 ENCOUNTER — Other Ambulatory Visit: Payer: Self-pay | Admitting: General Surgery

## 2021-03-24 ENCOUNTER — Other Ambulatory Visit: Payer: Self-pay | Admitting: *Deleted

## 2021-03-24 DIAGNOSIS — D0511 Intraductal carcinoma in situ of right breast: Secondary | ICD-10-CM

## 2021-04-01 ENCOUNTER — Other Ambulatory Visit: Payer: Self-pay | Admitting: *Deleted

## 2021-04-01 ENCOUNTER — Other Ambulatory Visit: Payer: Self-pay | Admitting: Nurse Practitioner

## 2021-04-01 ENCOUNTER — Other Ambulatory Visit: Payer: Self-pay | Admitting: Family Medicine

## 2021-04-01 MED ORDER — ANASTROZOLE 1 MG PO TABS: 1.0000 mg | ORAL_TABLET | Freq: Every day | ORAL | 6 refills | Status: DC

## 2021-04-02 ENCOUNTER — Encounter: Payer: Self-pay | Admitting: *Deleted

## 2021-04-02 ENCOUNTER — Telehealth: Payer: Self-pay | Admitting: *Deleted

## 2021-04-02 ENCOUNTER — Telehealth: Payer: Self-pay | Admitting: Radiation Oncology

## 2021-04-02 NOTE — Telephone Encounter (Signed)
Left vm for pt regarding initiation of anastrozle d/t delay in further bx d/t hematoma.Contact information provided for questions on needs.

## 2021-04-07 ENCOUNTER — Other Ambulatory Visit: Payer: Self-pay | Admitting: Nurse Practitioner

## 2021-04-07 DIAGNOSIS — E10649 Type 1 diabetes mellitus with hypoglycemia without coma: Secondary | ICD-10-CM

## 2021-04-09 ENCOUNTER — Institutional Professional Consult (permissible substitution): Payer: BC Managed Care – PPO | Admitting: Radiation Oncology

## 2021-04-09 ENCOUNTER — Ambulatory Visit: Payer: BC Managed Care – PPO | Admitting: Radiation Oncology

## 2021-04-09 ENCOUNTER — Ambulatory Visit: Payer: BC Managed Care – PPO

## 2021-04-16 ENCOUNTER — Encounter: Payer: Self-pay | Admitting: *Deleted

## 2021-04-17 ENCOUNTER — Encounter: Payer: Self-pay | Admitting: *Deleted

## 2021-04-20 ENCOUNTER — Ambulatory Visit
Admission: RE | Admit: 2021-04-20 | Discharge: 2021-04-20 | Disposition: A | Discharge status: Home / Self Care | Payer: BC Managed Care – PPO | Source: Ambulatory Visit | Attending: General Surgery | Admitting: General Surgery

## 2021-04-20 ENCOUNTER — Other Ambulatory Visit (HOSPITAL_COMMUNITY): Payer: Self-pay | Admitting: Diagnostic Radiology

## 2021-04-20 ENCOUNTER — Other Ambulatory Visit: Payer: Self-pay

## 2021-04-20 DIAGNOSIS — D0511 Intraductal carcinoma in situ of right breast: Secondary | ICD-10-CM

## 2021-04-20 DIAGNOSIS — R928 Other abnormal and inconclusive findings on diagnostic imaging of breast: Secondary | ICD-10-CM

## 2021-04-20 MED ORDER — GADOBUTROL 1 MMOL/ML IV SOLN
6.0000 mL | Freq: Once | INTRAVENOUS | Status: AC | PRN
  Administered 2021-04-20: 6 mL via INTRAVENOUS

## 2021-04-21 ENCOUNTER — Encounter: Payer: Self-pay | Admitting: *Deleted

## 2021-04-27 ENCOUNTER — Other Ambulatory Visit: Payer: Self-pay | Admitting: General Surgery

## 2021-04-27 DIAGNOSIS — C50311 Malignant neoplasm of lower-inner quadrant of right female breast: Secondary | ICD-10-CM

## 2021-04-27 DIAGNOSIS — Z170 Estrogen receptor positive status [ER+]: Secondary | ICD-10-CM

## 2021-04-27 DIAGNOSIS — R928 Other abnormal and inconclusive findings on diagnostic imaging of breast: Secondary | ICD-10-CM

## 2021-04-29 ENCOUNTER — Other Ambulatory Visit: Payer: Self-pay | Admitting: General Surgery

## 2021-04-29 DIAGNOSIS — Z170 Estrogen receptor positive status [ER+]: Secondary | ICD-10-CM

## 2021-04-29 DIAGNOSIS — C50311 Malignant neoplasm of lower-inner quadrant of right female breast: Secondary | ICD-10-CM

## 2021-04-30 ENCOUNTER — Encounter: Payer: Self-pay | Admitting: *Deleted

## 2021-04-30 ENCOUNTER — Telehealth: Payer: Self-pay | Admitting: *Deleted

## 2021-04-30 ENCOUNTER — Telehealth: Payer: Self-pay | Admitting: Hematology and Oncology

## 2021-04-30 ENCOUNTER — Telehealth: Payer: Self-pay | Admitting: Radiation Oncology

## 2021-04-30 DIAGNOSIS — D0511 Intraductal carcinoma in situ of right breast: Secondary | ICD-10-CM

## 2021-04-30 NOTE — Telephone Encounter (Signed)
Per Dr. Barry Dienes sx scheduler pt offered earlier surgery date. Pt declined as it did not work for her.

## 2021-04-30 NOTE — Telephone Encounter (Signed)
Called patient to schedule appointment with Shona Simpson, PA with CT SIM to follow. No answer, LVM for return call.

## 2021-04-30 NOTE — Telephone Encounter (Signed)
Scheduled appt per 8/18 sch msg. Called pt, no answer. Left msg with appt date and time.  

## 2021-05-01 ENCOUNTER — Other Ambulatory Visit: Payer: Self-pay | Admitting: Nurse Practitioner

## 2021-05-01 ENCOUNTER — Telehealth: Payer: Self-pay | Admitting: Radiation Oncology

## 2021-05-01 NOTE — Telephone Encounter (Signed)
Second attempt: Called patient to schedule appointment with Shona Simpson, PA with CT SIM to follow. No answer, LVM for return call.

## 2021-05-04 ENCOUNTER — Telehealth: Payer: Self-pay | Admitting: Radiation Oncology

## 2021-05-04 NOTE — Telephone Encounter (Signed)
Third attempt: Called patient to schedule appointment with Shona Simpson, PA with CT SIM to follow. No answer, LVM for return call. Also, reached out to Mr. Enzor. No answer, LVM for return call.

## 2021-05-14 NOTE — Progress Notes (Signed)
Surgical Instructions    Your procedure is scheduled on Thursday September 15th.  Report to [facility] Main Entrance "A" at 7:30 A.M., then check in with the Admitting office.  Call this number if you have problems the morning of surgery:  [phone]   If you have any questions prior to your surgery date call [phone]: Open Monday-Friday 8am-4pm    Remember:  Do not eat after midnight the night before your surgery   Enhanced Recovery after Surgery Enhanced Recovery after Surgery is a protocol used to improve the stress on your body and your recovery after surgery.  Patient Instructions  The day of surgery (if you have diabetes):  Drink ONE small 10 oz bottle of water by __6:30___ am the morning of surgery This bottle was given to you during your hospital  pre-op appointment visit.  Nothing else to drink after completing the  Small 10 oz bottle of water.         If you have questions, please contact your surgeon's office.  You may drink clear liquids until 6:30 the morning of your surgery.   Clear liquids allowed are: Water, Non-Citrus Juices (without pulp), Carbonated Beverages, Clear Tea, Black Coffee ONLY (NO MILK, CREAM OR POWDERED CREAMER of any kind), and Gatorade    Take these medicines the morning of surgery with A SIP OF WATER levothyroxine (SYNTHROID) 88 MCG tablet  IF NEEDED albuterol (VENTOLIN HFA) 108 (90 Base) MCG/ACT inhaler - please bring with you to the hospital Budesonide (PULMICORT FLEXHALER) 90 MCG/ACT inhaler - please bring with you to the hospital LORazepam (ATIVAN) 0.5 MG tablet  WHAT DO I DO ABOUT MY DIABETES MEDICATION?   Do not take oral diabetes medicines (pills) the morning of surgery.  THE NIGHT BEFORE SURGERY, take _80% of the [place] dose__(22)__ units.     THE MORNING OF SURGERY,  take _80% of the [place] dose__(22)__ units.  The day of surgery, do not take other diabetes injectables, including Byetta (exenatide), Bydureon  (exenatide ER), Victoza (liraglutide), or Trulicity (dulaglutide).   HOW TO MANAGE YOUR DIABETES BEFORE AND AFTER SURGERY  Why is it important to control my blood sugar before and after surgery? Improving blood sugar levels before and after surgery helps healing and can limit problems. A way of improving blood sugar control is eating a healthy diet by:  Eating less sugar and carbohydrates  Increasing activity/exercise  Talking with your doctor about reaching your blood sugar goals High blood sugars (greater than 180 mg/dL) can raise your risk of infections and slow your recovery, so you will need to focus on controlling your diabetes during the weeks before surgery. Make sure that the doctor who takes care of your diabetes knows about your planned surgery including the date and location.  How do I manage my blood sugar before surgery? Check your blood sugar at least 4 times a day, starting 2 days before surgery, to make sure that the level is not too high or low.  Check your blood sugar the morning of your surgery when you wake up and every 2 hours until you get to the Short Stay unit.  If your blood sugar is less than 70 mg/dL, you will need to treat for low blood sugar: Do not take insulin. Treat a low blood sugar (less than 70 mg/dL) with  cup of clear juice (cranberry or apple), 4 glucose tablets, OR glucose gel. Recheck blood sugar in 15 minutes after treatment (to make sure it is greater than 70  mg/dL). If your blood sugar is not greater than 70 mg/dL on recheck, call [phone] for further instructions. Report your blood sugar to the short stay nurse when you get to Short Stay.  If you are admitted to the hospital after surgery: Your blood sugar will be checked by the staff and you will probably be given insulin after surgery (instead of oral diabetes medicines) to make sure you have good blood sugar levels. The goal for blood sugar control after surgery is 80-180  mg/dL.    As of today, STOP taking any Aspirin (unless otherwise instructed by your surgeon) Aleve, Naproxen, Ibuprofen, Motrin, Advil, Goody's, BC's, all herbal medications, fish oil, and all vitamins.          Do not wear jewelry or makeup Do not wear lotions, powders, perfumes, or deodorant. Do not shave 48 hours prior to surgery.   Do not bring valuables to the hospital. DO Not wear nail polish, gel polish, artificial nails, or any other type of covering on  natural nails including finger and toenails. If patients have artificial nails, gel coating, etc. that need to be removed by a nail salon please have this removed prior to surgery or surgery may need to be canceled/delayed if the surgeon/ anesthesia feels like the patient is unable to be adequately monitored.             [facility] is not responsible for any belongings or valuables.  Do NOT Smoke (Tobacco/Vaping)  24 hours prior to your procedure If you use a CPAP at night, you may bring all equipment for your overnight stay.   Contacts, glasses, dentures or bridgework may not be worn into surgery, please bring cases for these belongings   For patients admitted to the hospital, discharge time will be determined by your treatment team.   Patients discharged the day of surgery will not be allowed to drive home, and someone needs to stay with them for 24 hours.  ONLY 1 SUPPORT PERSON MAY BE PRESENT WHILE YOU ARE IN SURGERY. IF YOU ARE TO BE ADMITTED ONCE YOU ARE IN YOUR ROOM YOU WILL BE ALLOWED TWO (2) VISITORS.  Minor children may have two parents present. Special consideration for safety and communication needs will be reviewed on a case by case basis.  Special instructions:    Oral Hygiene is also important to reduce your risk of infection.  Remember - BRUSH YOUR TEETH THE MORNING OF SURGERY WITH YOUR REGULAR TOOTHPASTE   [facility]- Preparing For Surgery  Before surgery, you can play an important role. Because skin is not  sterile, your skin needs to be as free of germs as possible. You can reduce the number of germs on your skin by washing with CHG (chlorahexidine gluconate) Soap before surgery.  CHG is an antiseptic cleaner which kills germs and bonds with the skin to continue killing germs even after washing.     Please do not use if you have an allergy to CHG or antibacterial soaps. If your skin becomes reddened/irritated stop using the CHG.  Do not shave (including legs and underarms) for at least 48 hours prior to first CHG shower. It is OK to shave your face.  Please follow these instructions carefully.     Shower the NIGHT BEFORE SURGERY and the MORNING OF SURGERY with CHG Soap.   If you chose to wash your hair, wash your hair first as usual with your normal shampoo. After you shampoo, rinse your hair and body thoroughly  to remove the shampoo.  Then [employer] and genitals (private parts) with your normal soap and rinse thoroughly to remove soap.  After that Use CHG Soap as you would any other liquid soap. You can apply CHG directly to the skin and wash gently with a scrungie or a clean washcloth.   Apply the CHG Soap to your body ONLY FROM THE NECK DOWN.  Do not use on open wounds or open sores. Avoid contact with your eyes, ears, mouth and genitals (private parts). Wash Face and genitals (private parts)  with your normal soap.   Wash thoroughly, paying special attention to the area where your surgery will be performed.  Thoroughly rinse your body with warm water from the neck down.  DO NOT shower/wash with your normal soap after using and rinsing off the CHG Soap.  Pat yourself dry with a CLEAN TOWEL.  Wear CLEAN PAJAMAS to bed the night before surgery  Place CLEAN SHEETS on your bed the night before your surgery  DO NOT SLEEP WITH PETS.   Day of Surgery:  Take a shower with CHG soap. Wear Clean/Comfortable clothing the morning of surgery Do not apply any deodorants/lotions.   Remember to  brush your teeth WITH YOUR REGULAR TOOTHPASTE.   Please read over the following fact sheets that you were given.

## 2021-05-15 ENCOUNTER — Encounter (HOSPITAL_COMMUNITY): Payer: Self-pay

## 2021-05-15 ENCOUNTER — Other Ambulatory Visit: Payer: Self-pay

## 2021-05-15 ENCOUNTER — Encounter (HOSPITAL_COMMUNITY)
Admission: RE | Admit: 2021-05-15 | Discharge: 2021-05-15 | Disposition: A | Discharge status: Home / Self Care | Payer: BC Managed Care – PPO | Source: Ambulatory Visit | Attending: General Surgery | Admitting: General Surgery

## 2021-05-15 DIAGNOSIS — Z01812 Encounter for preprocedural laboratory examination: Secondary | ICD-10-CM | POA: Diagnosis present

## 2021-05-15 LAB — BASIC METABOLIC PANEL
Anion gap: 8 (ref 5–15)
BUN: 13 mg/dL (ref 8–23)
CO2: 29 mmol/L (ref 22–32)
Calcium: 9.7 mg/dL (ref 8.9–10.3)
Chloride: 103 mmol/L (ref 98–111)
Creatinine, Ser: 0.81 mg/dL (ref 0.44–1.00)
GFR, Estimated: >60 mL/min (ref >60)
Glucose, Bld: 98 mg/dL (ref 70–99)
Potassium: 4.5 mmol/L (ref 3.5–5.1)
Sodium: 140 mmol/L (ref 135–145)

## 2021-05-15 LAB — CBC
HCT: 42.5 % (ref 36.0–46.0)
Hemoglobin: 14.2 g/dL (ref 12.0–15.0)
MCH: 28.5 pg (ref 26.0–34.0)
MCHC: 33.4 g/dL (ref 30.0–36.0)
MCV: 85.2 fL (ref 80.0–100.0)
Platelets: 277 10*3/uL (ref 150–400)
RBC: 4.99 MIL/uL (ref 3.87–5.11)
RDW: 11.9 % (ref 11.5–15.5)
WBC: 6.3 10*3/uL (ref 4.0–10.5)
nRBC: 0 % (ref 0.0–0.2)

## 2021-05-15 LAB — HEMOGLOBIN A1C
Hgb A1c MFr Bld: 6.9 % — ABNORMAL HIGH (ref 4.8–5.6)
Mean Plasma Glucose: 151.33 mg/dL

## 2021-05-15 LAB — GLUCOSE, CAPILLARY: Glucose-Capillary: 110 mg/dL — ABNORMAL HIGH (ref 70–99)

## 2021-05-15 NOTE — Progress Notes (Signed)
PCP - Cox, Erasmo Downer. MD Cardiologist - denies  PPM/ICD - denies Device Orders - N/A Rep Notified - N/A  Chest x-ray - N/A EKG - 03/06/2021 Stress Test - denies ECHO - denies Cardiac Cath - denies  Sleep Study - denies CPAP - N/A  Fasting Blood Sugar - 60-100 Checks Blood Sugar 3 times a day CBG today - 110 A1C - done today in PAT  Blood Thinner Instructions: N/A  Aspirin Instructions: Patient was instructed: As of today, STOP taking any Aspirin (unless otherwise instructed by your surgeon) Aleve, Naproxen, Ibuprofen, Motrin, Advil, Goody's, BC's, all herbal medications, fish oil, and all vitamins.  ERAS Protcol - yes PRE-SURGERY - water  COVID TEST- no - ambulatory surgery   Anesthesia review: yes  Patient denies shortness of breath, fever, cough and chest pain at PAT appointment   All instructions explained to the patient, with a verbal understanding of the material. Patient agrees to go over the instructions while at home for a better understanding. Patient also instructed to self quarantine after being tested for COVID-19. The opportunity to ask questions was provided.

## 2021-05-27 ENCOUNTER — Ambulatory Visit
Admission: RE | Admit: 2021-05-27 | Discharge: 2021-05-27 | Disposition: A | Discharge status: Home / Self Care | Payer: BC Managed Care – PPO | Source: Ambulatory Visit | Attending: General Surgery | Admitting: General Surgery

## 2021-05-27 ENCOUNTER — Other Ambulatory Visit: Payer: Self-pay

## 2021-05-28 ENCOUNTER — Ambulatory Visit (HOSPITAL_COMMUNITY)
Admission: RE | Admit: 2021-05-28 | Discharge: 2021-05-28 | Disposition: A | Discharge status: Home / Self Care | Payer: BC Managed Care – PPO | Attending: General Surgery | Admitting: General Surgery

## 2021-05-28 ENCOUNTER — Encounter (HOSPITAL_COMMUNITY): Payer: Self-pay | Admitting: General Surgery

## 2021-05-28 ENCOUNTER — Other Ambulatory Visit: Payer: Self-pay

## 2021-05-28 ENCOUNTER — Ambulatory Visit (HOSPITAL_COMMUNITY): Payer: BC Managed Care – PPO | Admitting: Anesthesiology

## 2021-05-28 ENCOUNTER — Ambulatory Visit
Admission: RE | Admit: 2021-05-28 | Discharge: 2021-05-28 | Disposition: A | Discharge status: Home / Self Care | Payer: BC Managed Care – PPO | Source: Ambulatory Visit | Attending: General Surgery | Admitting: General Surgery

## 2021-05-28 ENCOUNTER — Ambulatory Visit (HOSPITAL_COMMUNITY): Payer: BC Managed Care – PPO | Admitting: Vascular Surgery

## 2021-05-28 ENCOUNTER — Encounter (HOSPITAL_COMMUNITY)
Admission: RE | Disposition: A | Discharge status: Home / Self Care | Payer: Self-pay | Source: Home / Self Care | Attending: General Surgery

## 2021-05-28 ENCOUNTER — Other Ambulatory Visit: Payer: Self-pay | Admitting: Family Medicine

## 2021-05-28 DIAGNOSIS — Z794 Long term (current) use of insulin: Secondary | ICD-10-CM | POA: Insufficient documentation

## 2021-05-28 DIAGNOSIS — Z87891 Personal history of nicotine dependence: Secondary | ICD-10-CM | POA: Insufficient documentation

## 2021-05-28 DIAGNOSIS — Z170 Estrogen receptor positive status [ER+]: Secondary | ICD-10-CM | POA: Insufficient documentation

## 2021-05-28 DIAGNOSIS — Z888 Allergy status to other drugs, medicaments and biological substances status: Secondary | ICD-10-CM | POA: Insufficient documentation

## 2021-05-28 DIAGNOSIS — N6489 Other specified disorders of breast: Secondary | ICD-10-CM | POA: Insufficient documentation

## 2021-05-28 DIAGNOSIS — C50311 Malignant neoplasm of lower-inner quadrant of right female breast: Secondary | ICD-10-CM

## 2021-05-28 DIAGNOSIS — Z800 Family history of malignant neoplasm of digestive organs: Secondary | ICD-10-CM | POA: Diagnosis not present

## 2021-05-28 DIAGNOSIS — D0511 Intraductal carcinoma in situ of right breast: Secondary | ICD-10-CM | POA: Diagnosis not present

## 2021-05-28 DIAGNOSIS — N6091 Unspecified benign mammary dysplasia of right breast: Secondary | ICD-10-CM | POA: Diagnosis not present

## 2021-05-28 DIAGNOSIS — Z79890 Hormone replacement therapy (postmenopausal): Secondary | ICD-10-CM | POA: Insufficient documentation

## 2021-05-28 DIAGNOSIS — Z881 Allergy status to other antibiotic agents status: Secondary | ICD-10-CM | POA: Insufficient documentation

## 2021-05-28 DIAGNOSIS — Z79899 Other long term (current) drug therapy: Secondary | ICD-10-CM | POA: Insufficient documentation

## 2021-05-28 DIAGNOSIS — Z7951 Long term (current) use of inhaled steroids: Secondary | ICD-10-CM | POA: Insufficient documentation

## 2021-05-28 DIAGNOSIS — Z801 Family history of malignant neoplasm of trachea, bronchus and lung: Secondary | ICD-10-CM | POA: Insufficient documentation

## 2021-05-28 HISTORY — PX: BREAST LUMPECTOMY WITH RADIOACTIVE SEED LOCALIZATION: SHX6424

## 2021-05-28 LAB — GLUCOSE, CAPILLARY
Glucose-Capillary: 141 mg/dL — ABNORMAL HIGH (ref 70–99)
Glucose-Capillary: 116 mg/dL — ABNORMAL HIGH (ref 70–99)

## 2021-05-28 SURGERY — BREAST LUMPECTOMY WITH RADIOACTIVE SEED LOCALIZATION
Anesthesia: General | Site: Breast | Laterality: Right

## 2021-05-28 MED ORDER — 0.9 % SODIUM CHLORIDE (POUR BTL) OPTIME
TOPICAL | Status: DC | PRN
  Administered 2021-05-28: 1000 mL

## 2021-05-28 MED ORDER — ACETAMINOPHEN 325 MG PO TABS: 325.0000 mg | ORAL_TABLET | ORAL | Status: DC | PRN

## 2021-05-28 MED ORDER — MIDAZOLAM HCL 5 MG/5ML IJ SOLN
INTRAMUSCULAR | Status: DC | PRN
Start: 2021-05-28 — End: 2021-05-28
  Administered 2021-05-28: 2 mg via INTRAVENOUS

## 2021-05-28 MED ORDER — ONDANSETRON HCL 4 MG/2ML IJ SOLN
INTRAMUSCULAR | Status: DC | PRN
  Administered 2021-05-28: 4 mg via INTRAVENOUS

## 2021-05-28 MED ORDER — KETOROLAC TROMETHAMINE 30 MG/ML IJ SOLN
INTRAMUSCULAR | Status: AC
  Filled 2021-05-28: qty 1

## 2021-05-28 MED ORDER — CHLORHEXIDINE GLUCONATE CLOTH 2 % EX PADS
6.0000 | MEDICATED_PAD | Freq: Once | CUTANEOUS | Status: DC
Start: 2021-05-28 — End: 2021-05-28

## 2021-05-28 MED ORDER — LIDOCAINE HCL 1 % IJ SOLN
INTRAMUSCULAR | Status: DC | PRN
  Administered 2021-05-28: 60 mL via INTRAMUSCULAR

## 2021-05-28 MED ORDER — DEXAMETHASONE SODIUM PHOSPHATE 10 MG/ML IJ SOLN
INTRAMUSCULAR | Status: DC | PRN
  Administered 2021-05-28: 5 mg via INTRAVENOUS

## 2021-05-28 MED ORDER — CEFAZOLIN SODIUM-DEXTROSE 2-4 GM/100ML-% IV SOLN
2.0000 g | INTRAVENOUS | Status: AC
  Administered 2021-05-28: 2 g via INTRAVENOUS
  Filled 2021-05-28: qty 100

## 2021-05-28 MED ORDER — DEXAMETHASONE SODIUM PHOSPHATE 10 MG/ML IJ SOLN
INTRAMUSCULAR | Status: AC
  Filled 2021-05-28: qty 1

## 2021-05-28 MED ORDER — LIDOCAINE 2% (20 MG/ML) 5 ML SYRINGE
INTRAMUSCULAR | Status: DC | PRN
  Administered 2021-05-28: 50 mg via INTRAVENOUS

## 2021-05-28 MED ORDER — FENTANYL CITRATE (PF) 100 MCG/2ML IJ SOLN: 25.0000 ug | INTRAMUSCULAR | Status: DC | PRN

## 2021-05-28 MED ORDER — ONDANSETRON HCL 4 MG/2ML IJ SOLN
INTRAMUSCULAR | Status: AC
  Filled 2021-05-28: qty 2

## 2021-05-28 MED ORDER — KETOROLAC TROMETHAMINE 30 MG/ML IJ SOLN
INTRAMUSCULAR | Status: DC | PRN
  Administered 2021-05-28: 30 mg via INTRAVENOUS

## 2021-05-28 MED ORDER — OXYCODONE HCL 5 MG PO TABS: 5.0000 mg | ORAL_TABLET | Freq: Four times a day (QID) | ORAL | 0 refills | Status: DC | PRN

## 2021-05-28 MED ORDER — ENSURE PRE-SURGERY PO LIQD
296.0000 mL | Freq: Once | ORAL | Status: AC
  Administered 2021-05-28: 296 mL via ORAL

## 2021-05-28 MED ORDER — FENTANYL CITRATE (PF) 250 MCG/5ML IJ SOLN
INTRAMUSCULAR | Status: AC
  Filled 2021-05-28: qty 5

## 2021-05-28 MED ORDER — OXYCODONE HCL 5 MG PO TABS: 5.0000 mg | ORAL_TABLET | Freq: Once | ORAL | Status: DC | PRN

## 2021-05-28 MED ORDER — ONDANSETRON HCL 4 MG/2ML IJ SOLN: 4.0000 mg | Freq: Once | INTRAMUSCULAR | Status: DC | PRN

## 2021-05-28 MED ORDER — BUPIVACAINE-EPINEPHRINE (PF) 0.25% -1:200000 IJ SOLN
INTRAMUSCULAR | Status: AC
  Filled 2021-05-28: qty 30

## 2021-05-28 MED ORDER — ACETAMINOPHEN 500 MG PO TABS
1000.0000 mg | ORAL_TABLET | ORAL | Status: AC
  Administered 2021-05-28: 1000 mg via ORAL
  Filled 2021-05-28: qty 2

## 2021-05-28 MED ORDER — MEPERIDINE HCL 25 MG/ML IJ SOLN: 6.2500 mg | INTRAMUSCULAR | Status: DC | PRN

## 2021-05-28 MED ORDER — OXYCODONE HCL 5 MG/5ML PO SOLN: 5.0000 mg | Freq: Once | ORAL | Status: DC | PRN

## 2021-05-28 MED ORDER — ACETAMINOPHEN 160 MG/5ML PO SOLN: 325.0000 mg | ORAL | Status: DC | PRN

## 2021-05-28 MED ORDER — CHLORHEXIDINE GLUCONATE 0.12 % MT SOLN
15.0000 mL | Freq: Once | OROMUCOSAL | Status: AC
  Administered 2021-05-28: 15 mL via OROMUCOSAL
  Filled 2021-05-28: qty 15

## 2021-05-28 MED ORDER — PHENYLEPHRINE HCL-NACL 20-0.9 MG/250ML-% IV SOLN
INTRAVENOUS | Status: DC | PRN
  Administered 2021-05-28: 30 ug/min via INTRAVENOUS

## 2021-05-28 MED ORDER — LACTATED RINGERS IV SOLN: INTRAVENOUS | Status: DC

## 2021-05-28 MED ORDER — PROPOFOL 10 MG/ML IV BOLUS
INTRAVENOUS | Status: DC | PRN
  Administered 2021-05-28: 100 mg via INTRAVENOUS

## 2021-05-28 MED ORDER — STERILE WATER FOR IRRIGATION IR SOLN
Status: DC | PRN
  Administered 2021-05-28: 1000 mL

## 2021-05-28 MED ORDER — MIDAZOLAM HCL 2 MG/2ML IJ SOLN
INTRAMUSCULAR | Status: AC
  Filled 2021-05-28: qty 2

## 2021-05-28 MED ORDER — PROPOFOL 10 MG/ML IV BOLUS
INTRAVENOUS | Status: AC
  Filled 2021-05-28: qty 20

## 2021-05-28 MED ORDER — CHLORHEXIDINE GLUCONATE CLOTH 2 % EX PADS: 6.0000 | MEDICATED_PAD | Freq: Once | CUTANEOUS | Status: DC

## 2021-05-28 MED ORDER — ORAL CARE MOUTH RINSE: 15.0000 mL | Freq: Once | OROMUCOSAL | Status: AC

## 2021-05-28 MED ORDER — LIDOCAINE HCL (PF) 1 % IJ SOLN
INTRAMUSCULAR | Status: AC
  Filled 2021-05-28: qty 30

## 2021-05-28 MED ORDER — FENTANYL CITRATE (PF) 100 MCG/2ML IJ SOLN
INTRAMUSCULAR | Status: DC | PRN
  Administered 2021-05-28: 25 ug via INTRAVENOUS
  Administered 2021-05-28: 50 ug via INTRAVENOUS
  Administered 2021-05-28: 25 ug via INTRAVENOUS

## 2021-05-28 SURGICAL SUPPLY — 57 items
ADH SKN CLS APL DERMABOND .7 (GAUZE/BANDAGES/DRESSINGS) ×1
APL PRP STRL LF DISP 70% ISPRP (MISCELLANEOUS) ×1
BAG COUNTER SPONGE SURGICOUNT (BAG) ×2 IMPLANT
BAG SPNG CNTER NS LX DISP (BAG) ×1
BINDER BREAST XLRG (GAUZE/BANDAGES/DRESSINGS) ×1 IMPLANT
BLADE SURG 10 STRL SS (BLADE) ×2 IMPLANT
BNDG COHESIVE 4X5 TAN STRL (GAUZE/BANDAGES/DRESSINGS) ×2 IMPLANT
CANISTER SUCT 3000ML PPV (MISCELLANEOUS) ×2 IMPLANT
CHLORAPREP W/TINT 26 (MISCELLANEOUS) ×2 IMPLANT
CLIP VESOCCLUDE LG 6/CT (CLIP) ×2 IMPLANT
CLIP VESOCCLUDE MED 24/CT (CLIP) ×2 IMPLANT
CNTNR URN SCR LID CUP LEK RST (MISCELLANEOUS) IMPLANT
CONT SPEC 4OZ STRL OR WHT (MISCELLANEOUS)
COVER PROBE W GEL 5X96 (DRAPES) ×2 IMPLANT
COVER SURGICAL LIGHT HANDLE (MISCELLANEOUS) ×2 IMPLANT
DERMABOND ADVANCED (GAUZE/BANDAGES/DRESSINGS) ×1
DERMABOND ADVANCED .7 DNX12 (GAUZE/BANDAGES/DRESSINGS) ×1 IMPLANT
DEVICE DUBIN SPECIMEN MAMMOGRA (MISCELLANEOUS) ×2 IMPLANT
DRAPE CHEST BREAST 15X10 FENES (DRAPES) ×2 IMPLANT
DRAPE SURG 17X23 STRL (DRAPES) IMPLANT
DRSG PAD ABDOMINAL 8X10 ST (GAUZE/BANDAGES/DRESSINGS) ×2 IMPLANT
ELECT COATED BLADE 2.86 ST (ELECTRODE) ×2 IMPLANT
ELECT NDL BLADE 2-5/6 (NEEDLE) ×1 IMPLANT
ELECT NEEDLE BLADE 2-5/6 (NEEDLE) ×2 IMPLANT
ELECT REM PT RETURN 9FT ADLT (ELECTROSURGICAL) ×2
ELECTRODE REM PT RTRN 9FT ADLT (ELECTROSURGICAL) ×1 IMPLANT
GAUZE SPONGE 4X4 12PLY STRL (GAUZE/BANDAGES/DRESSINGS) ×1 IMPLANT
GAUZE SPONGE 4X4 12PLY STRL LF (GAUZE/BANDAGES/DRESSINGS) ×2 IMPLANT
GLOVE SURG ENC MOIS LTX SZ6 (GLOVE) ×2 IMPLANT
GLOVE SURG UNDER LTX SZ6.5 (GLOVE) ×2 IMPLANT
GOWN STRL REUS W/ TWL LRG LVL3 (GOWN DISPOSABLE) ×1 IMPLANT
GOWN STRL REUS W/TWL 2XL LVL3 (GOWN DISPOSABLE) ×2 IMPLANT
GOWN STRL REUS W/TWL LRG LVL3 (GOWN DISPOSABLE) ×2
KIT BASIN OR (CUSTOM PROCEDURE TRAY) ×2 IMPLANT
KIT MARKER MARGIN INK (KITS) ×2 IMPLANT
NDL 18GX1X1/2 (RX/OR ONLY) (NEEDLE) IMPLANT
NDL FILTER BLUNT 18X1 1/2 (NEEDLE) IMPLANT
NDL HYPO 25GX1X1/2 BEV (NEEDLE) ×1 IMPLANT
NEEDLE 18GX1X1/2 (RX/OR ONLY) (NEEDLE) IMPLANT
NEEDLE FILTER BLUNT 18X 1/2SAF (NEEDLE)
NEEDLE FILTER BLUNT 18X1 1/2 (NEEDLE) IMPLANT
NEEDLE HYPO 25GX1X1/2 BEV (NEEDLE) ×2 IMPLANT
NS IRRIG 1000ML POUR BTL (IV SOLUTION) ×2 IMPLANT
PACK GENERAL/GYN (CUSTOM PROCEDURE TRAY) ×2 IMPLANT
PACK UNIVERSAL I (CUSTOM PROCEDURE TRAY) ×2 IMPLANT
PAD ABD 8X10 STRL (GAUZE/BANDAGES/DRESSINGS) ×1 IMPLANT
STOCKINETTE IMPERVIOUS 9X36 MD (GAUZE/BANDAGES/DRESSINGS) ×2 IMPLANT
STRIP CLOSURE SKIN 1/2X4 (GAUZE/BANDAGES/DRESSINGS) ×2 IMPLANT
SUT MNCRL AB 4-0 PS2 18 (SUTURE) ×2 IMPLANT
SUT VIC AB 2-0 SH 27 (SUTURE) ×2
SUT VIC AB 2-0 SH 27XBRD (SUTURE) ×1 IMPLANT
SUT VIC AB 3-0 SH 27 (SUTURE) ×2
SUT VIC AB 3-0 SH 27X BRD (SUTURE) ×1 IMPLANT
SUT VIC AB 3-0 SH 8-18 (SUTURE) ×2 IMPLANT
SYR CONTROL 10ML LL (SYRINGE) ×2 IMPLANT
TOWEL GREEN STERILE (TOWEL DISPOSABLE) ×2 IMPLANT
TOWEL GREEN STERILE FF (TOWEL DISPOSABLE) ×2 IMPLANT

## 2021-05-28 NOTE — Telephone Encounter (Signed)
Refill sent to pharmacy.   

## 2021-05-28 NOTE — Transfer of Care (Signed)
Immediate Anesthesia Transfer of Care Note  Patient: Nicole Marquez  Procedure(s) Performed: RIGHT BREAST LUMPECTOMY WITH RADIOACTIVE SEED LOCALIZATION X2 (Right: Breast)  Patient Location: PACU  Anesthesia Type:General  Level of Consciousness: awake, alert , oriented and patient cooperative  Airway & Oxygen Therapy: Patient Spontanous Breathing  Post-op Assessment: Report given to RN and Post -op Vital signs reviewed and stable  Post vital signs: Reviewed and stable  Last Vitals:  Vitals Value Taken Time  BP 124/96 05/28/21 1111  Temp    Pulse 78 05/28/21 1112  Resp 16 05/28/21 1112  SpO2 100 % 05/28/21 1112  Vitals shown include unvalidated device data.  Last Pain:  Vitals:   05/28/21 0818  TempSrc:   PainSc: 0-No pain      Patients Stated Pain Goal: 4 ([id number])  Complications: No notable events documented.

## 2021-05-28 NOTE — Anesthesia Preprocedure Evaluation (Addendum)
Anesthesia Evaluation  Patient identified by MRN, date of birth, ID band Patient awake    Reviewed: Allergy & Precautions, H&P , NPO status , Patient's Chart, lab work & pertinent test results, reviewed documented beta blocker date and time   Airway Mallampati: II  TM Distance: >3 FB Neck ROM: full    Dental no notable dental hx. (+) Teeth Intact, Poor Dentition, Chipped, Dental Advisory Given   Pulmonary neg pulmonary ROS, former smoker,    Pulmonary exam normal breath sounds clear to auscultation       Cardiovascular Exercise Tolerance: Good negative cardio ROS   Rhythm:regular Rate:Normal     Neuro/Psych PSYCHIATRIC DISORDERS Anxiety negative neurological ROS     GI/Hepatic negative GI ROS, Neg liver ROS,   Endo/Other  diabetes, Type 1Hypothyroidism   Renal/GU negative Renal ROS  negative genitourinary   Musculoskeletal   Abdominal   Peds  Hematology negative hematology ROS (+)   Anesthesia Other Findings   Reproductive/Obstetrics negative OB ROS                            Anesthesia Physical Anesthesia Plan  ASA: 3  Anesthesia Plan: General   Post-op Pain Management:    Induction: Intravenous  PONV Risk Score and Plan: 3 and Ondansetron, Treatment may vary due to age or medical condition and Midazolam  Airway Management Planned: LMA  Additional Equipment: None  Intra-op Plan:   Post-operative Plan:   Informed Consent: I have reviewed the patients History and Physical, chart, labs and discussed the procedure including the risks, benefits and alternatives for the proposed anesthesia with the patient or authorized representative who has indicated his/her understanding and acceptance.     Dental Advisory Given  Plan Discussed with: CRNA and Anesthesiologist  Anesthesia Plan Comments: ( )        Anesthesia Quick Evaluation

## 2021-05-28 NOTE — Op Note (Signed)
Right Breast Radioactive seed bracketed lumpectomy  Indications: This patient presents with history of right breast cancer, cTis, high grade, LIQ quadrant, +/+ receptors  Pre-operative Diagnosis: DCIS  Post-operative Diagnosis: DCIS  Surgeon: [name]   Assistant:  Drucie Ip, MD  Anesthesia: General endotracheal anesthesia  ASA Class: 3  Procedure Details  The patient was seen in the Holding Room. The risks, benefits, complications, treatment options, and expected outcomes were discussed with the patient. The possibilities of bleeding, infection, the need for additional procedures, failure to diagnose a condition, and creating a complication requiring other procedures or operations were discussed with the patient. The patient concurred with the proposed plan, giving informed consent.  The site of surgery properly noted/marked. The patient was taken to Operating Room # 1, identified, and the procedure verified as left breast seed localized lumpectomy.  The left breast and chest were prepped and draped in standard fashion. A transverse incision was made near the previously placed radioactive seeds.  Dissection was carried down around the point of maximum signal intensity. The cautery was used to perform the dissection.   The specimen was inked with the margin marker paint kit.    Specimen radiography confirmed inclusion of the mammographic lesion, the clip, and the seed.  The background signal in the breast was zero.  Hemostasis was achieved with cautery.  The cavity was marked with clips on each border other than the anterior border.  Mastopexy was performed by freeing up the breast tissue on the superior aspect to help close the defect.  This was secured down with a 2-0 vicryl.  The wound was irrigated and closed with 3-0 vicryl interrupted deep dermal sutures and 4-0 monocryl running subcuticular suture.      Sterile dressings were applied. At the end of the operation, all sponge,  instrument, and needle counts were correct.   Findings: Seed, clip in specimen.  Anterior, medial, and inferior margins are skin.  Posterior margin is pectoralis.    Estimated Blood Loss:  min         Specimens: left breast tissue with seeds         Complications:  None; patient tolerated the procedure well.         Disposition: PACU - hemodynamically stable.         Condition: stable

## 2021-05-28 NOTE — Anesthesia Procedure Notes (Signed)
Procedure Name: LMA Insertion Date/Time: 05/28/2021 9:41 AM Performed by: Gwyndolyn Saxon, CRNA Pre-anesthesia Checklist: Patient identified, Emergency Drugs available, Suction available and Patient being monitored Patient Re-evaluated:Patient Re-evaluated prior to induction Oxygen Delivery Method: Circle system utilized Preoxygenation: Pre-oxygenation with 100% oxygen Induction Type: IV induction Ventilation: Mask ventilation without difficulty LMA: LMA inserted LMA Size: 4.0 Number of attempts: 1 Placement Confirmation: positive ETCO2 and breath sounds checked- equal and bilateral Tube secured with: Tape Dental Injury: Teeth and Oropharynx as per pre-operative assessment

## 2021-05-28 NOTE — Discharge Instructions (Addendum)
[facility] Office Phone Number [phone]  BREAST BIOPSY/ PARTIAL MASTECTOMY: POST OP INSTRUCTIONS  Always review your discharge instruction sheet given to you by the facility where your surgery was performed.  IF YOU HAVE DISABILITY OR FAMILY LEAVE FORMS, YOU MUST BRING THEM TO THE OFFICE FOR PROCESSING.  DO NOT GIVE THEM TO YOUR DOCTOR.  A prescription for pain medication may be given to you upon discharge.  Take your pain medication as prescribed, if needed.  If narcotic pain medicine is not needed, then you may take acetaminophen (Tylenol) or ibuprofen (Advil) as needed. Take your usually prescribed medications unless otherwise directed If you need a refill on your pain medication, please contact your pharmacy.  They will contact our office to request authorization.  Prescriptions will not be filled after 5pm or on week-ends. You should eat very light the first 24 hours after surgery, such as soup, crackers, pudding, etc.  Resume your normal diet the day after surgery. Most patients will experience some swelling and bruising in the breast.  Ice packs and a good support bra will help.  Swelling and bruising can take several days to resolve.  It is common to experience some constipation if taking pain medication after surgery.  Increasing fluid intake and taking a stool softener will usually help or prevent this problem from occurring.  A mild laxative (Milk of Magnesia or Miralax) should be taken according to package directions if there are no bowel movements after 48 hours. Unless discharge instructions indicate otherwise, you may remove your bandages 48 hours after surgery, and you may shower at that time.  You may have steri-strips (small skin tapes) in place directly over the incision.  These strips should be left on the skin for 7-10 days.   Any sutures or staples will be removed at the office during your follow-up visit. ACTIVITIES:  You may resume regular daily activities  (gradually increasing) beginning the next day.  Wearing a good support bra or sports bra (or the breast binder) minimizes pain and swelling.  You may have sexual intercourse when it is comfortable. You may drive when you no longer are taking prescription pain medication, you can comfortably wear a seatbelt, and you can safely maneuver your car and apply brakes. RETURN TO WORK:  __________1 week_______________ You should see your doctor in the office for a follow-up appointment approximately two weeks after your surgery.  Your doctor's nurse will typically make your follow-up appointment when she calls you with your pathology report.  Expect your pathology report 2-3 business days after your surgery.  You may call to check if you do not hear from us after three days.   WHEN TO CALL YOUR DOCTOR: Fever over 101.0 Nausea and/or vomiting. Extreme swelling or bruising. Continued bleeding from incision. Increased pain, redness, or drainage from the incision.  The clinic staff is available to answer your questions during regular business hours.  Please don't hesitate to call and ask to speak to one of the nurses for clinical concerns.  If you have a medical emergency, go to the nearest emergency room or call 911.  A surgeon from [facility] is always on call at the hospital.  For further questions, please visit [email or web address]   

## 2021-05-28 NOTE — H&P (Signed)
REFERRING PHYSICIAN: Cox  PROVIDER: Georgianne Fick, MD  Care Team: Patient Care Team: Cox, Mallie Snooks, MD as PCP - General (Family Medicine)   MRN: [id number] DOB: Jun 29, 1961  Subjective   Chief Complaint: Breast Cancer after MRI   History of Present Illness: Nicole Marquez is a 60 y.o. female who is seen today for follow up for right breast cancer.  Pt is a 60 yo F who was referred for consultation by Dr. Purcell Nails for a new diagnosis of right breast cancer 02/2021.  the patient presented with screening detected right breast calcifications.  Diagnostic imaging confirmed this with 3 cm of calcs seen in the LIQ on the right.  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    She hadn't had cancer before this dx.  She has some family history of cancer with her mother and maternal grandfather having colon carcinoid tumors, a brother with colon cancer, and a mother aunt with lung cancer. She had menarche at age 35. Menopause was age 40.  She is a G4P4 with first child at age 45.  She hasn't used HRT or hormonal contraception.    She is a former smoker who quit 10/2020.  She doesn't have alcohol or drug use history.  her last colonoscopy was in 2010.  she hasn't ever had a bone density study.    She underwent breast MRI and had numerous findings. She has had a total of 6 breast biopsies. The left breast is clear. 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 She is here to discuss. The left breast had a biopsy that was [facility] as well.   Pathology core needle biopsy:  Original pathology 02/16/21 Breast, right, needle core biopsy, Lower inner quadrant - DUCTAL CARCINOMA IN SITU WITH CALCIFICATIONS AND NECROSIS, SEE COMMENT. Microscopic Comment The carcinoma appears high grade.  Receptors: Estrogen Receptor:  60%, POSITIVE, MODERATE STAINING INTENSITY Progesterone Receptor: 0%, NEGATIVE  Review of Systems: A complete review of systems was obtained from the patient. I have reviewed this information and discussed as appropriate with the patient. See HPI as well for other ROS.  Review of Systems  Eyes:  Visual issues; gets eye injections every 6 weeks.  Endo/Heme/Allergies: Bruises/bleeds easily.    Medical History: Past Medical History:  Diagnosis Date   Anemia   Asthma, unspecified asthma severity, unspecified whether complicated, unspecified whether persistent   Diabetes mellitus without complication (CMS-HCC)   History of cancer   Thyroid disease   Patient Active Problem List  Diagnosis   Malignant neoplasm of lower-inner quadrant of right breast of female, estrogen receptor positive (CMS-HCC)   Abnormal MRI, breast   History reviewed. No pertinent surgical history.   Allergies  Allergen Reactions   Atorvastatin Other (See Comments)  Muscle pain.   Ciprofloxacin Hives   Current Outpatient Medications on File Prior to Visit  Medication Sig Dispense Refill   insulin DEGLUDEC (TRESIBA FLEXTOUCH U-200) pen injector (concentration 200 units/mL) ADMINISTER 28 UNITS UNDER THE SKIN DAILY   levothyroxine (SYNTHROID) 88 MCG tablet Take 88 mcg by mouth once daily   PULMICORT FLEXHALER 90 mcg/actuation inhaler INHALE 1 PUFF INTO THE LUNGS TWICE A DAY   ramipriL (ALTACE) 10 MG capsule Take 10 mg by mouth once daily   No current facility-administered medications  on file prior to visit.   Family History  Problem Relation Age of Onset   Diabetes Father   Colon cancer Brother    Social History   Tobacco Use  Smoking Status Former Smoker  Smokeless Tobacco Never Used    Social History   Socioeconomic History   Marital status: Unknown  Tobacco Use   Smoking status: Former Smoker   Smokeless tobacco: Never Used  Substance and Sexual Activity   Alcohol use: Not Currently    Drug use: Never   Objective:   Vitals:  Pulse: 85  Temp: 36.8 C (98.2 F)  SpO2: 100%  Weight: 56.3 kg (124 lb 3.2 oz)  Height: 152.4 cm (5')   Body mass index is 24.26 kg/m.  Entire visit spent in counseling and coordination of care.   Labs See above.   Assessment and Plan:   Malignant neoplasm of lower-inner quadrant of right breast of female, estrogen receptor positive (CMS-HCC) I will plan seed bracketed lumpectomy on the right.   This would be followed by antihormonal tx and radiation. I do not need to do anything with the lymph nodes as this is DCIS.   The surgical procedure was described to the patient. I discussed the incision type and location and that we would need radiology involved on with a wire or seed marker and/or sentinel node.   The risks and benefits of the procedure were described to the patient and she wishes to proceed.   We discussed the risks bleeding, infection, damage to other structures, need for further procedures/surgeries. We discussed the risk of seroma. The patient was advised if the area in the breast in cancer, we may need to go back to surgery for additional tissue to obtain negative margins or for a lymph node biopsy. The patient was advised that these are the most common complications, but that others can occur as well. They were advised against taking aspirin or other anti-inflammatory agents/blood thinners the week before surgery.   Abnormal MRI, breast Pt declines excision of the discordant PASH and the [facility].    No follow-ups on file.  Milus Height, MD FACS Surgical Oncology, General Surgery, Trauma and [facility]

## 2021-05-28 NOTE — Anesthesia Postprocedure Evaluation (Signed)
Anesthesia Post Note  Patient: Nicole Marquez  Procedure(s) Performed: RIGHT BREAST LUMPECTOMY WITH RADIOACTIVE SEED LOCALIZATION X2 (Right: Breast)     Patient location during evaluation: PACU Anesthesia Type: General Level of consciousness: awake and alert Pain management: pain level controlled Vital Signs Assessment: post-procedure vital signs reviewed and stable Respiratory status: spontaneous breathing, nonlabored ventilation, respiratory function stable and patient connected to nasal cannula oxygen Cardiovascular status: blood pressure returned to baseline and stable Postop Assessment: no apparent nausea or vomiting Anesthetic complications: no   No notable events documented.  Last Vitals:  Vitals:   05/28/21 1125 05/28/21 1140  BP: 128/71 (!) 133/57  Pulse: 78 62  Resp: 17 11  Temp:  36.7 C  SpO2: 99% 99%    Last Pain:  Vitals:   05/28/21 1140  TempSrc:   PainSc: 0-No pain                 Tiffiny Worthy

## 2021-05-28 NOTE — Interval H&P Note (Signed)
History and Physical Interval Note:  05/28/2021 8:16 AM  Nicole Marquez  has presented today for surgery, with the diagnosis of RIGHT BREAST CANCER.  The various methods of treatment have been discussed with the patient and family. After consideration of risks, benefits and other options for treatment, the patient has consented to  Procedure(s): RIGHT BREAST LUMPECTOMY WITH RADIOACTIVE SEED LOCALIZATION (Right) as a surgical intervention.  The patient's history has been reviewed, patient examined, no change in status, stable for surgery.  I have reviewed the patient's chart and labs.  Questions were answered to the patient's satisfaction.     [name]

## 2021-05-29 ENCOUNTER — Encounter (HOSPITAL_COMMUNITY): Payer: Self-pay | Admitting: General Surgery

## 2021-06-02 ENCOUNTER — Other Ambulatory Visit: Payer: Self-pay | Admitting: *Deleted

## 2021-06-02 ENCOUNTER — Other Ambulatory Visit: Payer: Self-pay | Admitting: Nurse Practitioner

## 2021-06-02 DIAGNOSIS — J4521 Mild intermittent asthma with (acute) exacerbation: Secondary | ICD-10-CM

## 2021-06-02 DIAGNOSIS — D0511 Intraductal carcinoma in situ of right breast: Secondary | ICD-10-CM

## 2021-06-02 LAB — SURGICAL PATHOLOGY

## 2021-06-03 ENCOUNTER — Encounter: Payer: Self-pay | Admitting: *Deleted

## 2021-06-03 ENCOUNTER — Telehealth: Payer: Self-pay | Admitting: Radiation Oncology

## 2021-06-03 NOTE — Telephone Encounter (Signed)
Second attempt: Called patient to schedule appointment with Shona Simpson, PA with CT [facility] to follow. No answer, LVM for return call.   (Failed to document 1st attempt 06/02/21)

## 2021-06-08 ENCOUNTER — Telehealth: Payer: Self-pay | Admitting: Radiation Oncology

## 2021-06-08 ENCOUNTER — Inpatient Hospital Stay: Payer: BC Managed Care – PPO | Admitting: Hematology and Oncology

## 2021-06-08 NOTE — Telephone Encounter (Signed)
Third attempt: Called patient to schedule appointment with Shona Simpson, PA with CT [facility] to follow. No answer, LVM for return call. Also, LVM for Mr. Feltes.

## 2021-06-09 ENCOUNTER — Encounter: Payer: Self-pay | Admitting: *Deleted

## 2021-06-09 NOTE — Progress Notes (Signed)
Per Shay in radiation oncology, 3 attempts have been made to schedule pt to see Dr. Lisbeth Renshaw to discuss radiation treatment. Msg sent to providers informing of this. Verified msg sent to pt requesting a better number to reach her at as well as providing the contact information for scheduling for radiation oncology.

## 2021-06-15 ENCOUNTER — Encounter: Payer: Self-pay | Admitting: *Deleted

## 2021-06-23 ENCOUNTER — Encounter: Payer: Self-pay | Admitting: *Deleted

## 2021-06-23 ENCOUNTER — Other Ambulatory Visit: Payer: Self-pay | Admitting: Nurse Practitioner

## 2021-06-23 ENCOUNTER — Telehealth: Payer: Self-pay | Admitting: Hematology and Oncology

## 2021-06-23 ENCOUNTER — Telehealth: Payer: Self-pay | Admitting: *Deleted

## 2021-06-23 DIAGNOSIS — J4521 Mild intermittent asthma with (acute) exacerbation: Secondary | ICD-10-CM

## 2021-06-23 NOTE — Telephone Encounter (Signed)
Scheduled per sch msg. Called and left msg  

## 2021-06-23 NOTE — Telephone Encounter (Signed)
Called pt to discuss moving forward with xrt. No answer. Left vm requesting return call to further discuss if pt would like to proceed. Contact information provided.

## 2021-06-29 ENCOUNTER — Other Ambulatory Visit: Payer: Self-pay | Admitting: Family Medicine

## 2021-06-29 ENCOUNTER — Telehealth: Payer: Self-pay | Admitting: *Deleted

## 2021-06-29 DIAGNOSIS — D0511 Intraductal carcinoma in situ of right breast: Secondary | ICD-10-CM

## 2021-06-29 NOTE — Telephone Encounter (Signed)
Received vm from pt stating she has decided to forgo further treatment. Physician team notified.

## 2021-06-30 ENCOUNTER — Inpatient Hospital Stay: Payer: BC Managed Care – PPO | Attending: Hematology and Oncology | Admitting: Hematology and Oncology

## 2021-06-30 NOTE — Assessment & Plan Note (Deleted)
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

## 2021-07-03 ENCOUNTER — Encounter: Payer: Self-pay | Admitting: *Deleted

## 2021-07-05 IMAGING — MG MM BREAST LOCALIZATION CLIP
4 series · 4 of 12 positions shown · non-contrast
Comparison: Previous exam(s).

CLINICAL DATA: Status post MRI guided core needle biopsy left
breast 2 sites

EXAM:
3D DIAGNOSTIC LEFT MAMMOGRAM POST MRI BIOPSY

[L CC synth-2D]
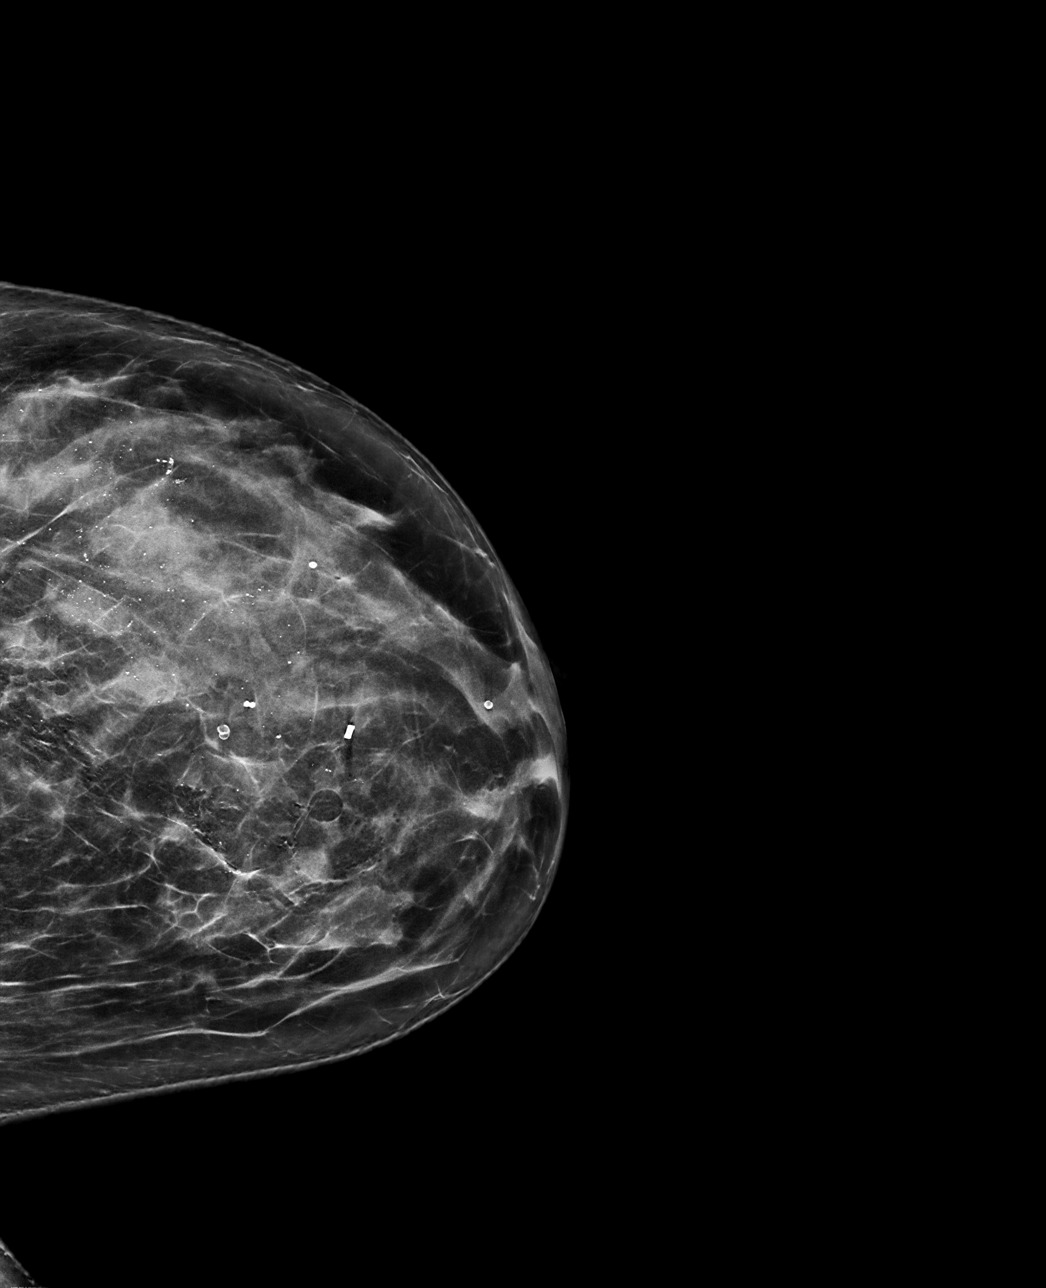

[L ML synth-2D]
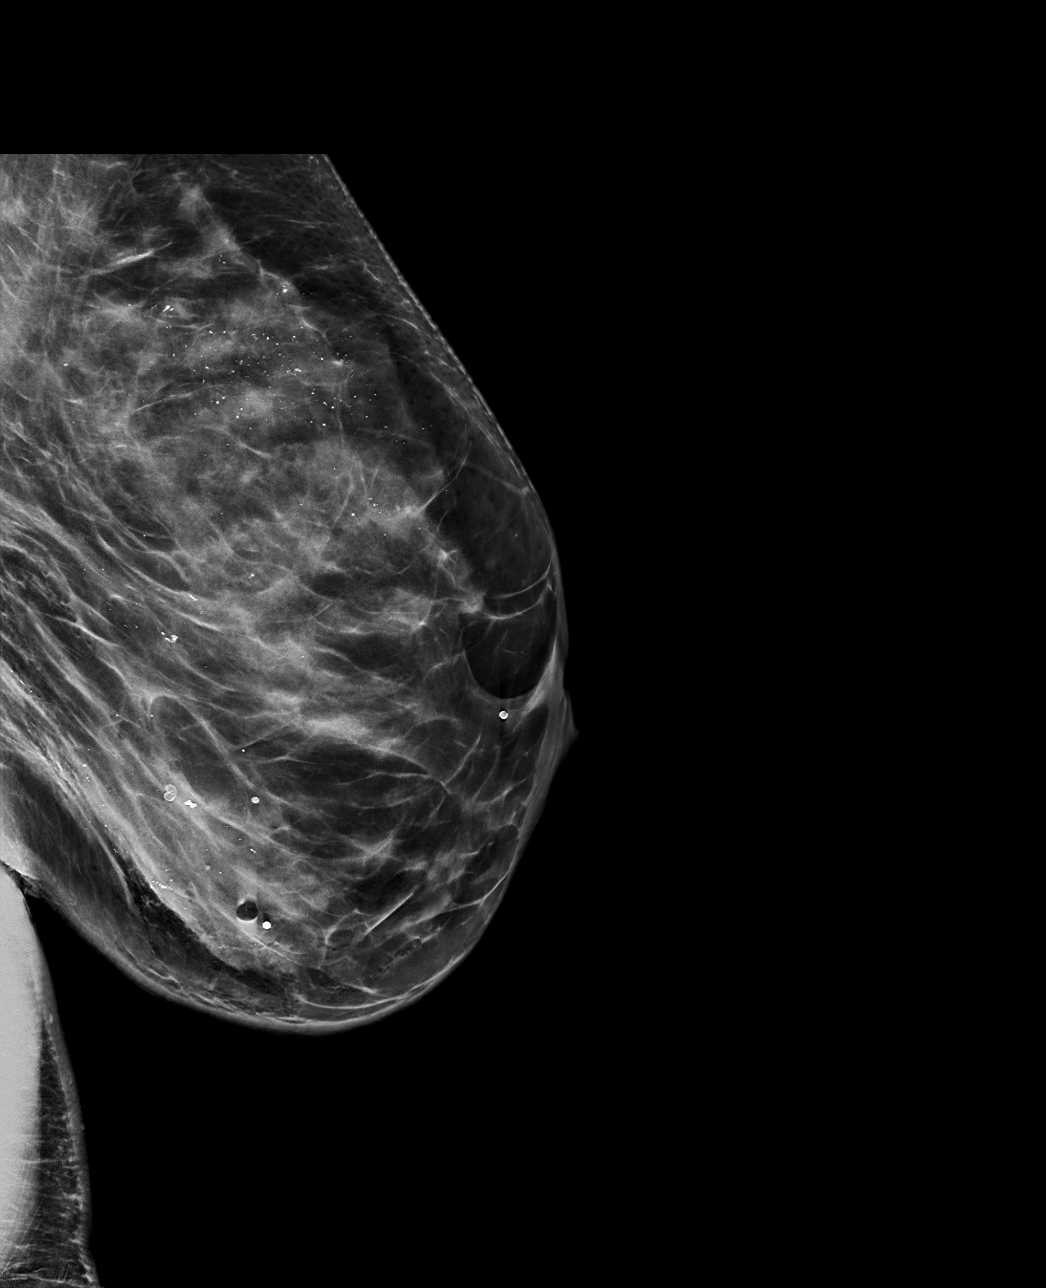

[L ML tomo · tomo slice 39/77.0]
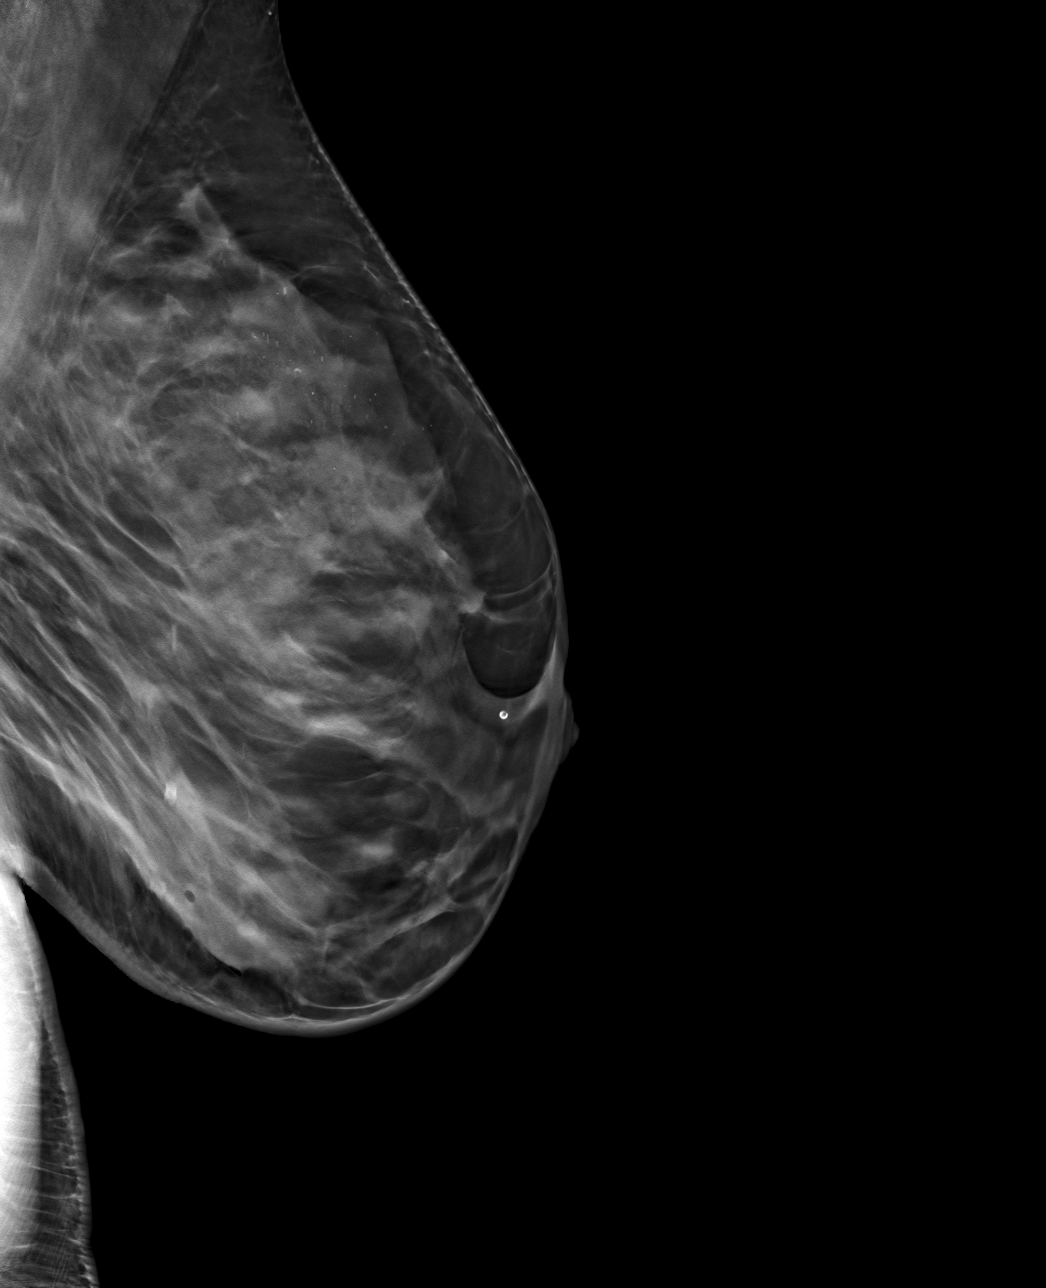

[L CC tomo · tomo slice 37/73.0]
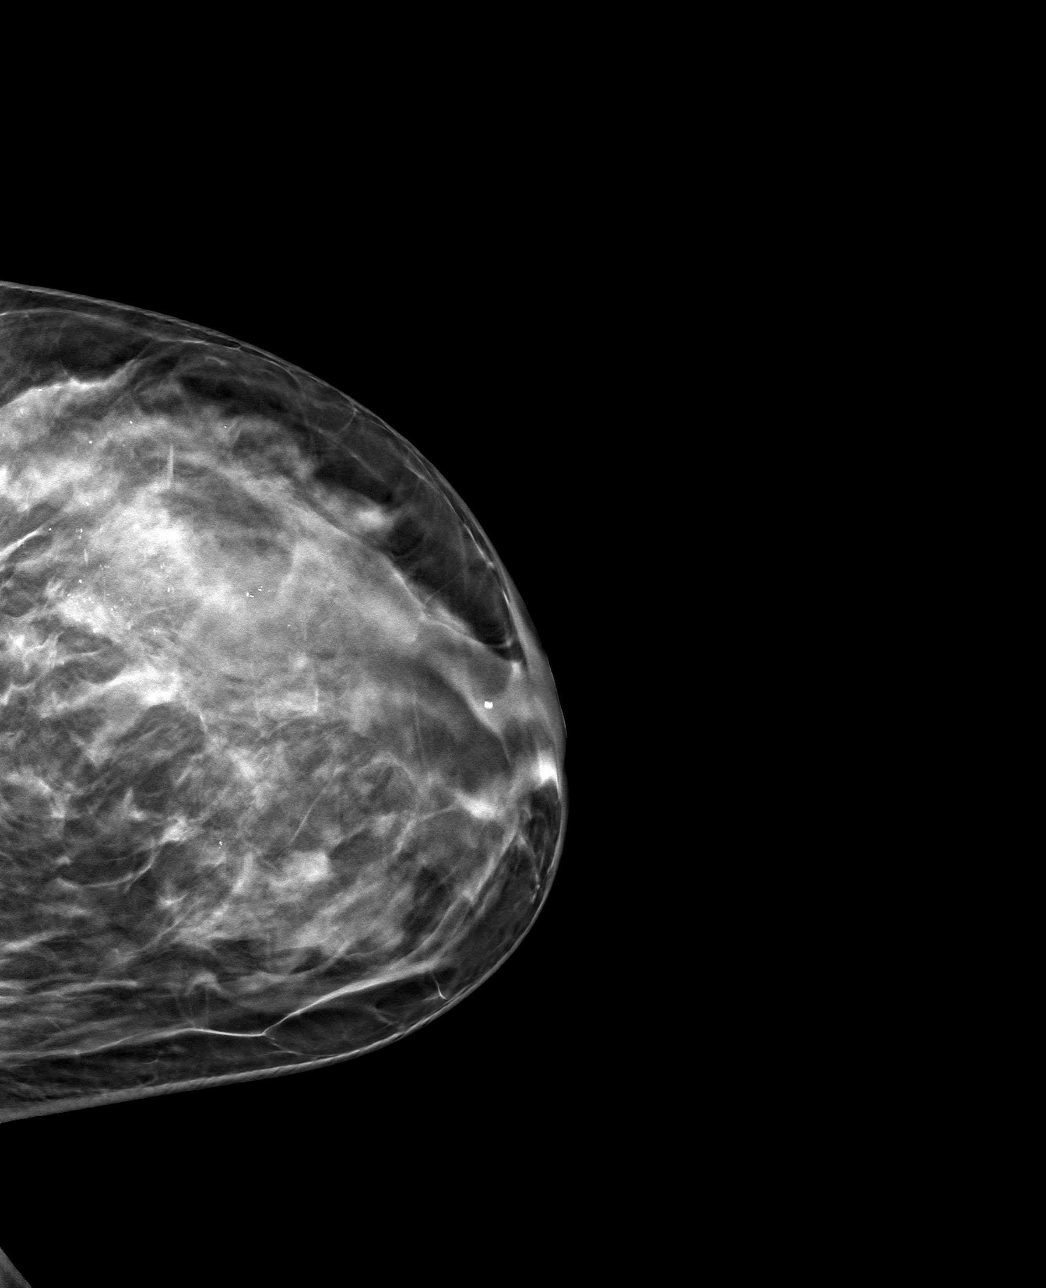

[4 of 12 positions shown; findings below may reference images not displayed]

FINDINGS: 3D Mammographic images were obtained following MRI guided biopsy of
left breast 2 sites.

Site 1: Lower outer anterior left breast: Cylinder clip: Clip
appears to be approximately 1 cm medial to the expected location.

Site 2: Inferior central left breast: Dumbbell clip: In appropriate
position.
IMPRESSION: Site 1: Lower outer anterior left breast: Cylinder clip: Clip
appears to be approximately 1 cm medial to the expected location.

Site 2: Inferior central left breast: Dumbbell clip: In appropriate
position.

Final Assessment: Post Procedure Mammograms for Marker Placement

## 2021-07-27 ENCOUNTER — Other Ambulatory Visit: Payer: Self-pay | Admitting: Family Medicine

## 2021-07-27 ENCOUNTER — Other Ambulatory Visit: Payer: Self-pay | Admitting: Nurse Practitioner

## 2021-07-27 DIAGNOSIS — E10649 Type 1 diabetes mellitus with hypoglycemia without coma: Secondary | ICD-10-CM

## 2021-07-27 DIAGNOSIS — J4521 Mild intermittent asthma with (acute) exacerbation: Secondary | ICD-10-CM

## 2021-10-02 ENCOUNTER — Other Ambulatory Visit: Payer: Self-pay | Admitting: Nurse Practitioner

## 2021-10-02 DIAGNOSIS — J4521 Mild intermittent asthma with (acute) exacerbation: Secondary | ICD-10-CM

## 2021-10-22 ENCOUNTER — Other Ambulatory Visit: Payer: Self-pay | Admitting: Family Medicine

## 2021-11-11 ENCOUNTER — Other Ambulatory Visit: Payer: Self-pay | Admitting: Family Medicine

## 2021-11-23 ENCOUNTER — Other Ambulatory Visit: Payer: Self-pay | Admitting: Nurse Practitioner

## 2021-11-23 DIAGNOSIS — J4521 Mild intermittent asthma with (acute) exacerbation: Secondary | ICD-10-CM

## 2021-12-15 LAB — HM DIABETES EYE EXAM

## 2021-12-21 ENCOUNTER — Other Ambulatory Visit: Payer: Self-pay

## 2021-12-21 ENCOUNTER — Encounter: Payer: BC Managed Care – PPO | Admitting: Nurse Practitioner

## 2021-12-21 DIAGNOSIS — E10649 Type 1 diabetes mellitus with hypoglycemia without coma: Secondary | ICD-10-CM

## 2021-12-21 MED ORDER — DEXCOM G6 TRANSMITTER MISC
4 refills | Status: DC
Start: 2021-12-21 — End: 2022-04-22

## 2021-12-30 ENCOUNTER — Other Ambulatory Visit: Payer: Self-pay | Admitting: Nurse Practitioner

## 2021-12-30 ENCOUNTER — Encounter (HOSPITAL_COMMUNITY): Payer: Self-pay

## 2021-12-30 DIAGNOSIS — E10649 Type 1 diabetes mellitus with hypoglycemia without coma: Secondary | ICD-10-CM

## 2022-01-05 ENCOUNTER — Telehealth: Payer: Self-pay

## 2022-01-05 NOTE — Telephone Encounter (Signed)
Prior Nicole Marquez was approved for [employer].  ?

## 2022-01-13 ENCOUNTER — Encounter: Payer: BC Managed Care – PPO | Admitting: Nurse Practitioner

## 2022-01-18 ENCOUNTER — Other Ambulatory Visit: Payer: Self-pay | Admitting: Family Medicine

## 2022-03-15 ENCOUNTER — Other Ambulatory Visit: Payer: Self-pay | Admitting: Family Medicine

## 2022-03-17 ENCOUNTER — Other Ambulatory Visit: Payer: Self-pay | Admitting: Nurse Practitioner

## 2022-03-17 DIAGNOSIS — J4521 Mild intermittent asthma with (acute) exacerbation: Secondary | ICD-10-CM

## 2022-03-30 ENCOUNTER — Other Ambulatory Visit: Payer: Self-pay | Admitting: Nurse Practitioner

## 2022-03-31 ENCOUNTER — Other Ambulatory Visit: Payer: Self-pay | Admitting: Nurse Practitioner

## 2022-03-31 DIAGNOSIS — E10649 Type 1 diabetes mellitus with hypoglycemia without coma: Secondary | ICD-10-CM

## 2022-04-02 ENCOUNTER — Other Ambulatory Visit: Payer: Self-pay

## 2022-04-02 DIAGNOSIS — E10649 Type 1 diabetes mellitus with hypoglycemia without coma: Secondary | ICD-10-CM

## 2022-04-02 MED ORDER — DEXCOM G6 SENSOR MISC: 0 refills | Status: DC

## 2022-04-15 ENCOUNTER — Other Ambulatory Visit: Payer: Self-pay | Admitting: Family Medicine

## 2022-04-15 ENCOUNTER — Other Ambulatory Visit: Payer: Self-pay | Admitting: Nurse Practitioner

## 2022-04-15 DIAGNOSIS — E10649 Type 1 diabetes mellitus with hypoglycemia without coma: Secondary | ICD-10-CM

## 2022-04-19 NOTE — Progress Notes (Unsigned)
Subjective:  Patient ID: Nicole Marquez, female    DOB: 1961/06/27  Age: 61 y.o. MRN: [id number]  Chief Complaint  Patient presents with   Annual Exam    HPI Encounter for general adult medical examination without abnormal findings  Physical ("At Risk" items are starred): Patient's last physical exam was 1 year ago .   Growth [employer] SmartLinks can only be used for patients less than 64 years old.   SDOH Screenings   Alcohol Screen: Not on file  Depression (PHQ2-9): Low Risk  (11/25/2020)   Depression (PHQ2-9)    PHQ-2 Score: 0  Financial Resource Strain: Not on file  Food Insecurity: Not on file  Housing: Not on file  Physical Activity: Not on file  Social Connections: Not on file  Stress: Not on file  Tobacco Use: Medium Risk (05/29/2021)   Patient History    Smoking Tobacco Use: Former    Smokeless Tobacco Use: Never    Passive Exposure: Not on file  Transportation Needs: Not on file       03/02/2021    3:42 PM 01/12/2021    1:40 PM 11/25/2020    2:18 PM  [place] in the past year? 0 0 0  Number falls in past yr: 0 0 0  Injury with Fall? 0 0 0  Risk for fall due to : No Fall Risks    Follow up Falls evaluation completed Falls evaluation completed        11/25/2020    2:18 PM 12/30/2019    6:49 PM  Depression screen PHQ 2/9  Decreased Interest 0 0  Down, Depressed, Hopeless 0 0  PHQ - 2 Score 0 0    Functional Status Survey:     Safety: reviewed ;  Patient wears a seat belt. Patient's home has smoke detectors and carbon monoxide detectors. Patient practices appropriate gun safety Patient wears sunscreen with extended sun exposure. Dental Care: biannual cleanings, brushes and flosses daily. Ophthalmology/Optometry: Annual visit.  Hearing loss: none Vision impairments: none Patient is not afflicted from Stress Incontinence and Urge Incontinence   Current Outpatient Medications on File Prior to Visit  Medication Sig Dispense Refill   Aflibercept  (EYLEA) 2 MG/0.05ML SOLN 1 each by Intravitreal route See admin instructions. Every six weeks in both eyes     albuterol (VENTOLIN HFA) 108 (90 Base) MCG/ACT inhaler INHALE 1 TO 2 PUFFS EVERY 4 HOURS AS NEEDED FOR WHEEZING 18 each 2   BD PEN NEEDLE NANO 2ND GEN 32G X 4 MM MISC 1 EACH BY DOES NOT APPLY ROUTE 4 (FOUR) TIMES DAILY. 200 each 1   benzonatate (TESSALON) 200 MG capsule Take 1 capsule (200 mg total) by mouth 3 (three) times daily as needed for cough. (Patient not taking: Reported on 05/11/2021) 33 capsule 0   Continuous Blood Gluc Sensor (DEXCOM G6 SENSOR) MISC Check sugars qac and qhs. 3 each 0   Continuous Blood Gluc Transmit (DEXCOM G6 TRANSMITTER) MISC Change transmitter every 3 months DX CODE:  E10.649 1 each 4   levothyroxine (SYNTHROID) 88 MCG tablet Take 1 tablet (88 mcg total) by mouth daily. 90 tablet 3   LORazepam (ATIVAN) 0.5 MG tablet TAKE 1 TABLET(0.5 MG) BY MOUTH DAILY AS NEEDED FOR ANXIETY 30 tablet 0   montelukast (SINGULAIR) 10 MG tablet TAKE 1 TABLET BY MOUTH EVERYDAY AT BEDTIME 90 tablet 0   oxyCODONE (OXY IR/ROXICODONE) 5 MG immediate release tablet Take 1 tablet (5 mg total) by mouth  every 6 (six) hours as needed for severe pain. 10 tablet 0   PULMICORT FLEXHALER 90 MCG/ACT inhaler INHALE 1 PUFF INTO THE LUNGS TWICE A DAY 1 each 2   ramipril (ALTACE) 10 MG capsule TAKE 1 CAPSULE BY MOUTH EVERY DAY 90 capsule 0   TRESIBA FLEXTOUCH 200 UNIT/ML FlexTouch Pen ADMINISTER 28 UNITS UNDER THE SKIN DAILY 3 mL 1   No current facility-administered medications on file prior to visit.    Social Hx   Social History   Socioeconomic History   Marital status: Married    Spouse name: Not on file   Number of children: 4   Years of education: Not on file   Highest education level: Not on file  Occupational History   Not on file  Tobacco Use   Smoking status: Former    Types: Cigarettes    Quit date: 2008    Years since quitting: 15.6   Smokeless tobacco: Never  Vaping Use    Vaping Use: Never used  Substance and Sexual Activity   Alcohol use: Never   Drug use: Never   Sexual activity: Not on file  Other Topics Concern   Not on file  Social History Narrative   Patient has 1 set of twins and two individual children.   Social Determinants of Health   Financial Resource Strain: Not on file  Food Insecurity: Not on file  Transportation Needs: Not on file  Physical Activity: Not on file  Stress: Not on file  Social Connections: Not on file   Past Medical History:  Diagnosis Date   Asthma    Breast cancer ([place]) 2022   Exudative age-related macular degeneration, bilateral, stage unspecified ([place])    Family history of colon cancer 02/27/2021   Family history of malignant neoplasm of digestive organs    Mixed hyperlipidemia    Other specified hypothyroidism    Type 1 diabetes mellitus with hypoglycemia without coma ([place])    Family History  Problem Relation Age of Onset   Cancer - Other Mother 24       carcinoid of colon   Diabetes Father    Kidney failure Sister    Colon cancer Brother 34   Lung cancer Paternal Aunt        dx >50; smoking hx   Cancer - Other Maternal Grandfather 75       carcinoid of colon    Review of Systems   Objective:  LMP 04/22/2014 (Exact Date)      05/28/2021   11:40 AM 05/28/2021   11:25 AM 05/28/2021   11:10 AM  BP/Weight  Systolic BP 458 099 833  Diastolic BP 57 71 96    Physical Exam  Lab Results  Component Value Date   WBC 6.3 05/15/2021   HGB 14.2 05/15/2021   HCT 42.5 05/15/2021   PLT 277 05/15/2021   GLUCOSE 98 05/15/2021   CHOL 326 (H) 11/24/2020   TRIG 103 11/24/2020   HDL 63 11/24/2020   LDLCALC 246 (H) 11/24/2020   ALT 15 02/25/2021   AST 18 02/25/2021   NA 140 05/15/2021   K 4.5 05/15/2021   CL 103 05/15/2021   CREATININE 0.81 05/15/2021   BUN 13 05/15/2021   CO2 29 05/15/2021   TSH 0.465 11/24/2020   HGBA1C 6.9 (H) 05/15/2021   MICROALBUR 30 11/25/2020      Assessment &  Plan:  1. Routine medical exam    No orders of the defined types were placed  in this encounter.   These are the goals we discussed:  Goals   None      This is a list of the screening recommended for you and due dates:  Health Maintenance  Topic Date Due   COVID-19 Vaccine (1) Never done   Eye exam for diabetics  Never done   HIV Screening  Never done   Hepatitis C Screening: USPSTF Recommendation to screen - Ages 22-79 yo.  Never done   Tetanus Vaccine  Never done   Zoster (Shingles) Vaccine (1 of 2) Never done   Pap Smear  Never done   Colon Cancer Screening  Never done   Hemoglobin A1C  11/12/2021   Complete foot exam   11/25/2021   Flu Shot  04/13/2022   Mammogram  02/14/2023   HPV Vaccine  Aged Out      AN INDIVIDUALIZED CARE PLAN: was established or reinforced today.   SELF MANAGEMENT: The patient and I together assessed ways to personally work towards obtaining the recommended goals  Support needs The patient and/or family needs were assessed and services were offered and not necessary at this time.    Follow-up: No follow-ups on file. [place] [phone]

## 2022-04-20 ENCOUNTER — Other Ambulatory Visit: Payer: Self-pay

## 2022-04-20 ENCOUNTER — Telehealth: Payer: Self-pay

## 2022-04-20 MED ORDER — CLONAZEPAM 0.5 MG PO TABS: 0.5000 mg | ORAL_TABLET | Freq: Every day | ORAL | 1 refills | Status: DC | PRN

## 2022-04-20 NOTE — Telephone Encounter (Signed)
Lorazepam is on manufacturer back order.  Left message to notify patient.  Dr. Tobie Poet is going to send clonazepam 0.5 mg to be used daily as needed.

## 2022-04-22 ENCOUNTER — Encounter: Payer: Self-pay | Admitting: Nurse Practitioner

## 2022-04-22 ENCOUNTER — Other Ambulatory Visit: Payer: Self-pay | Admitting: Nurse Practitioner

## 2022-04-22 ENCOUNTER — Ambulatory Visit (INDEPENDENT_AMBULATORY_CARE_PROVIDER_SITE_OTHER): Payer: BC Managed Care – PPO | Admitting: Nurse Practitioner

## 2022-04-22 VITALS — BP 108/64 | HR 80 | Resp 18 | Ht 60.0 in | Wt 130.0 lb

## 2022-04-22 DIAGNOSIS — H6123 Impacted cerumen, bilateral: Secondary | ICD-10-CM

## 2022-04-22 DIAGNOSIS — Z2821 Immunization not carried out because of patient refusal: Secondary | ICD-10-CM

## 2022-04-22 DIAGNOSIS — E10649 Type 1 diabetes mellitus with hypoglycemia without coma: Secondary | ICD-10-CM

## 2022-04-22 DIAGNOSIS — Z0000 Encounter for general adult medical examination without abnormal findings: Secondary | ICD-10-CM

## 2022-04-22 DIAGNOSIS — Z6825 Body mass index (BMI) 25.0-25.9, adult: Secondary | ICD-10-CM | POA: Diagnosis not present

## 2022-04-22 DIAGNOSIS — J4521 Mild intermittent asthma with (acute) exacerbation: Secondary | ICD-10-CM

## 2022-04-22 MED ORDER — ALBUTEROL SULFATE HFA 108 (90 BASE) MCG/ACT IN AERS: INHALATION_SPRAY | RESPIRATORY_TRACT | 2 refills | Status: DC

## 2022-04-22 MED ORDER — MONTELUKAST SODIUM 10 MG PO TABS: ORAL_TABLET | ORAL | 0 refills | Status: DC

## 2022-04-22 MED ORDER — DEXCOM G6 SENSOR MISC: 0 refills | Status: DC

## 2022-04-22 MED ORDER — LEVOTHYROXINE SODIUM 88 MCG PO TABS: 88.0000 ug | ORAL_TABLET | Freq: Every day | ORAL | 3 refills | Status: DC

## 2022-04-22 MED ORDER — PULMICORT FLEXHALER 90 MCG/ACT IN AEPB: INHALATION_SPRAY | RESPIRATORY_TRACT | 2 refills | Status: DC

## 2022-04-22 MED ORDER — DEXCOM G6 TRANSMITTER MISC: 4 refills | Status: DC

## 2022-04-22 MED ORDER — BD PEN NEEDLE NANO 2ND GEN 32G X 4 MM MISC: 1 refills | Status: DC

## 2022-04-22 MED ORDER — RAMIPRIL 10 MG PO CAPS: 10.0000 mg | ORAL_CAPSULE | Freq: Every day | ORAL | 1 refills | Status: DC

## 2022-04-22 MED ORDER — TRESIBA FLEXTOUCH 200 UNIT/ML ~~LOC~~ SOPN: PEN_INJECTOR | SUBCUTANEOUS | 1 refills | Status: DC

## 2022-04-22 NOTE — Patient Instructions (Addendum)
Consider pneumonia vaccines Consider Shingles vaccine Continue medications We will call you with lab results Use Debrox drops as directed for ear wax bilaterally Follow-up in 52-month, fasting, with Dr CTobie Poet  Zoster Vaccine, Recombinant injection What is this medication? ZOSTER VACCINE (ZOS ter vak SEEN) is a vaccine used to reduce the risk of getting shingles. This vaccine is not used to treat shingles or nerve pain from shingles. This medicine may be used for other purposes; ask your health care provider or pharmacist if you have questions. COMMON BRAND NAME(S): [facility]What should I tell my care team before I take this medication? They need to know if you have any of these conditions: cancer immune system problems an unusual or allergic reaction to Zoster vaccine, other medications, foods, dyes, or preservatives pregnant or trying to get pregnant breast-feeding How should I use this medication? This vaccine is injected into a muscle. It is given by a health care provider. A copy of Vaccine Information Statements will be given before each vaccination. Be sure to read this information carefully each time. This sheet may change often. Talk to your health care provider about the use of this vaccine in children. This vaccine is not approved for use in children. Overdosage: If you think you have taken too much of this medicine contact a poison control center or emergency room at once. NOTE: This medicine is only for you. Do not share this medicine with others. What if I miss a dose? Keep appointments for follow-up (booster) doses. It is important not to miss your dose. Call your health care provider if you are unable to keep an appointment. What may interact with this medication? medicines that suppress your immune system medicines to treat cancer steroid medicines like prednisone or cortisone This list may not describe all possible interactions. Give your health care provider a list of  all the medicines, herbs, non-prescription drugs, or dietary supplements you use. Also tell them if you smoke, drink alcohol, or use illegal drugs. Some items may interact with your medicine. What should I watch for while using this medication? Visit your health care provider regularly. This vaccine, like all vaccines, may not fully protect everyone. What side effects may I notice from receiving this medication? Side effects that you should report to your doctor or health care professional as soon as possible: allergic reactions (skin rash, itching or hives; swelling of the face, lips, or tongue) trouble breathing Side effects that usually do not require medical attention (report these to your doctor or health care professional if they continue or are bothersome): chills headache fever nausea pain, redness, or irritation at site where injected tiredness vomiting This list may not describe all possible side effects. Call your doctor for medical advice about side effects. You may report side effects to FDA at 1-800-FDA-1088. Where should I keep my medication? This vaccine is only given by a health care provider. It will not be stored at home. NOTE: This sheet is a summary. It may not cover all possible information. If you have questions about this medicine, talk to your doctor, pharmacist, or health care provider.  2023 Elsevier/Gold Standard (2021-07-31 00:00:00)    Pneumococcal Conjugate Vaccine (Prevnar 20) Suspension for Injection What is this medication? PNEUMOCOCCAL VACCINE (NEU mo KOK al vak SEEN) is a vaccine. It prevents pneumococcus bacterial infections. These bacteria can cause serious infections like pneumonia, meningitis, and blood infections. This vaccine will not treat an infection and will not cause infection. This vaccine is recommended  for adults 18 years and older. This medicine may be used for other purposes; ask your health care provider or pharmacist if you have  questions. COMMON BRAND NAME(S): Prevnar 20 What should I tell my care team before I take this medication? They need to know if you have any of these conditions: bleeding disorder fever immune system problems an unusual or allergic reaction to pneumococcal vaccine, diphtheria toxoid, other vaccines, other medicines, foods, dyes, or preservatives pregnant or trying to get pregnant breast-feeding How should I use this medication? This vaccine is injected into a muscle. It is given by a health care provider. A copy of Vaccine Information Statements will be given before each vaccination. Be sure to read this information carefully each time. This sheet may change often. Talk to your health care provider about the use of this medicine in children. Special care may be needed. Overdosage: If you think you have taken too much of this medicine contact a poison control center or emergency room at once. NOTE: This medicine is only for you. Do not share this medicine with others. What if I miss a dose? This does not apply. This medicine is not for regular use. What may interact with this medication? medicines for cancer chemotherapy medicines that suppress your immune function steroid medicines like prednisone or cortisone This list may not describe all possible interactions. Give your health care provider a list of all the medicines, herbs, non-prescription drugs, or dietary supplements you use. Also tell them if you smoke, drink alcohol, or use illegal drugs. Some items may interact with your medicine. What should I watch for while using this medication? Mild fever and pain should go away in 3 days or less. Report any unusual symptoms to your health care provider. What side effects may I notice from receiving this medication? Side effects that you should report to your doctor or health care professional as soon as possible: allergic reactions (skin rash, itching or hives; swelling of the face, lips,  or tongue) confusion fast, irregular heartbeat fever over 102 degrees F muscle weakness seizures trouble breathing unusual bruising or bleeding Side effects that usually do not require medical attention (report to your doctor or health care professional if they continue or are bothersome): fever of 102 degrees F or less headache joint pain muscle cramps, pain pain, tender at site where injected This list may not describe all possible side effects. Call your doctor for medical advice about side effects. You may report side effects to FDA at 1-800-FDA-1088. Where should I keep my medication? This vaccine is only given by a health care provider. It will not be stored at home. NOTE: This sheet is a summary. It may not cover all possible information. If you have questions about this medicine, talk to your doctor, pharmacist, or health care provider.  2023 Elsevier/Gold Standard (2020-05-02 00:00:00)    Preventive Care 76-60 Years Old, Female Preventive care refers to lifestyle choices and visits with your health care provider that can promote health and wellness. Preventive care visits are also called wellness exams. What can I expect for my preventive care visit? Counseling Your health care provider may ask you questions about your: Medical history, including: Past medical problems. Family medical history. Pregnancy history. Current health, including: Menstrual cycle. Method of birth control. Emotional well-being. Home life and relationship well-being. Sexual activity and sexual health. Lifestyle, including: Alcohol, nicotine or tobacco, and drug use. Access to firearms. Diet, exercise, and sleep habits. Work and work Statistician. Sunscreen use.  Safety issues such as seatbelt and bike helmet use. Physical exam Your health care provider will check your: Height and weight. These may be used to calculate your BMI (body mass index). BMI is a measurement that tells if you are at  a healthy weight. Waist circumference. This measures the distance around your waistline. This measurement also tells if you are at a healthy weight and may help predict your risk of certain diseases, such as type 2 diabetes and high blood pressure. Heart rate and blood pressure. Body temperature. Skin for abnormal spots. What immunizations do I need?  Vaccines are usually given at various ages, according to a schedule. Your health care provider will recommend vaccines for you based on your age, medical history, and lifestyle or other factors, such as travel or where you work. What tests do I need? Screening Your health care provider may recommend screening tests for certain conditions. This may include: Lipid and cholesterol levels. Diabetes screening. This is done by checking your blood sugar (glucose) after you have not eaten for a while (fasting). Pelvic exam and Pap test. Hepatitis B test. Hepatitis C test. HIV (human immunodeficiency virus) test. STI (sexually transmitted infection) testing, if you are at risk. Lung cancer screening. Colorectal cancer screening. Mammogram. Talk with your health care provider about when you should start having regular mammograms. This may depend on whether you have a family history of breast cancer. BRCA-related cancer screening. This may be done if you have a family history of breast, ovarian, tubal, or peritoneal cancers. Bone density scan. This is done to screen for osteoporosis. Talk with your health care provider about your test results, treatment options, and if necessary, the need for more tests. Follow these instructions at home: Eating and drinking  Eat a diet that includes fresh fruits and vegetables, whole grains, lean protein, and low-fat dairy products. Take vitamin and mineral supplements as recommended by your health care provider. Do not drink alcohol if: Your health care provider tells you not to drink. You are pregnant, may be  pregnant, or are planning to become pregnant. If you drink alcohol: Limit how much you have to 0-1 drink a day. Know how much alcohol is in your drink. In the [place] one drink equals one 12 oz bottle of beer (355 mL), one 5 oz glass of wine (148 mL), or one 1 oz glass of hard liquor (44 mL). Lifestyle Brush your teeth every morning and night with fluoride toothpaste. Floss one time each day. Exercise for at least 30 minutes 5 or more days each week. Do not use any products that contain nicotine or tobacco. These products include cigarettes, chewing tobacco, and vaping devices, such as e-cigarettes. If you need help quitting, ask your health care provider. Do not use drugs. If you are sexually active, practice safe sex. Use a condom or other form of protection to prevent STIs. If you do not wish to become pregnant, use a form of birth control. If you plan to become pregnant, see your health care provider for a prepregnancy visit. Take aspirin only as told by your health care provider. Make sure that you understand how much to take and what form to take. Work with your health care provider to find out whether it is safe and beneficial for you to take aspirin daily. Find healthy ways to manage stress, such as: Meditation, yoga, or listening to music. Journaling. Talking to a trusted person. Spending time with friends and family. Minimize exposure to UV radiation  to reduce your risk of skin cancer. Safety Always wear your seat belt while driving or riding in a vehicle. Do not drive: If you have been drinking alcohol. Do not ride with someone who has been drinking. When you are tired or distracted. While texting. If you have been using any mind-altering substances or drugs. Wear a helmet and other protective equipment during sports activities. If you have firearms in your house, make sure you follow all gun safety procedures. Seek help if you have been physically or sexually abused. What's  next? Visit your health care provider once a year for an annual wellness visit. Ask your health care provider how often you should have your eyes and teeth checked. Stay up to date on all vaccines. This information is not intended to replace advice given to you by your health care provider. Make sure you discuss any questions you have with your health care provider. Document Revised: 02/25/2021 Document Reviewed: 02/25/2021 Elsevier Patient Education  Mobile.

## 2022-04-23 LAB — COMPREHENSIVE METABOLIC PANEL
ALT: 14 IU/L (ref 0–32)
AST: 16 IU/L (ref 0–40)
Albumin/Globulin Ratio: 2.6 — ABNORMAL HIGH (ref 1.2–2.2)
Albumin: 4.6 g/dL (ref 3.9–4.9)
Alkaline Phosphatase: 83 IU/L (ref 44–121)
BUN/Creatinine Ratio: 23 (ref 12–28)
BUN: 16 mg/dL (ref 8–27)
Bilirubin Total: 0.3 mg/dL (ref 0.0–1.2)
CO2: 24 mmol/L (ref 20–29)
Calcium: 9.8 mg/dL (ref 8.7–10.3)
Chloride: 103 mmol/L (ref 96–106)
Creatinine, Ser: 0.69 mg/dL (ref 0.57–1.00)
Globulin, Total: 1.8 g/dL (ref 1.5–4.5)
Glucose: 106 mg/dL — ABNORMAL HIGH (ref 70–99)
Potassium: 4.6 mmol/L (ref 3.5–5.2)
Sodium: 141 mmol/L (ref 134–144)
Total Protein: 6.4 g/dL (ref 6.0–8.5)
eGFR: 97 mL/min/{1.73_m2} (ref >59)

## 2022-04-23 LAB — CBC WITH DIFFERENTIAL/PLATELET
Basophils Absolute: 0.1 10*3/uL (ref 0.0–0.2)
Basos: 2 %
EOS (ABSOLUTE): 0.1 10*3/uL (ref 0.0–0.4)
Eos: 2 %
Hematocrit: 31.8 % — ABNORMAL LOW (ref 34.0–46.6)
Hemoglobin: 9.2 g/dL — ABNORMAL LOW (ref 11.1–15.9)
Immature Grans (Abs): 0 10*3/uL (ref 0.0–0.1)
Immature Granulocytes: 0 %
Lymphocytes Absolute: 2 10*3/uL (ref 0.7–3.1)
Lymphs: 37 %
MCH: 20.3 pg — ABNORMAL LOW (ref 26.6–33.0)
MCHC: 28.9 g/dL — ABNORMAL LOW (ref 31.5–35.7)
MCV: 70 fL — ABNORMAL LOW (ref 79–97)
Monocytes Absolute: 0.5 10*3/uL (ref 0.1–0.9)
Monocytes: 9 %
Neutrophils Absolute: 2.7 10*3/uL (ref 1.4–7.0)
Neutrophils: 50 %
Platelets: 325 10*3/uL (ref 150–450)
RBC: 4.54 x10E6/uL (ref 3.77–5.28)
RDW: 14.1 % (ref 11.7–15.4)
WBC: 5.4 10*3/uL (ref 3.4–10.8)

## 2022-04-23 LAB — LIPID PANEL
Chol/HDL Ratio: 4.1 ratio (ref 0.0–4.4)
Cholesterol, Total: 246 mg/dL — ABNORMAL HIGH (ref 100–199)
HDL: 60 mg/dL (ref >39)
LDL Chol Calc (NIH): 173 mg/dL — ABNORMAL HIGH (ref 0–99)
Triglycerides: 79 mg/dL (ref 0–149)
VLDL Cholesterol Cal: 13 mg/dL (ref 5–40)

## 2022-04-23 LAB — CARDIOVASCULAR RISK ASSESSMENT

## 2022-04-23 LAB — MICROALBUMIN / CREATININE URINE RATIO
Creatinine, Urine: 131.5 mg/dL
Microalb/Creat Ratio: 11 mg/g creat (ref 0–29)
Microalbumin, Urine: 15.1 ug/mL

## 2022-04-23 LAB — TSH: TSH: 0.108 u[IU]/mL — ABNORMAL LOW (ref 0.450–4.500)

## 2022-04-23 LAB — T4, FREE: Free T4: 2.08 ng/dL — ABNORMAL HIGH (ref 0.82–1.77)

## 2022-04-27 ENCOUNTER — Other Ambulatory Visit: Payer: Self-pay

## 2022-04-27 DIAGNOSIS — E038 Other specified hypothyroidism: Secondary | ICD-10-CM

## 2022-04-27 MED ORDER — LEVOTHYROXINE SODIUM 75 MCG PO TABS: 75.0000 ug | ORAL_TABLET | Freq: Every day | ORAL | 1 refills | Status: DC

## 2022-04-28 LAB — FERRITIN: Ferritin: 7 ng/mL — ABNORMAL LOW (ref 15–150)

## 2022-04-28 LAB — HGB A1C W/O EAG: Hgb A1c MFr Bld: 7.2 % — ABNORMAL HIGH (ref 4.8–5.6)

## 2022-04-28 LAB — IRON AND TIBC
Iron Saturation: 4 % — CL (ref 15–55)
Iron: 18 ug/dL — ABNORMAL LOW (ref 27–139)
Total Iron Binding Capacity: 426 ug/dL (ref 250–450)
UIBC: 408 ug/dL — ABNORMAL HIGH (ref 118–369)

## 2022-04-28 LAB — SPECIMEN STATUS REPORT

## 2022-05-03 ENCOUNTER — Other Ambulatory Visit: Payer: Self-pay

## 2022-05-03 DIAGNOSIS — D509 Iron deficiency anemia, unspecified: Secondary | ICD-10-CM

## 2022-05-07 ENCOUNTER — Encounter: Payer: Self-pay | Admitting: Gastroenterology

## 2022-05-12 ENCOUNTER — Encounter: Payer: Self-pay | Admitting: Gastroenterology

## 2022-05-12 ENCOUNTER — Encounter: Payer: Self-pay | Admitting: Family Medicine

## 2022-05-12 ENCOUNTER — Ambulatory Visit: Payer: BC Managed Care – PPO | Admitting: Gastroenterology

## 2022-05-12 ENCOUNTER — Other Ambulatory Visit (INDEPENDENT_AMBULATORY_CARE_PROVIDER_SITE_OTHER): Payer: BC Managed Care – PPO

## 2022-05-12 VITALS — BP 118/60 | HR 72 | Ht 60.0 in | Wt 131.1 lb

## 2022-05-12 DIAGNOSIS — Z8371 Family history of colonic polyps: Secondary | ICD-10-CM

## 2022-05-12 DIAGNOSIS — Z800 Family history of malignant neoplasm of digestive organs: Secondary | ICD-10-CM

## 2022-05-12 DIAGNOSIS — D509 Iron deficiency anemia, unspecified: Secondary | ICD-10-CM

## 2022-05-12 LAB — CBC WITH DIFFERENTIAL/PLATELET
Basophils Absolute: 0.1 10*3/uL (ref 0.0–0.1)
Basophils Relative: 1.3 % (ref 0.0–3.0)
Eosinophils Absolute: 0.1 10*3/uL (ref 0.0–0.7)
Eosinophils Relative: 1.4 % (ref 0.0–5.0)
HCT: 32.3 % — ABNORMAL LOW (ref 36.0–46.0)
Hemoglobin: 9.9 g/dL — ABNORMAL LOW (ref 12.0–15.0)
Lymphocytes Relative: 26.9 % (ref 12.0–46.0)
Lymphs Abs: 1.5 10*3/uL (ref 0.7–4.0)
MCHC: 30.7 g/dL (ref 30.0–36.0)
MCV: 69 fl — ABNORMAL LOW (ref 78.0–100.0)
Monocytes Absolute: 0.3 10*3/uL (ref 0.1–1.0)
Monocytes Relative: 5.9 % (ref 3.0–12.0)
Neutro Abs: 3.7 10*3/uL (ref 1.4–7.7)
Neutrophils Relative %: 64.5 % (ref 43.0–77.0)
Platelets: 247 10*3/uL (ref 150.0–400.0)
RBC: 4.68 Mil/uL (ref 3.87–5.11)
RDW: 19 % — ABNORMAL HIGH (ref 11.5–15.5)
WBC: 5.7 10*3/uL (ref 4.0–10.5)

## 2022-05-12 NOTE — Patient Instructions (Addendum)
It was a pleasure to meet you today. Based on our discussion, I am providing you with my recommendations below:  RECOMMENDATION(S):   Please stop in the lab so we can follow-up on your blood counts.  I recommended an upper endoscopy and colonoscopy to evaluate for GI blood loss contributing to your anemia.  I recommend consultation with hematology to consider IV iron.  Please go to the ER with progressive fatigue, breathlessness, rapid heart rate, chest pain, altered mentation, or any trouble breathing.  COLONOSCOPY/UPPER ENDOSCOPY:   You have been scheduled for a colonoscopy/upper endoscopy. Please follow written instructions given to you at your visit today.   PREP:   Please pick up your prep supplies at the pharmacy within the next 1-3 days.  INHALERS:   If you use inhalers (even only as needed), please bring them with you on the day of your procedure.  COLONOSCOPY TIPS:  To reduce nausea and dehydration, stay well hydrated for 3-4 days prior to the exam.  To prevent skin/hemorrhoid irritation - prior to wiping, put A&Dointment or vaseline on the toilet paper. Keep a towel or pad on the bed.  BEFORE STARTING YOUR PREP, drink  64oz of clear liquids in the morning. This will help to flush the colon and will ensure you are well hydrated!!!!  NOTE - This is in addition to the fluids required for to complete your prep. Use of a flavored hard candy, such as grape Anise Salvo, can counteract some of the flavor of the prep and may prevent some nausea.    FOLLOW UP:  After your procedure, you will receive a call from my office staff regarding my recommendation for follow up.  BMI:  If you are age 59 or older, your body mass index should be between 23-30. Your Body mass index is 25.61 kg/m. If this is out of the aforementioned range listed, please consider follow up with your Primary Care Provider.  If you are age 35 or younger, your body mass index should be between 19-25. Your  Body mass index is 25.61 kg/m. If this is out of the aformentioned range listed, please consider follow up with your Primary Care Provider.   MY CHART:  The [facility] providers would like to encourage you to use [facility] to communicate with providers for non-urgent requests or questions.  Due to long hold times on the telephone, sending your provider a message by [facility] may be a faster and more efficient way to get a response.  Please allow 48 business hours for a response.  Please remember that this is for non-urgent requests.   Thank you for trusting me with your gastrointestinal care!    Thornton Park, MD, MPH

## 2022-05-12 NOTE — Progress Notes (Signed)
Referring Provider: Rochel Brome, MD Primary Care Physician:  Rochel Brome, MD   Reason for Consultation: Iron deficiency anemia   IMPRESSION:  Symptomatic iron deficiency anemia without overt or occult GI bleeding Family history of colon cancer (brother at age 63) Family history of colon polyps (both sisters) Family history of colon carcinoid (maternal grandfather at age 71) Last colonoscopy 2011 in [place] Daily ibuprofen for headaches  Endoscopic evaluation recommended to evaluate for possible GI blood loss anemia.  Differential is broad and includes upper and lower etiologies such as NSAID related peptic ulcer disease and even malignancy.  PLAN: - CBC today - Go to the ER with symptoms of progressive anemia - EGD and colonoscopy - Referral to hematology to consider IV iron   HPI: Nicole Marquez is a 61 y.o. female referred by Dr. Tobie Poet for iron deficiency anemia.  The history is obtained through the patient and review of her electronic health record.  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 Her husband accompanies her to this appointment and contributes to the history.   Annual labs obtained 04/22/2022 showed a new anemia with a hemoglobin of 9.2, MCV 70, RDW 14.1.  Hemoglobin was normal at 14.21-year ago.  BUN was normal.  Follow-up labs earlier this month show a ferritin of 7 and an iron of 18  Current GI complaints include postprandial abdominal pain and bloating, reflux, early satiety. Symptoms initially started prior to breast cancer surgery, but, may have temporarily improved until recently. Has a daily bowel movement. Takes probiotics to help with her digestion and this may have initially helped with her bloating.  She reports fatigue, lightheadedness, and dizziness that is recently worsen. She has been unable due to the symptoms.  Some headache  requiring daily ibuprofen. Pica to ice.  No beeturia.  No hearing loss.  Started oral iron but then stopped it when she read it might interfere with endoscopy.   No overt GI blood loss. No melena, hematochezia, bright red blood per rectum. No epistaxis, vaginal bleeding, hemoptysis, or hematuria.   History of symptomatic anemia due to vaginal bleeding requiring IV iron x 2 in 2011. Ultimately had endometrial ablation with resolution. Colonoscopy done at that time showed no polyps.   No identified exacerbating or relieving features.   Last colonoscopy in 2011 in [place]. No polyps at that time.   Mother with carcinoid of the colon at age 68. Sisters both had colon polyps. Sister with gastric ulcer.  Brother with colon cancer at age 30. There is no other known family history of colon cancer or polyps. No family history of stomach cancer or other GI malignancy. No family history of inflammatory bowel disease or celiac.    Past Medical History:  Diagnosis Date   Asthma    Breast cancer ([place]) 2022   DCIS (ductal carcinoma in situ)    Exudative age-related macular degeneration, bilateral, stage unspecified ([place])    Family history of colon cancer 02/27/2021   Family history of malignant neoplasm of digestive organs    Mixed hyperlipidemia    Other specified hypothyroidism    Type 1 diabetes mellitus with hypoglycemia without coma ([place])     Past Surgical History:  Procedure Laterality Date   BREAST LUMPECTOMY WITH RADIOACTIVE SEED LOCALIZATION Right 05/28/2021   Procedure: RIGHT BREAST LUMPECTOMY WITH RADIOACTIVE SEED LOCALIZATION X2;  Surgeon: [name], MD;  Location: [place];  Service:  General;  Laterality: Right;   CESAREAN SECTION     x3   ENDOMETRIAL ABLATION     Right rotator cuff repair        Current Outpatient Medications  Medication Sig Dispense Refill   albuterol (VENTOLIN HFA) 108 (90 Base) MCG/ACT inhaler INHALE 1 TO 2 PUFFS EVERY 4 HOURS AS NEEDED FOR WHEEZING 18  each 2   Budesonide (PULMICORT FLEXHALER) 90 MCG/ACT inhaler INHALE 1 PUFF INTO THE LUNGS TWICE A DAY 1 each 2   Continuous Blood Gluc Sensor (DEXCOM G6 SENSOR) MISC Check sugars qac and qhs. 3 each 0   Continuous Blood Gluc Transmit (DEXCOM G6 TRANSMITTER) MISC Change transmitter every 3 months DX CODE:  E10.649 1 each 4   faricimab-svoa (VABYSMO) 6 MG/0.05ML SOLN intravitreal injection 6 mg by Intravitreal route once.     insulin degludec (TRESIBA FLEXTOUCH) 200 UNIT/ML FlexTouch Pen ADMINISTER 28 UNITS UNDER THE SKIN DAILY 3 mL 1   Insulin Pen Needle (BD PEN NEEDLE NANO 2ND GEN) 32G X 4 MM MISC 1 EACH BY DOES NOT APPLY ROUTE 4 (FOUR) TIMES DAILY. 200 each 1   levothyroxine (SYNTHROID) 75 MCG tablet Take 1 tablet (75 mcg total) by mouth daily before breakfast. 30 tablet 1   montelukast (SINGULAIR) 10 MG tablet TAKE 1 TABLET BY MOUTH EVERYDAY AT BEDTIME 90 tablet 0   ramipril (ALTACE) 10 MG capsule Take 1 capsule (10 mg total) by mouth daily. 90 capsule 1   Aflibercept (EYLEA) 2 MG/0.05ML SOLN 1 each by Intravitreal route See admin instructions. Every six weeks in both eyes (Patient not taking: Reported on 05/12/2022)     No current facility-administered medications for this visit.    Allergies as of 05/12/2022 - Review Complete 05/12/2022  Allergen Reaction Noted   Ciprofloxacin Hives 12/24/2019   Lipitor [atorvastatin]  11/25/2020    Family History  Problem Relation Age of Onset   Cancer - Other Mother 84       carcinoid of colon   Diabetes Father    Colon cancer Brother 38   Cancer - Other Maternal Grandfather 65       carcinoid of colon   Lung cancer Paternal Aunt        dx >50; smoking hx    Social History   Socioeconomic History   Marital status: Married    Spouse name: Not on file   Number of children: 4   Years of education: Not on file   Highest education level: Not on file  Occupational History   Not on file  Tobacco Use   Smoking status: Former    Types:  Cigarettes    Quit date: 2008    Years since quitting: 15.6   Smokeless tobacco: Never  Vaping Use   Vaping Use: Never used  Substance and Sexual Activity   Alcohol use: Never   Drug use: Never   Sexual activity: Not on file  Other Topics Concern   Not on file  Social History Narrative   Patient has 1 set of twins and two individual children.   Social Determinants of Health   Financial Resource Strain: Not on file  Food Insecurity: Not on file  Transportation Needs: Not on file  Physical Activity: Not on file  Stress: Not on file  Social Connections: Not on file  Intimate Partner Violence: Not on file    Review of Systems: 12 system ROS is negative except as noted above with the addition of allergies, arthritis, vision change,  fatigue, headaches, muscle pains, shortness of breath.   Physical Exam: General:   Alert,  well-nourished, pleasant and cooperative in NAD. No pallor. Head:  Normocephalic and atraumatic. No alopecia.  Eyes:  Sclera clear, no icterus.   Conjunctiva pink. Ears:  Normal auditory acuity. Nose:  No deformity, discharge,  or lesions. Mouth:  No deformity or lesions.  No atrophic glossitis. No cheilosis. Neck:  Supple; no masses or thyromegaly. Lungs:  Clear throughout to auscultation.   No wheezes. Heart:  Regular rate and rhythm; no murmurs. Abdomen:  Soft, nontender, nondistended, normal bowel sounds, no rebound or guarding. No hepatosplenomegaly.   Rectal:  Deferred  Msk:  Symmetrical. No boney deformities LAD: No inguinal or umbilical LAD Extremities:  No clubbing or edema. Neurologic:  Alert and  oriented x4;  grossly nonfocal Skin:  No rash or bruise. No koilonychia.  Psych:  Alert and cooperative. Normal mood and affect.      Javaya [place] L. Tarri Glenn, MD, MPH 05/12/2022, 1:50 PM

## 2022-05-14 ENCOUNTER — Encounter: Payer: Self-pay | Admitting: Gastroenterology

## 2022-05-14 ENCOUNTER — Telehealth: Payer: Self-pay

## 2022-05-14 ENCOUNTER — Ambulatory Visit (AMBULATORY_SURGERY_CENTER): Payer: BC Managed Care – PPO | Admitting: Gastroenterology

## 2022-05-14 VITALS — BP 132/51 | HR 93 | Temp 97.3°F | Resp 20 | Ht 60.0 in | Wt 131.0 lb

## 2022-05-14 DIAGNOSIS — K2980 Duodenitis without bleeding: Secondary | ICD-10-CM

## 2022-05-14 DIAGNOSIS — K31A0 Gastric intestinal metaplasia, unspecified: Secondary | ICD-10-CM

## 2022-05-14 DIAGNOSIS — K31A15 Gastric intestinal metaplasia without dysplasia, involving multiple sites: Secondary | ICD-10-CM | POA: Diagnosis not present

## 2022-05-14 DIAGNOSIS — K5520 Angiodysplasia of colon without hemorrhage: Secondary | ICD-10-CM | POA: Diagnosis not present

## 2022-05-14 DIAGNOSIS — Z800 Family history of malignant neoplasm of digestive organs: Secondary | ICD-10-CM

## 2022-05-14 DIAGNOSIS — D509 Iron deficiency anemia, unspecified: Secondary | ICD-10-CM | POA: Diagnosis present

## 2022-05-14 MED ORDER — PANTOPRAZOLE SODIUM 40 MG PO TBEC: 40.0000 mg | DELAYED_RELEASE_TABLET | Freq: Every day | ORAL | 1 refills | Status: DC

## 2022-05-14 MED ORDER — SODIUM CHLORIDE 0.9 % IV SOLN: 500.0000 mL | Freq: Once | INTRAVENOUS | Status: AC

## 2022-05-14 NOTE — Patient Instructions (Addendum)
Thank you for coming in to see [place] today. Resume your diet and medications today. Return to normal daily activities tomorrow.  Start prescription Pantoprazole 40 mg twice per day for 8 weeks to treat the inflammation and irritation in your stomach. Avoid any NSAID  Aspirin, Ibuprofen, Naproxen, or any non-steroidal anti-inflammatory medications.  Our office will contact you regarding the capsule endoscopy and subsequent hospital endoscopy.   YOU HAD AN ENDOSCOPIC PROCEDURE TODAY AT [facility]:   Refer to the procedure report that was given to you for any specific questions about what was found during the examination.  If the procedure report does not answer your questions, please call your gastroenterologist to clarify.  If you requested that your care partner not be given the details of your procedure findings, then the procedure report has been included in a sealed envelope for you to review at your convenience later.  YOU SHOULD EXPECT: Some feelings of bloating in the abdomen. Passage of more gas than usual.  Walking can help get rid of the air that was put into your GI tract during the procedure and reduce the bloating. If you had a lower endoscopy (such as a colonoscopy or flexible sigmoidoscopy) you may notice spotting of blood in your stool or on the toilet paper. If you underwent a bowel prep for your procedure, you may not have a normal bowel movement for a few days.  Please Note:  You might notice some irritation and congestion in your nose or some drainage.  This is from the oxygen used during your procedure.  There is no need for concern and it should clear up in a day or so.  SYMPTOMS TO REPORT IMMEDIATELY:  Following lower endoscopy (colonoscopy or flexible sigmoidoscopy):  Excessive amounts of blood in the stool  Significant tenderness or worsening of abdominal pains  Swelling of the abdomen that is new, acute  Fever of 100F or higher  Following upper  endoscopy (EGD)  Vomiting of blood or coffee ground material  New chest pain or pain under the shoulder blades  Painful or persistently difficult swallowing  New shortness of breath  Fever of 100F or higher  Black, tarry-looking stools  For urgent or emergent issues, a gastroenterologist can be reached at any hour by calling [phone]. Do not use MyChart messaging for urgent concerns.    DIET:  We do recommend a small meal at first, but then you may proceed to your regular diet.  Drink plenty of fluids but you should avoid alcoholic beverages for 24 hours.  ACTIVITY:  You should plan to take it easy for the rest of today and you should NOT DRIVE or use heavy machinery until tomorrow (because of the sedation medicines used during the test).    FOLLOW UP: Our staff will call the number listed on your records the next business day following your procedure.  We will call around 7:15- 8:00 am to check on you and address any questions or concerns that you may have regarding the information given to you following your procedure. If we do not reach you, we will leave a message.  If you develop any symptoms (ie: fever, flu-like symptoms, shortness of breath, cough etc.) before then, please call [phone].  If you test positive for Covid 19 in the 2 weeks post procedure, please call and report this information to [place].    If any biopsies were taken you will be contacted by phone or by letter within the next  1-3 weeks.  Please call us at [phone] if you have not heard about the biopsies in 3 weeks.    SIGNATURES/CONFIDENTIALITY: You and/or your care partner have signed paperwork which will be entered into your electronic medical record.  These signatures attest to the fact that that the information above on your After Visit Summary has been reviewed and is understood.  Full responsibility of the confidentiality of this discharge information lies with you and/or your care-partner.

## 2022-05-14 NOTE — Progress Notes (Signed)
Indications for EGD and colonoscopy : Iron deficiency anemia  Please see my 05/12/22 office note for complete details. There has been no change in history or physical exam. She remains an appropriate candidate for monitored anesthesia care in the [facility].

## 2022-05-14 NOTE — Telephone Encounter (Signed)
-----   Message from Thornton Park, MD sent at 05/14/2022 10:31 AM EDT ----- Please arrange for:  (1) Capsule endoscopy for iron deficiency anemia, evaluate for small bowel AVMs (2) Colonoscopy with APC during my next hospital week (and after capsule study)  Thank you.  KLB

## 2022-05-14 NOTE — Progress Notes (Signed)
Called to room to assist during endoscopic procedure.  Patient ID and intended procedure confirmed with present staff. Received instructions for my participation in the procedure from the performing physician.  

## 2022-05-14 NOTE — Op Note (Signed)
[facility] Patient Name: Nicole Marquez Procedure Date: 05/14/2022 9:58 AM MRN: [id number] Endoscopist: Thornton Park MD, MD Age: 61 Referring MD:  Date of Birth: [birth year] Gender: Female Account #: [id number] Procedure:                Colonoscopy Indications:              Unexplained iron deficiency anemia                           last colonoscopy 2011 in [place]:                Monitored Anesthesia Care Procedure:                Pre-Anesthesia Assessment:                           - Prior to the procedure, a History and Physical                            was performed, and patient medications and                            allergies were reviewed. The patient's tolerance of                            previous anesthesia was also reviewed. The risks                            and benefits of the procedure and the sedation                            options and risks were discussed with the patient.                            All questions were answered, and informed consent                            was obtained. Prior Anticoagulants: The patient has                            taken no previous anticoagulant or antiplatelet                            agents. ASA Grade Assessment: II - A patient with                            mild systemic disease. After reviewing the risks                            and benefits, the patient was deemed in                            satisfactory condition to undergo the procedure.  After obtaining informed consent, the colonoscope                            was passed under direct vision. Throughout the                            procedure, the patient's blood pressure, pulse, and                            oxygen saturations were monitored continuously. The                            Olympus CF-HQ190L ([id number]) Colonoscope was                            introduced through the anus and advanced  to the 3                            cm into the ileum. A second forward view of the                            right colon was performed. The colonoscopy was                            performed without difficulty. The patient tolerated                            the procedure well. The quality of the bowel                            preparation was good. The terminal ileum, ileocecal                            valve, appendiceal orifice, and rectum were                            photographed. Scope In: 10:16:33 AM Scope Out: 10:30:13 AM Scope Withdrawal Time: 0 hours 8 minutes 57 seconds  Total Procedure Duration: 0 hours 13 minutes 40 seconds  Findings:                 The perianal and digital rectal examinations were                            normal.                           Multiple medium-sized localized angiodysplastic                            lesions without bleeding were found in the proximal                            ascending colon and in the cecum.  The exam was otherwise without abnormality on                            direct and retroflexion views. Complications:            No immediate complications. Estimated Blood Loss:     Estimated blood loss: none. Impression:               - Multiple non-bleeding colonic angiodysplastic                            lesions. These are the likely source of anemia.                           - The examination was otherwise normal on direct                            and retroflexion views.                           - No specimens collected. Recommendation:           - Patient has a contact number available for                            emergencies. The signs and symptoms of potential                            delayed complications were discussed with the                            patient. Return to normal activities tomorrow.                            Written discharge instructions were provided to the                             patient.                           - Resume previous diet.                           - Continue present medications.                           - Repeat colonoscopy at the next available                            appointment at the hospital for treatment with APC.                           - Will attempt to arrange for capsule endoscopy                            prior to that time to evaluate for concurrent small  bowel AVMs.                           - Awaiting consultation with hematology for IV iron.                           - Emerging evidence supports eating a diet of                            fruits, vegetables, grains, calcium, and yogurt                            while reducing red meat and alcohol may reduce the                            risk of colon cancer. Thornton Park MD, MD 05/14/2022 10:38:32 AM This report has been signed electronically.

## 2022-05-14 NOTE — Progress Notes (Signed)
Pt in recovery with monitors in place, VSS. Report given to receiving RN. Bite guard was placed with pt awake to ensure comfort. No dental or soft tissue damage noted. 

## 2022-05-14 NOTE — Progress Notes (Signed)
Patient states she fell this am and hit her head. Dr. Tarri Glenn and Kassie Mends CRNA came out to talk to patient. Ok to proceed with procedure

## 2022-05-14 NOTE — Op Note (Signed)
[address] Patient Name: Nicole Marquez Procedure Date: 05/14/2022 9:58 AM MRN: [id number] Endoscopist: Thornton Park MD, MD Age: 61 Referring MD:  Date of Birth: [birth year] Gender: Female Account #: [id number] Procedure:                Upper GI endoscopy Indications:              Unexplained iron deficiency anemia Medicines:                Monitored Anesthesia Care Procedure:                Pre-Anesthesia Assessment:                           - Prior to the procedure, a History and Physical                            was performed, and patient medications and                            allergies were reviewed. The patient's tolerance of                            previous anesthesia was also reviewed. The risks                            and benefits of the procedure and the sedation                            options and risks were discussed with the patient.                            All questions were answered, and informed consent                            was obtained. Prior Anticoagulants: The patient has                            taken no previous anticoagulant or antiplatelet                            agents. ASA Grade Assessment: II - A patient with                            mild systemic disease. After reviewing the risks                            and benefits, the patient was deemed in                            satisfactory condition to undergo the procedure.                           After obtaining informed consent, the endoscope was  passed under direct vision. Throughout the                            procedure, the patient's blood pressure, pulse, and                            oxygen saturations were monitored continuously. The                            GIF [id number] #[id number] was introduced through the                            mouth, and advanced to the second part of duodenum.                            The upper GI  endoscopy was accomplished without                            difficulty. The patient tolerated the procedure                            well. Scope In: Scope Out: Findings:                 The esophagus was normal.                           Multiple localized small erosions with no bleeding                            and no stigmata of recent bleeding were found in                            the gastric antrum. Biopsies were taken from the                            antrum, body, and fundus with a cold forceps for                            histology. Estimated blood loss was minimal.                           The examined duodenum was normal. Biopsies were                            taken with a cold forceps for histology. Estimated                            blood loss was minimal.                           The cardia and gastric fundus were normal on                            retroflexion except for  a small hiatal hernia.                           The exam was otherwise without abnormality. Complications:            No immediate complications. Estimated Blood Loss:     Estimated blood loss was minimal. Impression:               - Normal esophagus.                           - Erosive gastropathy with no bleeding and no                            stigmata of recent bleeding. Biopsied.                           - Small hiatal hernia.                           - Normal examined duodenum. Biopsied.                           - The examination was otherwise normal.                           - These findings are unlikely to explain the                            magnitude of iron deficiency identified. Recommendation:           - Patient has a contact number available for                            emergencies. The signs and symptoms of potential                            delayed complications were discussed with the                            patient. Return to normal activities  tomorrow.                            Written discharge instructions were provided to the                            patient.                           - Resume previous diet.                           - Continue present medications.                           - Start pantoprazole 40 mg BID x 8 weeks.                           -  No aspirin, ibuprofen, naproxen, or other                            non-steroidal anti-inflammatory drugs.                           - Await pathology results.                           - Proceed with colonoscopy as planned. Thornton Park MD, MD 05/14/2022 10:15:26 AM This report has been signed electronically.

## 2022-05-18 ENCOUNTER — Telehealth: Payer: Self-pay | Admitting: *Deleted

## 2022-05-18 ENCOUNTER — Other Ambulatory Visit: Payer: Self-pay

## 2022-05-18 ENCOUNTER — Inpatient Hospital Stay: Payer: BC Managed Care – PPO

## 2022-05-18 ENCOUNTER — Encounter: Payer: Self-pay | Admitting: Oncology

## 2022-05-18 ENCOUNTER — Inpatient Hospital Stay: Payer: BC Managed Care – PPO | Attending: Oncology | Admitting: Oncology

## 2022-05-18 DIAGNOSIS — D0511 Intraductal carcinoma in situ of right breast: Secondary | ICD-10-CM | POA: Diagnosis present

## 2022-05-18 DIAGNOSIS — D649 Anemia, unspecified: Secondary | ICD-10-CM | POA: Diagnosis not present

## 2022-05-18 DIAGNOSIS — Z79899 Other long term (current) drug therapy: Secondary | ICD-10-CM | POA: Insufficient documentation

## 2022-05-18 DIAGNOSIS — K5521 Angiodysplasia of colon with hemorrhage: Secondary | ICD-10-CM

## 2022-05-18 DIAGNOSIS — Z170 Estrogen receptor positive status [ER+]: Secondary | ICD-10-CM | POA: Diagnosis not present

## 2022-05-18 DIAGNOSIS — K2970 Gastritis, unspecified, without bleeding: Secondary | ICD-10-CM | POA: Insufficient documentation

## 2022-05-18 DIAGNOSIS — D500 Iron deficiency anemia secondary to blood loss (chronic): Secondary | ICD-10-CM

## 2022-05-18 DIAGNOSIS — K5520 Angiodysplasia of colon without hemorrhage: Secondary | ICD-10-CM | POA: Insufficient documentation

## 2022-05-18 DIAGNOSIS — D509 Iron deficiency anemia, unspecified: Secondary | ICD-10-CM | POA: Insufficient documentation

## 2022-05-18 DIAGNOSIS — Z87891 Personal history of nicotine dependence: Secondary | ICD-10-CM | POA: Insufficient documentation

## 2022-05-18 DIAGNOSIS — K2901 Acute gastritis with bleeding: Secondary | ICD-10-CM | POA: Diagnosis not present

## 2022-05-18 LAB — APTT: aPTT: 25 seconds (ref 24–36)

## 2022-05-18 LAB — VITAMIN B12: Vitamin B-12: 342 pg/mL (ref 180–914)

## 2022-05-18 LAB — PROTIME-INR
INR: 0.9 (ref 0.8–1.2)
Prothrombin Time: 12.4 seconds (ref 11.4–15.2)

## 2022-05-18 LAB — FOLATE: Folate: 14.3 ng/mL (ref >5.9)

## 2022-05-18 NOTE — Telephone Encounter (Signed)
  Follow up Call-     05/14/2022    9:01 AM  Call back number  Post procedure Call Back phone  # [phone]  Permission to leave phone message Yes     Patient questions:  Do you have a fever, pain , or abdominal swelling? No. Pain Score  0 *  Have you tolerated food without any problems? Yes.    Have you been able to return to your normal activities? Yes.    Do you have any questions about your discharge instructions: Diet   No. Medications  Yes.   Follow up visit  No.  Do you have questions or concerns about your Care? No.  Actions: * If pain score is 4 or above: No action needed, pain <4.

## 2022-05-18 NOTE — Telephone Encounter (Signed)
Patient returned your call, please advise. 

## 2022-05-18 NOTE — Telephone Encounter (Signed)
Left message for patient to call back  

## 2022-05-18 NOTE — Progress Notes (Signed)
[facility] Initial Visit:  Patient Care Team: Rochel Brome, MD as PCP - General (Family Medicine) Mauro Kaufmann, RN as Oncology Nurse Navigator Rockwell Germany, RN as Oncology Nurse Navigator [name], MD as Consulting Physician (General Surgery) Nicholas Lose, MD as Consulting Physician (Hematology and Oncology) Kyung Rudd, MD as Consulting Physician (Radiation Oncology)  CHIEF COMPLAINTS/PURPOSE OF CONSULTATION:   Oncology History  Ductal carcinoma in situ (DCIS) of right breast  02/19/2021 Initial Diagnosis   Screening mammogram on 02/02/2021 showed calcification in the right breast. Diagnostic mammogram and [place] on 02/13/2021 showed 3 cm group of suspicious inner lower right breast calcifications. 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    02/25/2021 Cancer Staging   Staging form: Breast, AJCC 8th Edition - Clinical stage from 02/25/2021: Stage 0 (cTis (DCIS), cN0, cM0, ER+, PR-) - Signed by Nicholas Lose, MD on 02/25/2021 Stage prefix: Initial diagnosis Nuclear grade: [place]   05/28/2021 Surgery   Right lumpectomy: high grade DCIS 3.2 cm with necrosis and atypical lobular hyperplasia, ER 60%, PR negative     HISTORY OF PRESENTING ILLNESS: Nicole Marquez 61 y.o. female is here because of  anemia  April 22, 2022: WBC 5.7 hemoglobin 9.9 MCV 69 platelet 247; 50 segs 37 lymphs 9 monos 2 eos 2 basophils.  Ferritin 7.  CMP notable for Cr 0.69  May 14, 2022: EGD demonstrated erosive gastropathy.  Patient placed on Protonix 40 mg twice daily.  Colonoscopy demonstrated angiodysplasia  May 18 2022:  [facility] Hematology Consult  Patient states that she was noted to be anemic on CBC with diff drawn as part of yearly physical performed in August 2023.  In retrospect she states that she felt fatigued, dizzy, light headed.   No hematochezia, melena. Ice pica for  1 month. Was placed on oral iron last month which was stopped around the time she underwent endoscopy.  Tolerated iron daily Was placed on oral iron for menorrhagia in 2014 and received IV iron without rxn.  No history of blood transfusion No history of hemorrhage. No hematochezia or melena Had syncopal episode while doing colonoscopy prep.    [facility] notable for colon cancer in brother.  Patient was evaluated for possible genetic testing, due to her history of DCIS but this was recommended against  To undergo camera endoscopy in the near futur  Review of Systems  Constitutional:  Positive for fatigue. Negative for fever.       Appetite a bit off recently Mild chills.  Intolerant to cold Has gained weight  HENT:   Positive for sore throat. Negative for mouth sores, nosebleeds and trouble swallowing.        Chronic sore throat  Eyes:  Positive for eye problems. Negative for icterus.       Has wet macular degeneration  Respiratory:  Negative for chest tightness, cough and hemoptysis.        DOE walking up stairs  Cardiovascular:  Negative for chest pain, leg swelling and palpitations.  Gastrointestinal:  Negative for abdominal pain, blood in stool, constipation and diarrhea.       Intermittent cramps RLQ  Genitourinary:  Negative for difficulty urinating, dysuria and frequency.   Musculoskeletal:  Negative for arthralgias, back pain and myalgias.  Hematological:  Negative for adenopathy. Bruises/bleeds easily.  Psychiatric/Behavioral:  Negative for sleep disturbance and suicidal ideas. The patient is not nervous/anxious.  MEDICAL HISTORY: Past Medical History:  Diagnosis Date   Anemia    Asthma    Breast cancer ([place]) 2022   DCIS (ductal carcinoma in situ)    Exudative age-related macular degeneration, bilateral, stage unspecified ([place])    Family history of colon cancer 02/27/2021   Family history of malignant neoplasm of digestive organs    Mixed hyperlipidemia    Other  specified hypothyroidism    Type 1 diabetes mellitus with hypoglycemia without coma ([place])     SURGICAL HISTORY: Past Surgical History:  Procedure Laterality Date   BREAST LUMPECTOMY WITH RADIOACTIVE SEED LOCALIZATION Right 05/28/2021   Procedure: RIGHT BREAST LUMPECTOMY WITH RADIOACTIVE SEED LOCALIZATION X2;  Surgeon: [name], MD;  Location: [place];  Service: General;  Laterality: Right;   CESAREAN SECTION     x3   ENDOMETRIAL ABLATION     Right rotator cuff repair      SOCIAL HISTORY: Social History   Socioeconomic History   Marital status: Married    Spouse name: Gwyndolyn Saxon   Number of children: 4   Years of education: 12   Highest education level: 12th grade  Occupational History   Not on file  Tobacco Use   Smoking status: Former    Packs/day: 1.00    Years: 30.00    Total pack years: 30.00    Types: Cigarettes    Quit date: 2008    Years since quitting: 15.6   Smokeless tobacco: Never  Vaping Use   Vaping Use: Never used  Substance and Sexual Activity   Alcohol use: Never   Drug use: Never   Sexual activity: Not Currently  Other Topics Concern   Not on file  Social History Narrative   Patient has 1 set of twins and two individual children.   Social Determinants of Health   Financial Resource Strain: Not on file  Food Insecurity: Not on file  Transportation Needs: Not on file  Physical Activity: Not on file  Stress: Not on file  Social Connections: Not on file  Intimate Partner Violence: Not on file    FAMILY HISTORY Family History  Problem Relation Age of Onset   Cancer - Other Mother 76       carcinoid of colon   Diabetes Father    Colon cancer Brother 44   Lung cancer Paternal Aunt        dx >50; smoking hx   Cancer - Other Maternal Grandfather 100       carcinoid of colon   Esophageal cancer Neg Hx     ALLERGIES:  is allergic to ciprofloxacin and lipitor [atorvastatin].  MEDICATIONS:  Current Outpatient Medications  Medication Sig  Dispense Refill   albuterol (VENTOLIN HFA) 108 (90 Base) MCG/ACT inhaler INHALE 1 TO 2 PUFFS EVERY 4 HOURS AS NEEDED FOR WHEEZING 18 each 2   Budesonide (PULMICORT FLEXHALER) 90 MCG/ACT inhaler INHALE 1 PUFF INTO THE LUNGS TWICE A DAY 1 each 2   Continuous Blood Gluc Sensor (DEXCOM G6 SENSOR) MISC Check sugars qac and qhs. 3 each 0   Continuous Blood Gluc Transmit (DEXCOM G6 TRANSMITTER) MISC Change transmitter every 3 months DX CODE:  E10.649 1 each 4   insulin degludec (TRESIBA FLEXTOUCH) 200 UNIT/ML FlexTouch Pen ADMINISTER 28 UNITS UNDER THE SKIN DAILY 3 mL 1   Insulin Pen Needle (BD PEN NEEDLE NANO 2ND GEN) 32G X 4 MM MISC 1 EACH BY DOES NOT APPLY ROUTE 4 (FOUR) TIMES DAILY. 200 each 1   levothyroxine (  SYNTHROID) 75 MCG tablet Take 1 tablet (75 mcg total) by mouth daily before breakfast. 30 tablet 1   montelukast (SINGULAIR) 10 MG tablet TAKE 1 TABLET BY MOUTH EVERYDAY AT BEDTIME 90 tablet 0   pantoprazole (PROTONIX) 40 MG tablet Take 1 tablet (40 mg total) by mouth daily. 60 tablet 1   ramipril (ALTACE) 10 MG capsule Take 1 capsule (10 mg total) by mouth daily. 90 capsule 1   Current Facility-Administered Medications  Medication Dose Route Frequency Provider Last Rate Last Admin   0.9 %  sodium chloride infusion  500 mL Intravenous Once Thornton Park, MD        PHYSICAL EXAMINATION:  ECOG PERFORMANCE STATUS: 1 - Symptomatic but completely ambulatory   Vitals:   05/18/22 1322  BP: 120/66  Pulse: 87  Resp: 18  Temp: 98.4 F (36.9 C)  SpO2: 100%    Filed Weights   05/18/22 1322  Weight: 131 lb 8 oz (59.6 kg)     Physical Exam Vitals and nursing note reviewed.  Constitutional:      General: She is not in acute distress.    Appearance: Normal appearance. She is normal weight. She is not ill-appearing, toxic-appearing or diaphoretic.     Comments: Here with husband  HENT:     Head: Normocephalic and atraumatic.     Right Ear: External ear normal.     Left Ear:  External ear normal.     Nose: Nose normal. No congestion or rhinorrhea.     Mouth/Throat:     Mouth: Mucous membranes are moist.     Pharynx: Oropharynx is clear.  Eyes:     General: No scleral icterus.    Extraocular Movements: Extraocular movements intact.     Conjunctiva/sclera: Conjunctivae normal.     Pupils: Pupils are equal, round, and reactive to light.  Cardiovascular:     Rate and Rhythm: Normal rate and regular rhythm.     Heart sounds: Normal heart sounds. No murmur heard.    No friction rub. No gallop.  Pulmonary:     Effort: No respiratory distress.     Breath sounds: No stridor. No wheezing or rhonchi.  Abdominal:     General: Bowel sounds are normal. There is no distension.     Palpations: Abdomen is soft. There is no mass.     Tenderness: There is no abdominal tenderness. There is no guarding or rebound.     Hernia: No hernia is present.  Musculoskeletal:        General: No swelling, tenderness or deformity.     Cervical back: Normal range of motion and neck supple. No rigidity or tenderness.     Right lower leg: No edema.     Left lower leg: No edema.  Lymphadenopathy:     Head:     Right side of head: No submental, submandibular, tonsillar, preauricular, posterior auricular or occipital adenopathy.     Left side of head: No submental, submandibular, tonsillar, preauricular, posterior auricular or occipital adenopathy.     Cervical: No cervical adenopathy.     Right cervical: No superficial, deep or posterior cervical adenopathy.    Left cervical: No superficial, deep or posterior cervical adenopathy.     Upper Body:     Right upper body: No supraclavicular, axillary, pectoral or epitrochlear adenopathy.     Left upper body: No supraclavicular, axillary, pectoral or epitrochlear adenopathy.  Skin:    General: Skin is warm.     Coloration: Skin is  not jaundiced or pale.     Findings: No bruising, erythema, lesion or rash.     Comments: No telangectasias on  skin or mucus membranes   Neurological:     General: No focal deficit present.     Mental Status: She is alert and oriented to person, place, and time. Mental status is at baseline.     Cranial Nerves: No cranial nerve deficit.     Sensory: No sensory deficit.     Motor: No weakness.     Coordination: Coordination normal.  Psychiatric:        Mood and Affect: Mood normal.        Behavior: Behavior normal.        Thought Content: Thought content normal.        Judgment: Judgment normal.      LABORATORY DATA: I have personally reviewed the data as listed:  Appointment on 05/12/2022  Component Date Value Ref Range Status   WBC 05/12/2022 5.7  4.0 - 10.5 K/uL Final   RBC 05/12/2022 4.68  3.87 - 5.11 Mil/uL Final   Hemoglobin 05/12/2022 9.9 (L)  12.0 - 15.0 g/dL Final   HCT 05/12/2022 32.3 (L)  36.0 - 46.0 % Final   MCV 05/12/2022 69.0 Repeated and verified X2. (L)  78.0 - 100.0 fl Final   MCHC 05/12/2022 30.7  30.0 - 36.0 g/dL Final   RDW 05/12/2022 19.0 (H)  11.5 - 15.5 % Final   Platelets 05/12/2022 247.0  150.0 - 400.0 K/uL Final   Neutrophils Relative % 05/12/2022 64.5  43.0 - 77.0 % Final   Lymphocytes Relative 05/12/2022 26.9  12.0 - 46.0 % Final   Monocytes Relative 05/12/2022 5.9  3.0 - 12.0 % Final   Eosinophils Relative 05/12/2022 1.4  0.0 - 5.0 % Final   Basophils Relative 05/12/2022 1.3  0.0 - 3.0 % Final   Neutro Abs 05/12/2022 3.7  1.4 - 7.7 K/uL Final   Lymphs Abs 05/12/2022 1.5  0.7 - 4.0 K/uL Final   Monocytes Absolute 05/12/2022 0.3  0.1 - 1.0 K/uL Final   Eosinophils Absolute 05/12/2022 0.1  0.0 - 0.7 K/uL Final   Basophils Absolute 05/12/2022 0.1  0.0 - 0.1 K/uL Final  Office Visit on 04/22/2022  Component Date Value Ref Range Status   WBC 04/22/2022 5.4  3.4 - 10.8 x10E3/uL Final   RBC 04/22/2022 4.54  3.77 - 5.28 x10E6/uL Final   Hemoglobin 04/22/2022 9.2 (L)  11.1 - 15.9 g/dL Final   Hematocrit 04/22/2022 31.8 (L)  34.0 - 46.6 % Final   MCV 04/22/2022  70 (L)  79 - 97 fL Final   MCH 04/22/2022 20.3 (L)  26.6 - 33.0 pg Final   MCHC 04/22/2022 28.9 (L)  31.5 - 35.7 g/dL Final   RDW 04/22/2022 14.1  11.7 - 15.4 % Final   Platelets 04/22/2022 325  150 - 450 x10E3/uL Final   Neutrophils 04/22/2022 50  Not Estab. % Final   Lymphs 04/22/2022 37  Not Estab. % Final   Monocytes 04/22/2022 9  Not Estab. % Final   Eos 04/22/2022 2  Not Estab. % Final   Basos 04/22/2022 2  Not Estab. % Final   Neutrophils Absolute 04/22/2022 2.7  1.4 - 7.0 x10E3/uL Final   Lymphocytes Absolute 04/22/2022 2.0  0.7 - 3.1 x10E3/uL Final   Monocytes Absolute 04/22/2022 0.5  0.1 - 0.9 x10E3/uL Final   EOS (ABSOLUTE) 04/22/2022 0.1  0.0 - 0.4 x10E3/uL Final   Basophils  Absolute 04/22/2022 0.1  0.0 - 0.2 x10E3/uL Final   Immature Granulocytes 04/22/2022 0  Not Estab. % Final   Immature Grans (Abs) 04/22/2022 0.0  0.0 - 0.1 x10E3/uL Final   Glucose 04/22/2022 106 (H)  70 - 99 mg/dL Final   BUN 04/22/2022 16  8 - 27 mg/dL Final   Creatinine, Ser 04/22/2022 0.69  0.57 - 1.00 mg/dL Final   eGFR 04/22/2022 97  >59 mL/min/1.73 Final   BUN/Creatinine Ratio 04/22/2022 23  12 - 28 Final   Sodium 04/22/2022 141  134 - 144 mmol/L Final   Potassium 04/22/2022 4.6  3.5 - 5.2 mmol/L Final   Chloride 04/22/2022 103  96 - 106 mmol/L Final   CO2 04/22/2022 24  20 - 29 mmol/L Final   Calcium 04/22/2022 9.8  8.7 - 10.3 mg/dL Final   Total Protein 04/22/2022 6.4  6.0 - 8.5 g/dL Final   Albumin 04/22/2022 4.6  3.9 - 4.9 g/dL Final   Globulin, Total 04/22/2022 1.8  1.5 - 4.5 g/dL Final   Albumin/Globulin Ratio 04/22/2022 2.6 (H)  1.2 - 2.2 Final   Bilirubin Total 04/22/2022 0.3  0.0 - 1.2 mg/dL Final   Alkaline Phosphatase 04/22/2022 83  44 - 121 IU/L Final   AST 04/22/2022 16  0 - 40 IU/L Final   ALT 04/22/2022 14  0 - 32 IU/L Final   Cholesterol, Total 04/22/2022 246 (H)  100 - 199 mg/dL Final   Triglycerides 04/22/2022 79  0 - 149 mg/dL Final   HDL 04/22/2022 60  >39 mg/dL Final    VLDL Cholesterol Cal 04/22/2022 13  5 - 40 mg/dL Final   LDL Chol Calc (NIH) 04/22/2022 173 (H)  0 - 99 mg/dL Final   Chol/HDL Ratio 04/22/2022 4.1  0.0 - 4.4 ratio Final   Comment:                                   T. Chol/HDL Ratio                                             Men  Women                               1/2 Avg.Risk  3.4    3.3                                   Avg.Risk  5.0    4.4                                2X Avg.Risk  9.6    7.1                                3X Avg.Risk 23.4   11.0    Free T4 04/22/2022 2.08 (H)  0.82 - 1.77 ng/dL Final   TSH 04/22/2022 0.108 (L)  0.450 - 4.500 uIU/mL Final   Creatinine, Urine 04/22/2022 131.5  Not Estab. mg/dL Final   Microalbumin, Urine 04/22/2022 15.1  Not Estab. ug/mL Final  Microalb/Creat Ratio 04/22/2022 11  0 - 29 mg/g creat Final   Comment:                        Normal:                0 -  29                        Moderately increased: 30 - 300                        Severely increased:       >300    Interpretation 04/22/2022 Note   Final   Supplemental report is available.   Total Iron Binding Capacity 04/22/2022 426  250 - 450 ug/dL Final   UIBC 04/22/2022 408 (H)  118 - 369 ug/dL Final   Iron 04/22/2022 18 (L)  27 - 139 ug/dL Final   Iron Saturation 04/22/2022 4 (LL)  15 - 55 % Final   Hgb A1c MFr Bld 04/22/2022 7.2 (H)  4.8 - 5.6 % Final   Comment:          Prediabetes: 5.7 - 6.4          Diabetes: >6.4          Glycemic control for adults with diabetes: <7.0    Ferritin 04/22/2022 7 (L)  15 - 150 ng/mL Final   specimen status report 04/22/2022 Comment   Final   Comment: Written Authorization Written Authorization Written Authorization Received. Authorization received from [place] MD 04-28-2022 Logged by Denita Lung     RADIOGRAPHIC STUDIES: I have personally reviewed the radiological images as listed and agree with the findings in the report  No results found.  ASSESSMENT/PLAN  61 year old  female with history of DCIS diagnosed in June 2022 who presented in August 2023 with symptomatic anemia  Anemia:  Secondary to iron deficiency from GI blood loss.  Will check B12, folate and arrange for IV iron.    Gastritis:  Awaiting results of H pylori testing.  Currently on protonix 40 mg po bid  Angiodysplasia of GIT:   Abnormal, tortuous, dilated small blood vessel in the mucosal and submucosal layers of the GI tract.   It is the most common vascular abnormality in the GI tract. Diagnosed mainly by endoscopy and angiography.  Can be congenital such as hereditary hemorrhagic telangiectasia (HHT also known as Osler Weber Rendue Disease).  It is more often acquired and associated with ESRD, Von Willebrand disease, left ventricular assist device (LVAD), and aortic stenosis (Heyde syndrome), Systemic sclerosis (SSc)  Colonic angiodysplasia occur mainly in patients > 20 yrs of age, prevalence increases with age. No gender predominance.  Frequent cause of recurrent/chronic LGIB in patients > 48 yoa.  Amost always confined to cecum or ascending colon.    10% of such patients may have similar lesions in small bowel.   Patients may have no symptoms or present with GI bleeding.  Small bowel angiodysplasia is the most common cause of obscure GI bleeding (OGIB) in those > 61 years old  No evidence of ESRD.  Will check coags and von Willebrand panel.  Can hold on testing for HHT since no physical stigmata or other history to suggest.    History of DCIS:  Managed by [facility] at [place] Staging  Ductal carcinoma in situ (DCIS)  of right breast Staging form: Breast, AJCC 8th Edition - Clinical stage from 02/25/2021: Stage 0 (cTis (DCIS), cN0, cM0, ER+, PR-) - Signed by Nicholas Lose, MD on 02/25/2021 Stage prefix: Initial diagnosis Nuclear grade: GX    No problem-specific Assessment & Plan notes found for this encounter.   Orders Placed This Encounter  Procedures    Folate    Standing Status:   Future    Standing Expiration Date:   05/19/2023   Vitamin B12    Standing Status:   Future    Standing Expiration Date:   05/19/2023   PT and PTT    Standing Status:   Future    Standing Expiration Date:   05/19/2023   Von Willebrand panel    Standing Status:   Future    Standing Expiration Date:   05/19/2023    All questions were answered. The patient knows to call the clinic with any problems, questions or concerns.  This note was electronically signed.    Barbee Cough, MD  05/18/2022 2:07 PM

## 2022-05-19 ENCOUNTER — Other Ambulatory Visit: Payer: Self-pay

## 2022-05-19 DIAGNOSIS — K5520 Angiodysplasia of colon without hemorrhage: Secondary | ICD-10-CM

## 2022-05-19 DIAGNOSIS — D509 Iron deficiency anemia, unspecified: Secondary | ICD-10-CM

## 2022-05-19 LAB — VON WILLEBRAND PANEL
Coagulation Factor VIII: 220 % — ABNORMAL HIGH (ref 56–140)
Ristocetin Co-factor, Plasma: 161 % (ref 50–200)
Von Willebrand Antigen, Plasma: 180 % (ref 50–200)

## 2022-05-19 LAB — COAG STUDIES INTERP REPORT

## 2022-05-19 MED ORDER — PLENVU 140 G PO SOLR
1.0000 | ORAL | 0 refills | Status: DC
Start: 2022-05-19 — End: 2022-09-03

## 2022-05-19 NOTE — Telephone Encounter (Signed)
Patient has been scheduled for capsule endoscopy on 05/26/22 at 8:30 am & colon with APC treatment on 07/19/22 at 8:15 am at [facility] with Dr. Tarri Glenn. Amb ref for both procedures & hospital orders placed. Instructions for both procedures sent to mychart & prep sent to pharmacy. Patient is diabetic, instructions have been included with prep. No blood thinners. Patient has been advised to call back with any further questions or concerns. Patient verbalized all understanding.

## 2022-05-19 NOTE — Telephone Encounter (Signed)
Patients husband called stating that he would like to speak with you about a medication mix up. Please advise.

## 2022-05-20 ENCOUNTER — Other Ambulatory Visit: Payer: Self-pay | Admitting: Pharmacist

## 2022-05-21 ENCOUNTER — Telehealth: Payer: Self-pay | Admitting: Oncology

## 2022-05-21 NOTE — Telephone Encounter (Signed)
Contacted pt to schedule an IV iron appts and follow up visit with Dr. Federico Flake. Unable to reach via phone, voicemail was left.

## 2022-05-24 ENCOUNTER — Encounter: Payer: Self-pay | Admitting: Family Medicine

## 2022-05-24 ENCOUNTER — Telehealth: Payer: Self-pay | Admitting: Gastroenterology

## 2022-05-24 NOTE — Telephone Encounter (Signed)
Inbound call from patient stating he is returning a call? Please give patient a call to further advise.  Thank you

## 2022-05-25 ENCOUNTER — Telehealth: Payer: Self-pay

## 2022-05-25 ENCOUNTER — Other Ambulatory Visit: Payer: Self-pay | Admitting: Pharmacist

## 2022-05-25 ENCOUNTER — Telehealth: Payer: Self-pay | Admitting: Gastroenterology

## 2022-05-25 NOTE — Telephone Encounter (Signed)
Patient is scheduled for capsule endoscopy on Wednesday 05/26/22. Insurance has not approved the procedure as of yet and does not give indication on when the decision will be made. The patient may want to reschedule to next week to allow time or she may prep unnecessarily.  Left the patient a message to return the call to discuss what she would prefer to do.

## 2022-05-25 NOTE — Telephone Encounter (Signed)
Chart reviewed: Pt is scheduled for a Capsule Endoscopy for tomorrow: Test has not be authorized by her insurance company: Left message for pt to call back and My chart message sent to pt:

## 2022-05-25 NOTE — Telephone Encounter (Signed)
See additional phone note. 

## 2022-05-25 NOTE — Telephone Encounter (Signed)
Spoke with pt: Documented in different phone note: Pt verbalized understanding with all questions answered.   

## 2022-05-25 NOTE — Telephone Encounter (Signed)
Please see note below. 

## 2022-05-25 NOTE — Telephone Encounter (Signed)
Pt was notified that our office received a fax today stating that her insurance company has denied benefits to cover the Capsule Endoscopy that was previously ordered and scheduled for 05/26/2022: Pt was made aware:  A copy of the letter was made and original was sent to be scanned into pt chart:  Capsule endoscopy cancelled for tomorrow AM: Pt made aware Pt verbalized understanding with all questions answered.

## 2022-05-25 NOTE — Telephone Encounter (Signed)
Patient called, states she would like to speak to you as soon as possible. Please call to advise.

## 2022-05-27 MED FILL — Ferumoxytol Inj 510 MG/17ML (30 MG/ML) (Elemental Fe): INTRAVENOUS | Qty: 17 | Status: AC

## 2022-05-28 ENCOUNTER — Inpatient Hospital Stay: Payer: BC Managed Care – PPO

## 2022-05-28 VITALS — BP 150/66 | HR 78 | Temp 98.2°F | Resp 18 | Wt 131.5 lb

## 2022-05-28 DIAGNOSIS — K2901 Acute gastritis with bleeding: Secondary | ICD-10-CM

## 2022-05-28 DIAGNOSIS — D500 Iron deficiency anemia secondary to blood loss (chronic): Secondary | ICD-10-CM

## 2022-05-28 DIAGNOSIS — D0511 Intraductal carcinoma in situ of right breast: Secondary | ICD-10-CM | POA: Diagnosis not present

## 2022-05-28 DIAGNOSIS — K5521 Angiodysplasia of colon with hemorrhage: Secondary | ICD-10-CM

## 2022-05-28 MED ORDER — SODIUM CHLORIDE 0.9 % IV SOLN: Freq: Once | INTRAVENOUS | Status: AC

## 2022-05-28 MED ORDER — FAMOTIDINE IN NACL 20-0.9 MG/50ML-% IV SOLN
20.0000 mg | Freq: Once | INTRAVENOUS | Status: AC
  Administered 2022-05-28: 20 mg via INTRAVENOUS
  Filled 2022-05-28: qty 50

## 2022-05-28 MED ORDER — SODIUM CHLORIDE 0.9 % IV SOLN
510.0000 mg | Freq: Once | INTRAVENOUS | Status: AC
  Administered 2022-05-28: 510 mg via INTRAVENOUS
  Filled 2022-05-28: qty 510

## 2022-05-28 MED ORDER — ACETAMINOPHEN 325 MG PO TABS
650.0000 mg | ORAL_TABLET | Freq: Once | ORAL | Status: AC
  Administered 2022-05-28: 650 mg via ORAL
  Filled 2022-05-28: qty 2

## 2022-05-28 NOTE — Patient Instructions (Signed)

## 2022-05-28 NOTE — Progress Notes (Signed)
Patient waited 30 minute post iron observation with no issues. VSS upon discharge

## 2022-05-31 ENCOUNTER — Other Ambulatory Visit: Payer: Self-pay | Admitting: Nurse Practitioner

## 2022-06-02 ENCOUNTER — Other Ambulatory Visit: Payer: Self-pay | Admitting: Pharmacist

## 2022-06-02 ENCOUNTER — Ambulatory Visit: Payer: BC Managed Care – PPO

## 2022-06-02 MED FILL — Ferumoxytol Inj 510 MG/17ML (30 MG/ML) (Elemental Fe): INTRAVENOUS | Qty: 17 | Status: AC

## 2022-06-03 ENCOUNTER — Other Ambulatory Visit: Payer: Self-pay | Admitting: Nurse Practitioner

## 2022-06-03 ENCOUNTER — Other Ambulatory Visit: Payer: Self-pay

## 2022-06-03 ENCOUNTER — Inpatient Hospital Stay: Payer: BC Managed Care – PPO

## 2022-06-03 VITALS — BP 144/75 | HR 78 | Temp 98.7°F | Resp 18

## 2022-06-03 DIAGNOSIS — K2901 Acute gastritis with bleeding: Secondary | ICD-10-CM

## 2022-06-03 DIAGNOSIS — D500 Iron deficiency anemia secondary to blood loss (chronic): Secondary | ICD-10-CM

## 2022-06-03 DIAGNOSIS — E10649 Type 1 diabetes mellitus with hypoglycemia without coma: Secondary | ICD-10-CM

## 2022-06-03 DIAGNOSIS — D0511 Intraductal carcinoma in situ of right breast: Secondary | ICD-10-CM | POA: Diagnosis not present

## 2022-06-03 DIAGNOSIS — K5521 Angiodysplasia of colon with hemorrhage: Secondary | ICD-10-CM

## 2022-06-03 MED ORDER — DEXCOM G6 SENSOR MISC: 0 refills | Status: DC

## 2022-06-03 MED ORDER — SODIUM CHLORIDE 0.9 % IV SOLN: Freq: Once | INTRAVENOUS | Status: AC

## 2022-06-03 MED ORDER — FAMOTIDINE IN NACL 20-0.9 MG/50ML-% IV SOLN
20.0000 mg | Freq: Once | INTRAVENOUS | Status: AC
  Administered 2022-06-03: 20 mg via INTRAVENOUS
  Filled 2022-06-03: qty 50

## 2022-06-03 MED ORDER — ACETAMINOPHEN 325 MG PO TABS
650.0000 mg | ORAL_TABLET | Freq: Once | ORAL | Status: AC
  Administered 2022-06-03: 650 mg via ORAL
  Filled 2022-06-03: qty 2

## 2022-06-03 MED ORDER — SODIUM CHLORIDE 0.9 % IV SOLN
510.0000 mg | Freq: Once | INTRAVENOUS | Status: AC
  Administered 2022-06-03: 510 mg via INTRAVENOUS
  Filled 2022-06-03: qty 510

## 2022-06-03 NOTE — Patient Instructions (Signed)

## 2022-06-04 ENCOUNTER — Other Ambulatory Visit: Payer: Self-pay | Admitting: Nurse Practitioner

## 2022-06-08 ENCOUNTER — Other Ambulatory Visit: Payer: BC Managed Care – PPO

## 2022-06-22 ENCOUNTER — Ambulatory Visit: Payer: BC Managed Care – PPO | Admitting: Gastroenterology

## 2022-07-12 ENCOUNTER — Encounter (HOSPITAL_COMMUNITY): Payer: Self-pay | Admitting: Gastroenterology

## 2022-07-12 NOTE — Progress Notes (Signed)
Attempted to obtain medical history via telephone, unable to reach at this time. HIPAA compliant voicemail message left requesting return call to pre surgical testing department. 

## 2022-07-19 ENCOUNTER — Telehealth: Payer: Self-pay

## 2022-07-19 ENCOUNTER — Ambulatory Visit (HOSPITAL_COMMUNITY)
Admission: RE | Admit: 2022-07-19 | Discharge: 2022-07-19 | Disposition: A | Discharge status: Home / Self Care | Payer: BC Managed Care – PPO | Attending: Gastroenterology | Admitting: Gastroenterology

## 2022-07-19 ENCOUNTER — Ambulatory Visit (HOSPITAL_COMMUNITY): Payer: BC Managed Care – PPO | Admitting: Registered Nurse

## 2022-07-19 ENCOUNTER — Encounter (HOSPITAL_COMMUNITY)
Admission: RE | Disposition: A | Discharge status: Home / Self Care | Payer: Self-pay | Source: Home / Self Care | Attending: Gastroenterology

## 2022-07-19 ENCOUNTER — Other Ambulatory Visit: Payer: Self-pay

## 2022-07-19 ENCOUNTER — Encounter (HOSPITAL_COMMUNITY): Payer: Self-pay | Admitting: Gastroenterology

## 2022-07-19 DIAGNOSIS — Z538 Procedure and treatment not carried out for other reasons: Secondary | ICD-10-CM

## 2022-07-19 DIAGNOSIS — D509 Iron deficiency anemia, unspecified: Secondary | ICD-10-CM

## 2022-07-19 DIAGNOSIS — D500 Iron deficiency anemia secondary to blood loss (chronic): Secondary | ICD-10-CM | POA: Insufficient documentation

## 2022-07-19 DIAGNOSIS — D759 Disease of blood and blood-forming organs, unspecified: Secondary | ICD-10-CM | POA: Insufficient documentation

## 2022-07-19 DIAGNOSIS — K5520 Angiodysplasia of colon without hemorrhage: Secondary | ICD-10-CM

## 2022-07-19 DIAGNOSIS — E1165 Type 2 diabetes mellitus with hyperglycemia: Secondary | ICD-10-CM | POA: Insufficient documentation

## 2022-07-19 DIAGNOSIS — Z794 Long term (current) use of insulin: Secondary | ICD-10-CM | POA: Diagnosis not present

## 2022-07-19 DIAGNOSIS — J45909 Unspecified asthma, uncomplicated: Secondary | ICD-10-CM | POA: Insufficient documentation

## 2022-07-19 HISTORY — PX: COLONOSCOPY WITH PROPOFOL: SHX5780

## 2022-07-19 LAB — GLUCOSE, CAPILLARY
Glucose-Capillary: 178 mg/dL — ABNORMAL HIGH (ref 70–99)
Glucose-Capillary: 184 mg/dL — ABNORMAL HIGH (ref 70–99)

## 2022-07-19 SURGERY — COLONOSCOPY WITH PROPOFOL
Anesthesia: Monitor Anesthesia Care

## 2022-07-19 MED ORDER — LACTATED RINGERS IV SOLN: INTRAVENOUS | Status: DC

## 2022-07-19 MED ORDER — PROPOFOL 1000 MG/100ML IV EMUL
INTRAVENOUS | Status: AC
  Filled 2022-07-19: qty 100

## 2022-07-19 MED ORDER — SODIUM CHLORIDE 0.9 % IV SOLN: INTRAVENOUS | Status: DC

## 2022-07-19 MED ORDER — PROPOFOL 500 MG/50ML IV EMUL
INTRAVENOUS | Status: DC | PRN
  Administered 2022-07-19: 20 mg via INTRAVENOUS
  Administered 2022-07-19: 140 ug/kg/min via INTRAVENOUS
  Administered 2022-07-19: 10 mg via INTRAVENOUS

## 2022-07-19 SURGICAL SUPPLY — 22 items

## 2022-07-19 NOTE — Discharge Instructions (Signed)
YOU HAD AN ENDOSCOPIC PROCEDURE TODAY: Refer to the procedure report and other information in the discharge instructions given to you for any specific questions about what was found during the examination. If this information does not answer your questions, please call [place] office at [phone] to clarify.  ° °YOU SHOULD EXPECT: Some feelings of bloating in the abdomen. Passage of more gas than usual. Walking can help get rid of the air that was put into your GI tract during the procedure and reduce the bloating. If you had a lower endoscopy (such as a colonoscopy or flexible sigmoidoscopy) you may notice spotting of blood in your stool or on the toilet paper. Some abdominal soreness may be present for a day or two, also. ° °DIET: Your first meal following the procedure should be a light meal and then it is ok to progress to your normal diet. A half-sandwich or bowl of soup is an example of a good first meal. Heavy or fried foods are harder to digest and may make you feel nauseous or bloated. Drink plenty of fluids but you should avoid alcoholic beverages for 24 hours. If you had a esophageal dilation, please see attached instructions for diet.   ° °ACTIVITY: Your care partner should take you home directly after the procedure. You should plan to take it easy, moving slowly for the rest of the day. You can resume normal activity the day after the procedure however YOU SHOULD NOT DRIVE, use power tools, machinery or perform tasks that involve climbing or major physical exertion for 24 hours (because of the sedation medicines used during the test).  ° °SYMPTOMS TO REPORT IMMEDIATELY: °A gastroenterologist can be reached at any hour. Please call [phone]  for any of the following symptoms:  °Following lower endoscopy (colonoscopy, flexible sigmoidoscopy) °Excessive amounts of blood in the stool  °Significant tenderness, worsening of abdominal pains  °Swelling of the abdomen that is new, acute  °Fever of 100° or  higher  °Following upper endoscopy (EGD, EUS, ERCP, esophageal dilation) °Vomiting of blood or coffee ground material  °New, significant abdominal pain  °New, significant chest pain or pain under the shoulder blades  °Painful or persistently difficult swallowing  °New shortness of breath  °Black, tarry-looking or red, bloody stools ° °FOLLOW UP:  °If any biopsies were taken you will be contacted by phone or by letter within the next 1-3 weeks. Call [phone]  if you have not heard about the biopsies in 3 weeks.  °Please also call with any specific questions about appointments or follow up tests. ° °

## 2022-07-19 NOTE — Op Note (Signed)
[facility] Patient Name: Nicole Marquez Procedure Date: 07/19/2022 MRN: [id number] Attending MD: Thornton Park MD, MD, [id number] Date of Birth: 14-May-1961 CSN: [id number] Age: 61 Admit Type: Outpatient Procedure:                Colonoscopy Indications:              Iron deficiency anemia secondary to chronic blood                            loss, AVMs seen on prior colonoscopy, presents now                            for APC of the right colon AVMs Providers:                Thornton Park MD, MD, Jaci Carrel, RN,                            Fransico Setters Mbumina, Technician Referring MD:              Medicines:                Monitored Anesthesia Care Complications:            No immediate complications. Estimated Blood Loss:     Estimated blood loss: none. Procedure:                Pre-Anesthesia Assessment:                           - Prior to the procedure, a History and Physical                            was performed, and patient medications and                            allergies were reviewed. The patient's tolerance of                            previous anesthesia was also reviewed. The risks                            and benefits of the procedure and the sedation                            options and risks were discussed with the patient.                            All questions were answered, and informed consent                            was obtained. Prior Anticoagulants: The patient has                            taken no anticoagulant or antiplatelet agents. ASA  Grade Assessment: III - A patient with severe                            systemic disease. After reviewing the risks and                            benefits, the patient was deemed in satisfactory                            condition to undergo the procedure.                           After obtaining informed consent, the colonoscope                             was passed under direct vision. Throughout the                            procedure, the patient's blood pressure, pulse, and                            oxygen saturations were monitored continuously. The                            CF-HQ190L ([id number]) Olympus colonoscope was                            introduced through the anus with the intention of                            advancing to the ileum. The scope was advanced to                            the transverse colon before the procedure was                            aborted. Medications were given. The colonoscopy                            was unusually difficult due to inadequate bowel                            prep. The patient tolerated the procedure well. The                            quality of the bowel preparation was inadequate. No                            anatomical landmarks were photographed. Scope In: 8:29:23 AM Scope Out: 8:33:42 AM Total Procedure Duration: 0 hours 4 minutes 19 seconds  Findings:      The perianal and digital rectal examinations were normal.      A moderate amount of liquid semi-liquid stool was found in the entire  examined colon, interfering with visualization. AVMs could not be       localized due to the amount of stool. Impression:               - Preparation of the colon was inadequate.                           - Stool in the entire examined colon limiting                            identification and treatment of known right-sided                            colon AVMs.                           - No specimens collected. Moderate Sedation:      Not Applicable - Patient had care per Anesthesia. Recommendation:           - Patient has a contact number available for                            emergencies. The signs and symptoms of potential                            delayed complications were discussed with the                            patient. Return to normal activities tomorrow.                             Written discharge instructions were provided to the                            patient.                           - Resume previous diet.                           - Continue present medications.                           - Repeat colonoscopy at the next available                            appointment because the bowel preparation was poor. 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 All rights reserved. The codes documented in this report are preliminary and upon coder review may  be revised to meet current compliance requirements. Thornton Park MD, MD 07/19/2022 8:43:04 AM This report has been signed electronically. Number of Addenda: 0

## 2022-07-19 NOTE — Transfer of Care (Signed)
Immediate Anesthesia Transfer of Care Note  Patient: Nicole Marquez  Procedure(s) Performed: CANCELLED PROCEDURE  Patient Location: PACU and Endoscopy Unit  Anesthesia Type:MAC  Level of Consciousness: awake, alert , oriented, and patient cooperative  Airway & Oxygen Therapy: Patient Spontanous Breathing and Patient connected to face mask oxygen  Post-op Assessment: Report given to RN, Post -op Vital signs reviewed and stable, and Patient moving all extremities  Post vital signs: Reviewed and stable  Last Vitals:  Vitals Value Taken Time  BP    Temp    Pulse    Resp    SpO2      Last Pain:  Vitals:   07/19/22 0818  TempSrc:   PainSc: 0-No pain         Complications: No notable events documented.

## 2022-07-19 NOTE — Anesthesia Preprocedure Evaluation (Addendum)
Anesthesia Evaluation  Patient identified by MRN, date of birth, ID band Patient awake    Reviewed: Allergy & Precautions, NPO status , Patient's Chart, lab work & pertinent test results  Airway Mallampati: III  TM Distance: >3 FB Neck ROM: Full    Dental  (+) Poor Dentition, Dental Advisory Given   Pulmonary asthma (used inhaler this AM) , former smoker Quit smoking 2008, 30 pack year history   Pulmonary exam normal breath sounds clear to auscultation       Cardiovascular negative cardio ROS Normal cardiovascular exam Rhythm:Regular Rate:Normal     Neuro/Psych  PSYCHIATRIC DISORDERS Anxiety     negative neurological ROS     GI/Hepatic Neg liver ROS,GERD  Medicated and Controlled,,Small bowel AVMs   Endo/Other  diabetes, Poorly Controlled, Type 2, Insulin DependentHypothyroidism  FS 178 this AM  Renal/GU negative Renal ROS  negative genitourinary   Musculoskeletal negative musculoskeletal ROS (+)    Abdominal   Peds  Hematology  (+) Blood dyscrasia, anemia   Anesthesia Other Findings   Reproductive/Obstetrics negative OB ROS                             Anesthesia Physical Anesthesia Plan  ASA: 3  Anesthesia Plan: MAC   Post-op Pain Management:    Induction:   PONV Risk Score and Plan: 2 and Propofol infusion and TIVA  Airway Management Planned: Natural Airway and Simple Face Mask  Additional Equipment: None  Intra-op Plan:   Post-operative Plan:   Informed Consent: I have reviewed the patients History and Physical, chart, labs and discussed the procedure including the risks, benefits and alternatives for the proposed anesthesia with the patient or authorized representative who has indicated his/her understanding and acceptance.       Plan Discussed with: CRNA  Anesthesia Plan Comments:        Anesthesia Quick Evaluation

## 2022-07-19 NOTE — Anesthesia Postprocedure Evaluation (Signed)
Anesthesia Post Note  Patient: Nicole Marquez  Procedure(s) Performed: CANCELLED PROCEDURE     Patient location during evaluation: PACU Anesthesia Type: MAC Level of consciousness: awake and alert Pain management: pain level controlled Vital Signs Assessment: post-procedure vital signs reviewed and stable Respiratory status: spontaneous breathing, nonlabored ventilation and respiratory function stable Cardiovascular status: blood pressure returned to baseline and stable Postop Assessment: no apparent nausea or vomiting Anesthetic complications: no   No notable events documented.  Last Vitals:  Vitals:   07/19/22 0900 07/19/22 0905  BP: (!) 146/60   Pulse: 67 61  Resp: (!) 9 (!) 9  Temp:    SpO2: 100% 99%    Last Pain:  Vitals:   07/19/22 0905  TempSrc:   PainSc: 0-No pain                 Pervis Hocking

## 2022-07-19 NOTE — Telephone Encounter (Signed)
-----   Message from Thornton Park, MD sent at 07/19/2022  8:53 AM EST ----- Please reschedule colonoscopy at the hospital with a 2 day bowel prep. She will need Zofran prior to each dose.  Thanks.  KLB

## 2022-07-19 NOTE — H&P (Signed)
   Referring Provider: No ref. provider found Primary Care Physician:  Rochel Brome, MD  Indication for Colonoscopy:  Colonic AVMs in the setting of iron deficiency anemia   IMPRESSION:  Colonic AVMs in the setting of iron deficiency anemia   PLAN: Colonoscopy with APC   HPI: Nicole Marquez is a 61 y.o. female presents for therapeutic colonoscopy.  Colonoscopy 05/14/22: - Multiple non-bleeding colonic angiodysplastic lesions. These are the likely source of anemia. - The examination was otherwise normal on direct and retroflexion views. - No specimens collected.  She presents today for APC therapy of the colonic AVMs.  Prior to this procedure I did try to obtain a capsule endoscopy to determine if small bowel lesion should be treated at the same time but her insurance denied the request.   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)    Exudative age-related macular degeneration, bilateral, stage unspecified ([place])    Family history of colon cancer 02/27/2021   Family history of malignant neoplasm of digestive organs    Mixed hyperlipidemia    Other specified hypothyroidism    Type 1 diabetes mellitus with hypoglycemia without coma ([place])     Past Surgical History:  Procedure Laterality Date   BREAST LUMPECTOMY WITH RADIOACTIVE SEED LOCALIZATION Right 05/28/2021   Procedure: RIGHT BREAST LUMPECTOMY WITH RADIOACTIVE SEED LOCALIZATION X2;  Surgeon: [name], MD;  Location: [place];  Service: General;  Laterality: Right;   CESAREAN SECTION     x3   ENDOMETRIAL ABLATION     Right rotator cuff repair      Current Facility-Administered Medications  Medication Dose Route Frequency Provider Last Rate Last Admin   0.9 %  sodium chloride infusion   Intravenous Continuous Thornton Park, MD       lactated ringers infusion   Intravenous Continuous Thornton Park, MD 10 mL/hr at 07/19/22 0810  Continued from Pre-op at 07/19/22 0810    Allergies as of 05/19/2022 - Review Complete 05/18/2022  Allergen Reaction Noted   Ciprofloxacin Hives 12/24/2019   Lipitor [atorvastatin]  11/25/2020    Family History  Problem Relation Age of Onset   Cancer - Other Mother 76       carcinoid of colon   Diabetes Father    Colon cancer Brother 36   Lung cancer Paternal Aunt        dx >50; smoking hx   Cancer - Other Maternal Grandfather 18       carcinoid of colon   Esophageal cancer Neg Hx      Physical Exam: General:   Alert,  well-nourished, pleasant and cooperative in NAD Head:  Normocephalic and atraumatic. Eyes:  Sclera clear, no icterus.   Conjunctiva pink. Mouth:  No deformity or lesions.   Neck:  Supple; no masses or thyromegaly. Lungs:  Clear throughout to auscultation.   No wheezes. Heart:  Regular rate and rhythm; no murmurs. Abdomen:  Soft, non-tender, nondistended, normal bowel sounds, no rebound or guarding.  Msk:  Symmetrical. No boney deformities LAD: No inguinal or umbilical LAD Extremities:  No clubbing or edema. Neurologic:  Alert and  oriented x4;  grossly nonfocal Skin:  No obvious rash or bruise. Psych:  Alert and cooperative. Normal mood and affect.     Studies/Results: No results found.    Hudsen Fei L. Tarri Glenn, MD, MPH 07/19/2022, 8:13 AM

## 2022-07-20 ENCOUNTER — Other Ambulatory Visit: Payer: Self-pay

## 2022-07-20 DIAGNOSIS — D509 Iron deficiency anemia, unspecified: Secondary | ICD-10-CM

## 2022-07-20 DIAGNOSIS — K5520 Angiodysplasia of colon without hemorrhage: Secondary | ICD-10-CM

## 2022-07-20 MED ORDER — ONDANSETRON HCL 4 MG PO TABS: ORAL_TABLET | ORAL | 0 refills | Status: DC

## 2022-07-20 MED ORDER — SUTAB 1479-225-188 MG PO TABS: 1.0000 | ORAL_TABLET | ORAL | 0 refills | Status: DC

## 2022-07-20 NOTE — Telephone Encounter (Signed)
Spoke with patient's husband regarding MD recommendations. He's requesting that patient have sutab. Stated she has not done well with previous preps. Instructions sent to [facility] & advised he call back with any questions.

## 2022-07-20 NOTE — Telephone Encounter (Signed)
Prep & Zofran prescription has also been sent to pharmacy.

## 2022-07-20 NOTE — Telephone Encounter (Signed)
Patient husband is calling to schedule procedure.

## 2022-07-20 NOTE — Telephone Encounter (Signed)
Colon scheduled for 09/07/22 at 8:15 am at [facility] with Dr. Tarri Glenn. Discussed with husband about the need for the 2 day prep. He doesn't feel comfortable with it since patient is a Type 1 diabetic. She is not on an insulin pump, but does take an insulin injection daily. States the one day prep was already difficult enough going without food, and that her colon was clean with just the one day prep back on 05/19/22. He's requesting that I make Dr. Tarri Glenn aware to see if patient can do only one day prep.

## 2022-07-21 ENCOUNTER — Encounter (HOSPITAL_COMMUNITY): Payer: Self-pay | Admitting: Gastroenterology

## 2022-08-10 ENCOUNTER — Other Ambulatory Visit: Payer: Self-pay | Admitting: Gastroenterology

## 2022-08-10 DIAGNOSIS — D509 Iron deficiency anemia, unspecified: Secondary | ICD-10-CM

## 2022-08-10 DIAGNOSIS — Z800 Family history of malignant neoplasm of digestive organs: Secondary | ICD-10-CM

## 2022-08-30 ENCOUNTER — Other Ambulatory Visit: Payer: Self-pay

## 2022-08-30 ENCOUNTER — Encounter (HOSPITAL_COMMUNITY): Payer: Self-pay | Admitting: Gastroenterology

## 2022-08-30 NOTE — Progress Notes (Signed)
Attempted to obtain medical history. Unable to reach pt. At this time. HIPAA complaint voicemail left with pre-surgical testing number. 

## 2022-09-07 ENCOUNTER — Ambulatory Visit (HOSPITAL_COMMUNITY)
Admission: RE | Admit: 2022-09-07 | Discharge: 2022-09-07 | Disposition: A | Discharge status: Home / Self Care | Payer: BC Managed Care – PPO | Attending: Gastroenterology | Admitting: Gastroenterology

## 2022-09-07 ENCOUNTER — Other Ambulatory Visit: Payer: Self-pay

## 2022-09-07 ENCOUNTER — Encounter (HOSPITAL_COMMUNITY): Payer: Self-pay | Admitting: Gastroenterology

## 2022-09-07 ENCOUNTER — Encounter (HOSPITAL_COMMUNITY)
Admission: RE | Disposition: A | Discharge status: Home / Self Care | Payer: Self-pay | Source: Home / Self Care | Attending: Gastroenterology

## 2022-09-07 ENCOUNTER — Ambulatory Visit (HOSPITAL_COMMUNITY): Payer: BC Managed Care – PPO | Admitting: Registered Nurse

## 2022-09-07 DIAGNOSIS — J45909 Unspecified asthma, uncomplicated: Secondary | ICD-10-CM | POA: Diagnosis not present

## 2022-09-07 DIAGNOSIS — K5520 Angiodysplasia of colon without hemorrhage: Secondary | ICD-10-CM

## 2022-09-07 DIAGNOSIS — Z87891 Personal history of nicotine dependence: Secondary | ICD-10-CM | POA: Diagnosis not present

## 2022-09-07 DIAGNOSIS — K5521 Angiodysplasia of colon with hemorrhage: Secondary | ICD-10-CM | POA: Diagnosis not present

## 2022-09-07 DIAGNOSIS — K219 Gastro-esophageal reflux disease without esophagitis: Secondary | ICD-10-CM | POA: Diagnosis not present

## 2022-09-07 DIAGNOSIS — D509 Iron deficiency anemia, unspecified: Secondary | ICD-10-CM

## 2022-09-07 DIAGNOSIS — E1165 Type 2 diabetes mellitus with hyperglycemia: Secondary | ICD-10-CM | POA: Diagnosis not present

## 2022-09-07 DIAGNOSIS — E039 Hypothyroidism, unspecified: Secondary | ICD-10-CM | POA: Insufficient documentation

## 2022-09-07 DIAGNOSIS — K922 Gastrointestinal hemorrhage, unspecified: Secondary | ICD-10-CM

## 2022-09-07 HISTORY — DX: Personal history of urinary calculi: Z87.442

## 2022-09-07 HISTORY — PX: COLONOSCOPY WITH PROPOFOL: SHX5780

## 2022-09-07 HISTORY — DX: Personal history of other diseases of the digestive system: Z87.19

## 2022-09-07 HISTORY — PX: HOT HEMOSTASIS: SHX5433

## 2022-09-07 HISTORY — DX: Gastro-esophageal reflux disease without esophagitis: K21.9

## 2022-09-07 LAB — CBC WITH DIFFERENTIAL/PLATELET
Abs Immature Granulocytes: 0.01 10*3/uL (ref 0.00–0.07)
Basophils Absolute: 0.1 10*3/uL (ref 0.0–0.1)
Basophils Relative: 1 %
Eosinophils Absolute: 0.1 10*3/uL (ref 0.0–0.5)
Eosinophils Relative: 2 %
HCT: 38 % (ref 36.0–46.0)
Hemoglobin: 12.5 g/dL (ref 12.0–15.0)
Immature Granulocytes: 0 %
Lymphocytes Relative: 34 %
Lymphs Abs: 2.1 10*3/uL (ref 0.7–4.0)
MCH: 27.8 pg (ref 26.0–34.0)
MCHC: 32.9 g/dL (ref 30.0–36.0)
MCV: 84.4 fL (ref 80.0–100.0)
Monocytes Absolute: 0.4 10*3/uL (ref 0.1–1.0)
Monocytes Relative: 7 %
Neutro Abs: 3.3 10*3/uL (ref 1.7–7.7)
Neutrophils Relative %: 56 %
Platelets: 222 10*3/uL (ref 150–400)
RBC: 4.5 MIL/uL (ref 3.87–5.11)
RDW: 14.2 % (ref 11.5–15.5)
WBC: 6 10*3/uL (ref 4.0–10.5)
nRBC: 0 % (ref 0.0–0.2)

## 2022-09-07 LAB — FERRITIN: Ferritin: 48 ng/mL (ref 11–307)

## 2022-09-07 LAB — GLUCOSE, CAPILLARY: Glucose-Capillary: 101 mg/dL — ABNORMAL HIGH (ref 70–99)

## 2022-09-07 SURGERY — COLONOSCOPY WITH PROPOFOL
Anesthesia: Monitor Anesthesia Care

## 2022-09-07 MED ORDER — LACTATED RINGERS IV SOLN: INTRAVENOUS | Status: DC | PRN

## 2022-09-07 MED ORDER — PROPOFOL 1000 MG/100ML IV EMUL
INTRAVENOUS | Status: AC
  Filled 2022-09-07: qty 100

## 2022-09-07 MED ORDER — SODIUM CHLORIDE 0.9 % IV SOLN: INTRAVENOUS | Status: DC

## 2022-09-07 MED ORDER — PROPOFOL 500 MG/50ML IV EMUL
INTRAVENOUS | Status: DC | PRN
  Administered 2022-09-07: 130 ug/kg/min via INTRAVENOUS
  Administered 2022-09-07: 20 mg via INTRAVENOUS

## 2022-09-07 MED ORDER — PHENYLEPHRINE 80 MCG/ML (10ML) SYRINGE FOR IV PUSH (FOR BLOOD PRESSURE SUPPORT)
PREFILLED_SYRINGE | INTRAVENOUS | Status: DC | PRN
  Administered 2022-09-07: 80 ug via INTRAVENOUS

## 2022-09-07 SURGICAL SUPPLY — 22 items

## 2022-09-07 NOTE — Anesthesia Preprocedure Evaluation (Signed)
Anesthesia Evaluation  Patient identified by MRN, date of birth, ID band Patient awake    Reviewed: Allergy & Precautions, NPO status , Patient's Chart, lab work & pertinent test results  Airway Mallampati: III  TM Distance: >3 FB Neck ROM: Full    Dental  (+) Poor Dentition, Dental Advisory Given   Pulmonary asthma (used inhaler this AM) , former smoker Quit smoking 2008, 30 pack year history   Pulmonary exam normal breath sounds clear to auscultation       Cardiovascular negative cardio ROS Normal cardiovascular exam Rhythm:Regular Rate:Normal     Neuro/Psych  PSYCHIATRIC DISORDERS Anxiety     negative neurological ROS     GI/Hepatic Neg liver ROS,GERD  Medicated and Controlled,,Small bowel AVMs   Endo/Other  diabetes, Poorly Controlled, Type 2, Insulin DependentHypothyroidism  FS 178 this AM  Renal/GU negative Renal ROS  negative genitourinary   Musculoskeletal negative musculoskeletal ROS (+)    Abdominal Normal abdominal exam  (+)   Peds  Hematology  (+) Blood dyscrasia, anemia   Anesthesia Other Findings   Reproductive/Obstetrics negative OB ROS                             Anesthesia Physical Anesthesia Plan  ASA: 3  Anesthesia Plan: MAC   Post-op Pain Management: Minimal or no pain anticipated   Induction: Intravenous  PONV Risk Score and Plan: 2 and Propofol infusion and TIVA  Airway Management Planned: Natural Airway and Simple Face Mask  Additional Equipment: None  Intra-op Plan:   Post-operative Plan:   Informed Consent: I have reviewed the patients History and Physical, chart, labs and discussed the procedure including the risks, benefits and alternatives for the proposed anesthesia with the patient or authorized representative who has indicated his/her understanding and acceptance.       Plan Discussed with: CRNA  Anesthesia Plan Comments:         Anesthesia Quick Evaluation

## 2022-09-07 NOTE — Transfer of Care (Signed)
Immediate Anesthesia Transfer of Care Note  Patient: Nicole Marquez  Procedure(s) Performed: COLONOSCOPY WITH PROPOFOL HOT HEMOSTASIS (ARGON PLASMA COAGULATION/BICAP)  Patient Location: PACU and Endoscopy Unit  Anesthesia Type:MAC  Level of Consciousness: awake, alert , oriented, and patient cooperative  Airway & Oxygen Therapy: Patient Spontanous Breathing and Patient connected to face mask oxygen  Post-op Assessment: Report given to RN, Post -op Vital signs reviewed and stable, and Patient moving all extremities  Post vital signs: Reviewed and stable  Last Vitals:  Vitals Value Taken Time  BP    Temp    Pulse 79 09/07/22 0909  Resp 13 09/07/22 0909  SpO2 100 % 09/07/22 0909  Vitals shown include unvalidated device data.  Last Pain:  Vitals:   09/07/22 0729  TempSrc: Temporal  PainSc: 0-No pain         Complications: No notable events documented.

## 2022-09-07 NOTE — Op Note (Signed)
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 [place],                            CRNA Referring MD:              Medicines:                Monitored Anesthesia Care Complications:            No immediate complications. Estimated Blood Loss:     Estimated blood loss was minimal. Procedure:                Pre-Anesthesia Assessment:                           - Prior to the procedure, a History and Physical                            was performed, and patient medications and                            allergies were reviewed. The patient's tolerance of                            previous anesthesia was also reviewed. The risks                            and benefits of the procedure and the sedation                            options and risks were discussed with the patient.                            All questions were answered, and informed consent                            was obtained. Prior Anticoagulants: The patient has                            taken no anticoagulant  or antiplatelet agents. ASA                            Grade Assessment: III - A patient with severe                            systemic disease. After reviewing the risks and                            benefits, the patient was deemed in satisfactory                            condition to undergo the procedure.                           After obtaining informed  consent, the colonoscope                            was passed under direct vision. Throughout the                            procedure, the patient's blood pressure, pulse, and                            oxygen saturations were monitored continuously. The                            CF-HQ190L ([id number]) Olympus colonoscope was                            introduced through the anus and advanced to the 3                            cm into the ileum. The colonoscopy was performed                            without difficulty. The patient tolerated the                            procedure well. The quality of the bowel                            preparation was good. The terminal ileum, ileocecal                            valve, appendiceal orifice, and rectum were                            photographed. Scope In: 8:40:15 AM Scope Out: 9:02:41 AM Scope Withdrawal Time: 0 hours 13 minutes 0 seconds  Total Procedure Duration: 0 hours 22 minutes 26 seconds  Findings:      The perianal and digital rectal examinations were normal.      Five medium-sized angioectasias with stigmata of recent bleeding were  found in the proximal ascending colon and in the cecum. Coagulation for       bleeding prevention using argon plasma was successful. Estimated blood       loss was minimal.      The exam was otherwise without abnormality on direct and retroflexion       views. Impression:               - Five recently bleeding colonic angioectasias.                            Treated with argon plasma coagulation (APC).                           - The examination was otherwise normal on direct                            and retroflexion views.                           - No specimens collected. Moderate Sedation:      Not Applicable - Patient had care per Anesthesia. Recommendation:           - Patient has a contact number available for                            emergencies. The signs and symptoms of  potential                            delayed complications were discussed with the                            patient. Return to normal activities tomorrow.                            Written discharge instructions were provided to the                            patient.                           - High fiber diet.                           - Continue present medications.                           - Repeat colonoscopy PRN for retreatment with any                            evidence for recurrent bleeding.                           - Repeat colonoscopy in 10 years for colon cancer                            screening, earlier with new symptoms.                           -  Labs today including CBC and iron studies.                           - Return to GI office in 12 weeks, earlier if                            needed. 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 All rights reserved. The codes documented in this report are preliminary and upon coder review may  be revised to meet current compliance requirements. Thornton Park MD, MD 09/07/2022 9:12:55 AM This report has been signed electronically. Number of Addenda: 0

## 2022-09-07 NOTE — Anesthesia Procedure Notes (Signed)
Procedure Name: MAC Date/Time: 09/07/2022 8:25 AM  Performed by: Victoriano Lain, CRNAPre-anesthesia Checklist: Patient identified, Emergency Drugs available, Suction available, Patient being monitored and Timeout performed Patient Re-evaluated:Patient Re-evaluated prior to induction Oxygen Delivery Method: Simple face mask Placement Confirmation: CO2 detector Dental Injury: Teeth and Oropharynx as per pre-operative assessment

## 2022-09-07 NOTE — Discharge Instructions (Signed)
YOU HAD AN ENDOSCOPIC PROCEDURE TODAY: Refer to the procedure report and other information in the discharge instructions given to you for any specific questions about what was found during the examination. If this information does not answer your questions, please call [place] office at [phone] to clarify.  ° °YOU SHOULD EXPECT: Some feelings of bloating in the abdomen. Passage of more gas than usual. Walking can help get rid of the air that was put into your GI tract during the procedure and reduce the bloating. If you had a lower endoscopy (such as a colonoscopy or flexible sigmoidoscopy) you may notice spotting of blood in your stool or on the toilet paper. Some abdominal soreness may be present for a day or two, also. ° °DIET: Your first meal following the procedure should be a light meal and then it is ok to progress to your normal diet. A half-sandwich or bowl of soup is an example of a good first meal. Heavy or fried foods are harder to digest and may make you feel nauseous or bloated. Drink plenty of fluids but you should avoid alcoholic beverages for 24 hours. If you had a esophageal dilation, please see attached instructions for diet.   ° °ACTIVITY: Your care partner should take you home directly after the procedure. You should plan to take it easy, moving slowly for the rest of the day. You can resume normal activity the day after the procedure however YOU SHOULD NOT DRIVE, use power tools, machinery or perform tasks that involve climbing or major physical exertion for 24 hours (because of the sedation medicines used during the test).  ° °SYMPTOMS TO REPORT IMMEDIATELY: °A gastroenterologist can be reached at any hour. Please call [phone]  for any of the following symptoms:  °Following lower endoscopy (colonoscopy, flexible sigmoidoscopy) °Excessive amounts of blood in the stool  °Significant tenderness, worsening of abdominal pains  °Swelling of the abdomen that is new, acute  °Fever of 100° or  higher  °Following upper endoscopy (EGD, EUS, ERCP, esophageal dilation) °Vomiting of blood or coffee ground material  °New, significant abdominal pain  °New, significant chest pain or pain under the shoulder blades  °Painful or persistently difficult swallowing  °New shortness of breath  °Black, tarry-looking or red, bloody stools ° °FOLLOW UP:  °If any biopsies were taken you will be contacted by phone or by letter within the next 1-3 weeks. Call [phone]  if you have not heard about the biopsies in 3 weeks.  °Please also call with any specific questions about appointments or follow up tests. ° °

## 2022-09-07 NOTE — Anesthesia Postprocedure Evaluation (Signed)
Anesthesia Post Note  Patient: Nicole Marquez  Procedure(s) Performed: COLONOSCOPY WITH PROPOFOL HOT HEMOSTASIS (ARGON PLASMA COAGULATION/BICAP)     Patient location during evaluation: Endoscopy Anesthesia Type: MAC Level of consciousness: awake Pain management: pain level controlled Vital Signs Assessment: post-procedure vital signs reviewed and stable Respiratory status: spontaneous breathing Cardiovascular status: stable Postop Assessment: no apparent nausea or vomiting Anesthetic complications: no  No notable events documented.  Last Vitals:  Vitals:   09/07/22 0908 09/07/22 0918  BP: (!) 116/49 124/85  Pulse: 83 75  Resp: 10 15  Temp: 36.6 C   SpO2: 100% 98%    Last Pain:  Vitals:   09/07/22 0918  TempSrc:   PainSc: 0-No pain                 Huston Foley

## 2022-09-07 NOTE — H&P (Signed)
   Referring Provider: No ref. provider found Primary Care Physician:  Rochel Brome, MD  Indication for Colonoscopy:  Colon AVMs   IMPRESSION:  Colon AVMs with associated GI blood loss  PLAN: Colonoscopy   HPI: Nicole Marquez is a 61 y.o. female presents for therapeutic colonoscopy to treat known colonic AVMs identified on colonoscopy 9/23.   Past Medical History:  Diagnosis Date   Anemia    Asthma    seasonal   Breast cancer ([place]) 2022   right   DCIS (ductal carcinoma in situ)    Exudative age-related macular degeneration, bilateral, stage unspecified ([place])    Family history of colon cancer 02/27/2021   Family history of malignant neoplasm of digestive organs    GERD (gastroesophageal reflux disease)    History of hiatal hernia    History of kidney stones    Mixed hyperlipidemia    Other specified hypothyroidism    Type 1 diabetes mellitus with hypoglycemia without coma ([place])     Past Surgical History:  Procedure Laterality Date   BREAST LUMPECTOMY WITH RADIOACTIVE SEED LOCALIZATION Right 05/28/2021   Procedure: RIGHT BREAST LUMPECTOMY WITH RADIOACTIVE SEED LOCALIZATION X2;  Surgeon: [name], MD;  Location: [facility];  Service: General;  Laterality: Right;   CESAREAN SECTION     x3   COLONOSCOPY WITH PROPOFOL N/A 07/19/2022   Procedure: COLONOSCOPY WITH PROPOFOL;  Surgeon: Thornton Park, MD;  Location: [facility];  Service: Gastroenterology;  Laterality: N/A;   ENDOMETRIAL ABLATION     Right rotator cuff repair      No current facility-administered medications for this encounter.    Allergies as of 07/20/2022 - Review Complete 07/19/2022  Allergen Reaction Noted   Ciprofloxacin Hives 12/24/2019   Lipitor [atorvastatin]  11/25/2020    Family History  Problem Relation Age of Onset   Cancer - Other Mother 52       carcinoid of colon   Diabetes Father    Colon cancer Brother 92   Lung cancer Paternal Aunt        dx >50; smoking hx   Cancer -  Other Maternal Grandfather 54       carcinoid of colon   Esophageal cancer Neg Hx      Physical Exam: General:   Alert,  well-nourished, pleasant and cooperative in NAD Head:  Normocephalic and atraumatic. Eyes:  Sclera clear, no icterus.   Conjunctiva pink. Mouth:  No deformity or lesions.   Neck:  Supple; no masses or thyromegaly. Lungs:  Clear throughout to auscultation.   No wheezes. Heart:  Regular rate and rhythm; no murmurs. Abdomen:  Soft, non-tender, nondistended, normal bowel sounds, no rebound or guarding.  Msk:  Symmetrical. No boney deformities LAD: No inguinal or umbilical LAD Extremities:  No clubbing or edema. Neurologic:  Alert and  oriented x4;  grossly nonfocal Skin:  No obvious rash or bruise. Psych:  Alert and cooperative. Normal mood and affect.     Studies/Results: No results found.    Madelline Eshbach L. Tarri Glenn, MD, MPH 09/07/2022, 9:21 AM

## 2022-09-09 ENCOUNTER — Encounter (HOSPITAL_COMMUNITY): Payer: Self-pay | Admitting: Gastroenterology

## 2022-10-24 NOTE — Assessment & Plan Note (Addendum)
Upon review of records, patient is supposed to be getting annual mammograms.  It appears she was also supposed to be on tamoxifen.  I will Order mammogram and then get patient back oncology.Marland Kitchen

## 2022-10-24 NOTE — Progress Notes (Unsigned)
Subjective:  Patient ID: Nicole Marquez, female    DOB: 04-08-1961  Age: 62 y.o. MRN: [id number]  Chief Complaint  Patient presents with   Diabetes   Hyperlipidemia    HPI   Diabetes type 1: Diagnosed at 62 yo. Taking tresiba 26 units daily. Patient was having low sugars at night so decreased from 28 to 26. This helped lows. , has Dexcom CGM readings range from 90-250, checks feet daily, see's eye doctor every 6 weeks for macular degeneration.  Hypothyroidism: Taking synthroid 88 mcg daily  Hyperlipidemia/Aortic atherosclerosis and iliac athersoclerosis. : Uncontrolled with diet and exercises daily.  GERD: Protonix 40 mg daily  Anxiety: states she has been on treatment in the past but would like to discuss resuming treatment again.     10/25/2022    9:01 AM 04/22/2022    2:01 PM 11/25/2020    2:18 PM 12/30/2019    6:49 PM  Depression screen PHQ 2/9  Decreased Interest 0 1 0 0  Down, Depressed, Hopeless 0 0 0 0  PHQ - 2 Score 0 1 0 0  Altered sleeping  1    Tired, decreased energy  1    Change in appetite  0    Feeling bad or failure about yourself   0    Trouble concentrating  0    Moving slowly or fidgety/restless  0    Suicidal thoughts  0    PHQ-9 Score  3    Difficult doing work/chores  Not difficult at all           05/28/2022    2:09 PM 06/03/2022    2:10 PM 07/19/2022    7:36 AM 09/07/2022    7:24 AM 10/25/2022    9:01 AM  Fall Risk  Falls in the past year?     0  Was there an injury with Fall?     0  Fall Risk Category Calculator     0  (RETIRED) Patient Fall Risk Level Low fall risk Low fall risk High fall risk High fall risk   Patient at Risk for Falls Due to     No Fall Risks  Fall risk Follow up     Falls evaluation completed      Review of Systems  Constitutional:  Negative for chills, fatigue and fever.  HENT:  Negative for congestion, ear pain, rhinorrhea and sore throat.   Respiratory:  Negative for cough and shortness of breath.    Cardiovascular:  Negative for chest pain.  Gastrointestinal:  Negative for abdominal pain, constipation, diarrhea, nausea and vomiting.  Genitourinary:  Negative for dysuria and urgency.  Musculoskeletal:  Negative for back pain and myalgias.  Neurological:  Negative for dizziness, weakness, light-headedness and headaches.  Psychiatric/Behavioral:  Negative for dysphoric mood. The patient is not nervous/anxious.     Current Outpatient Medications on File Prior to Visit  Medication Sig Dispense Refill   Aflibercept (EYLEA IZ) 1 Application by Intravitreal route every 6 (six) weeks. Eye injection     albuterol (VENTOLIN HFA) 108 (90 Base) MCG/ACT inhaler INHALE 1 TO 2 PUFFS EVERY 4 HOURS AS NEEDED FOR WHEEZING 18 each 2   Budesonide (PULMICORT FLEXHALER) 90 MCG/ACT inhaler INHALE 1 PUFF INTO THE LUNGS TWICE A DAY (Patient taking differently: Inhale 1 puff into the lungs daily.) 1 each 2   Continuous Blood Gluc Sensor (DEXCOM G6 SENSOR) MISC CHECK SUGARS BEFORE MEALS AND AT BEDTIME 3 each 3   Continuous Blood Gluc  Transmit (DEXCOM G6 TRANSMITTER) MISC Change transmitter every 3 months DX CODE:  E10.649 1 each 4   insulin degludec (TRESIBA FLEXTOUCH) 200 UNIT/ML FlexTouch Pen ADMINISTER 28 UNITS UNDER THE SKIN DAILY (Patient taking differently: Inject 26 Units into the skin daily.) 3 mL 1   Insulin Pen Needle (BD PEN NEEDLE NANO 2ND GEN) 32G X 4 MM MISC 1 EACH BY DOES NOT APPLY ROUTE 4 (FOUR) TIMES DAILY. 200 each 1   levothyroxine (SYNTHROID) 88 MCG tablet Take 88 mcg by mouth daily before breakfast.     montelukast (SINGULAIR) 10 MG tablet TAKE 1 TABLET BY MOUTH EVERYDAY AT BEDTIME 90 tablet 0   ondansetron (ZOFRAN) 4 MG tablet Take 1 tablet by mouth prior to drinking each dose of colonoscopy prep. 2 tablet 0   pantoprazole (PROTONIX) 40 MG tablet TAKE 1 TABLET BY MOUTH EVERY DAY 90 tablet 3   ramipril (ALTACE) 10 MG capsule Take 1 capsule (10 mg total) by mouth daily. 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   DCIS (ductal carcinoma in situ)    Exudative age-related macular degeneration, bilateral, stage unspecified ([place])    Family history of colon cancer 02/27/2021   Family history of malignant neoplasm of digestive organs    GERD (gastroesophageal reflux disease)    History of hiatal hernia    History of kidney stones    Mixed hyperlipidemia    Other specified hypothyroidism    Type 1 diabetes mellitus with hypoglycemia without coma ([place])    Past Surgical History:  Procedure Laterality Date   BREAST LUMPECTOMY WITH RADIOACTIVE SEED LOCALIZATION Right 05/28/2021   Procedure: RIGHT BREAST LUMPECTOMY WITH RADIOACTIVE SEED LOCALIZATION X2;  Surgeon: [name], MD;  Location: [facility];  Service: General;  Laterality: Right;   CESAREAN SECTION     x3   COLONOSCOPY WITH PROPOFOL N/A 07/19/2022   Procedure: COLONOSCOPY WITH PROPOFOL;  Surgeon: Thornton Park, MD;  [facility];  Service: Gastroenterology;  Laterality: N/A;   COLONOSCOPY WITH PROPOFOL N/A 09/07/2022   Procedure: COLONOSCOPY WITH PROPOFOL;  Surgeon: Thornton Park, MD;  [facility];  Service: Gastroenterology;  Laterality: N/A;  With APC treatment   ENDOMETRIAL ABLATION     HOT HEMOSTASIS N/A 09/07/2022   Procedure: HOT HEMOSTASIS (ARGON PLASMA COAGULATION/BICAP);  Surgeon: Thornton Park, MD;  Location: [facility];  Service: Gastroenterology;  Laterality: N/A;   Right rotator cuff repair      Family History  Problem Relation Age of Onset   Cancer - Other Mother 26       carcinoid of colon   Diabetes Father    Colon cancer Brother 40   Lung cancer Paternal Aunt        dx >50;  smoking hx   Cancer - Other Maternal Grandfather 29       carcinoid of colon   Esophageal cancer Neg Hx    Social History   Socioeconomic History   Marital status: Married    Spouse name: Gwyndolyn Saxon   Number of children: 4   Years of education: 12   Highest education level: 12th grade  Occupational History  Not on file  Tobacco Use   Smoking status: Former    Packs/day: 1.00    Years: 30.00    Total pack years: 30.00    Types: Cigarettes    Quit date: 2008    Years since quitting: 16.1   Smokeless tobacco: Never  Vaping Use   Vaping Use: Never used  Substance and Sexual Activity   Alcohol use: Never   Drug use: Never   Sexual activity: Not Currently  Other Topics Concern   Not on file  Social History Narrative   Patient has 1 set of twins and two individual children.   Social Determinants of Health   Financial Resource Strain: Low Risk  (10/25/2022)   Overall Financial Resource Strain (CARDIA)    Difficulty of Paying Living Expenses: Not hard at all  Food Insecurity: No Food Insecurity (10/25/2022)   Hunger Vital Sign    Worried About Running Out of Food in the Last Year: Never true    Ran Out of Food in the Last Year: Never true  Transportation Needs: No Transportation Needs (10/25/2022)   PRAPARE - Hydrologist (Medical): No    Lack of Transportation (Non-Medical): No  Physical Activity: Insufficiently Active (10/25/2022)   Exercise Vital Sign    Days of Exercise per Week: 3 days    Minutes of Exercise per Session: 30 min  Stress: No Stress Concern Present (10/25/2022)   [place]    Feeling of Stress : Not at all  Social Connections: Moderately Integrated (10/25/2022)   Social Connection and Isolation Panel [NHANES]    Frequency of Communication with Friends and Family: More than three times a week    Frequency of Social Gatherings with Friends and Family: More than  three times a week    Attends Religious Services: More than 4 times per year    Active Member of Clubs or Organizations: No    Attends Archivist Meetings: Never    Marital Status: Married    Objective:  Pulse 94   Ht 5' 1"$  (1.549 m)   Wt 132 lb (59.9 kg)   LMP 04/22/2014 (Exact Date)   SpO2 100%   BMI 24.94 kg/m      10/25/2022    8:50 AM 09/07/2022    9:28 AM 09/07/2022    9:18 AM  BP/Weight  Systolic BP  0000000 [id number]  Diastolic BP  57 85  Wt. (Lbs) 132    BMI 24.94 kg/m2      Physical Exam  Diabetic Foot Exam - Simple   No data filed      Lab Results  Component Value Date   WBC 6.0 09/07/2022   HGB 12.5 09/07/2022   HCT 38.0 09/07/2022   PLT 222 09/07/2022   GLUCOSE 106 (H) 04/22/2022   CHOL 246 (H) 04/22/2022   TRIG 79 04/22/2022   HDL 60 04/22/2022   LDLCALC 173 (H) 04/22/2022   ALT 14 04/22/2022   AST 16 04/22/2022   NA 141 04/22/2022   K 4.6 04/22/2022   CL 103 04/22/2022   CREATININE 0.69 04/22/2022   BUN 16 04/22/2022   CO2 24 04/22/2022   TSH 0.108 (L) 04/22/2022   INR 0.9 05/18/2022   HGBA1C 7.2 (H) 04/22/2022   MICROALBUR 30 11/25/2020      Assessment & Plan:    Other specified hypothyroidism Assessment & Plan: Previously well controlled Continue Synthroid at current dose  Recheck TSH and adjust Synthroid as indicated     Type 1 diabetes mellitus with hypoglycemia without coma (H[facility]) Assessment & Plan: Control:  Recommend check sugars fasting daily. Recommend check feet daily. Recommend annual eye exams. Medicines: Tresiba 26 units daily. Continue to work on eating a healthy diet and exercise.  Labs drawn today.      Mixed hyperlipidemia Assessment & Plan: Well controlled.  No medicines.  Continue to work on eating a healthy diet and exercise.  Labs drawn today.     Ductal carcinoma in situ (DCIS) of right breast Assessment & Plan: Management per specialist.      No orders of the defined types were placed  in this encounter.   No orders of the defined types were placed in this encounter.    Follow-up: No follow-ups on file.   I,Lee-Anne Flicker I Leal-Borjas,acting as a scribe for Rochel Brome, MD.,have documented all relevant documentation on the behalf of Rochel Brome, MD,as directed by  Rochel Brome, MD while in the presence of Rochel Brome, MD.   An After Visit Summary was printed and given to the patient.  Rochel Brome, MD [facility] [phone]

## 2022-10-24 NOTE — Assessment & Plan Note (Signed)
Well controlled.  No medicines.  Continue to work on eating a healthy diet and exercise.  Labs drawn today.

## 2022-10-24 NOTE — Assessment & Plan Note (Signed)
Previously well controlled Continue Synthroid at current dose  Recheck TSH and adjust Synthroid as indicated   

## 2022-10-24 NOTE — Assessment & Plan Note (Signed)
Control: Await labs/testing for assessment and recommendations. Recommend continue cgm use.  Recommend check feet daily. Recommend annual eye exams. Medicines: Tresiba 26 units daily. I am concerned patient needs mealtime insulin.  Continue to work on eating a healthy diet and exercise.  Labs drawn today.

## 2022-10-25 ENCOUNTER — Encounter: Payer: Self-pay | Admitting: Family Medicine

## 2022-10-25 ENCOUNTER — Ambulatory Visit: Payer: BC Managed Care – PPO | Admitting: Family Medicine

## 2022-10-25 VITALS — BP 124/72 | HR 94 | Ht 61.0 in | Wt 132.0 lb

## 2022-10-25 DIAGNOSIS — F410 Panic disorder [episodic paroxysmal anxiety] without agoraphobia: Secondary | ICD-10-CM

## 2022-10-25 DIAGNOSIS — Z1159 Encounter for screening for other viral diseases: Secondary | ICD-10-CM

## 2022-10-25 DIAGNOSIS — R300 Dysuria: Secondary | ICD-10-CM

## 2022-10-25 DIAGNOSIS — T466X5A Adverse effect of antihyperlipidemic and antiarteriosclerotic drugs, initial encounter: Secondary | ICD-10-CM

## 2022-10-25 DIAGNOSIS — E038 Other specified hypothyroidism: Secondary | ICD-10-CM | POA: Diagnosis not present

## 2022-10-25 DIAGNOSIS — Z86000 Personal history of in-situ neoplasm of breast: Secondary | ICD-10-CM

## 2022-10-25 DIAGNOSIS — Z1231 Encounter for screening mammogram for malignant neoplasm of breast: Secondary | ICD-10-CM | POA: Insufficient documentation

## 2022-10-25 DIAGNOSIS — D500 Iron deficiency anemia secondary to blood loss (chronic): Secondary | ICD-10-CM

## 2022-10-25 DIAGNOSIS — I700 Atherosclerosis of aorta: Secondary | ICD-10-CM

## 2022-10-25 DIAGNOSIS — E10649 Type 1 diabetes mellitus with hypoglycemia without coma: Secondary | ICD-10-CM

## 2022-10-25 DIAGNOSIS — R0789 Other chest pain: Secondary | ICD-10-CM | POA: Diagnosis not present

## 2022-10-25 DIAGNOSIS — Z23 Encounter for immunization: Secondary | ICD-10-CM

## 2022-10-25 DIAGNOSIS — E782 Mixed hyperlipidemia: Secondary | ICD-10-CM

## 2022-10-25 DIAGNOSIS — Z114 Encounter for screening for human immunodeficiency virus [HIV]: Secondary | ICD-10-CM

## 2022-10-25 DIAGNOSIS — E1065 Type 1 diabetes mellitus with hyperglycemia: Secondary | ICD-10-CM

## 2022-10-25 DIAGNOSIS — J4520 Mild intermittent asthma, uncomplicated: Secondary | ICD-10-CM

## 2022-10-25 DIAGNOSIS — E7849 Other hyperlipidemia: Secondary | ICD-10-CM

## 2022-10-25 DIAGNOSIS — M791 Myalgia, unspecified site: Secondary | ICD-10-CM

## 2022-10-25 DIAGNOSIS — N3000 Acute cystitis without hematuria: Secondary | ICD-10-CM

## 2022-10-25 DIAGNOSIS — D0511 Intraductal carcinoma in situ of right breast: Secondary | ICD-10-CM

## 2022-10-25 LAB — POCT URINALYSIS DIP (CLINITEK)
Bilirubin, UA: NEGATIVE
Blood, UA: NEGATIVE
Glucose, UA: NEGATIVE mg/dL
Ketones, POC UA: NEGATIVE mg/dL
Nitrite, UA: NEGATIVE
POC PROTEIN,UA: NEGATIVE
Spec Grav, UA: 1.01 (ref 1.010–1.025)
Urobilinogen, UA: 0.2 E.U./dL
pH, UA: 6.5 (ref 5.0–8.0)

## 2022-10-25 MED ORDER — NITROFURANTOIN MONOHYD MACRO 100 MG PO CAPS: 100.0000 mg | ORAL_CAPSULE | Freq: Two times a day (BID) | ORAL | 0 refills | Status: DC

## 2022-10-25 MED ORDER — NEXLIZET 180-10 MG PO TABS: 1.0000 | ORAL_TABLET | Freq: Every day | ORAL | 0 refills | Status: DC

## 2022-10-25 MED ORDER — LORAZEPAM 0.5 MG PO TABS: 0.5000 mg | ORAL_TABLET | Freq: Every day | ORAL | 1 refills | Status: DC | PRN

## 2022-10-25 NOTE — Assessment & Plan Note (Addendum)
EKG:NSR ST changes.  Patient is at high risk for cardiac disease.  Chest tightness consistent with Panic attacks.

## 2022-10-25 NOTE — Patient Instructions (Addendum)
Familial hypercholesterolemia: Started on next visit once daily.  Must follow-up in 3 months for fasting labs and doctor visit.  Labs drawn.  Shingles vaccine and flu shot given.  Start lorazepam 0.5 mg 1 daily as needed for severe panic attacks.  Use with caution.

## 2022-10-25 NOTE — Assessment & Plan Note (Signed)
Intolerant to statin.  ?

## 2022-10-25 NOTE — Assessment & Plan Note (Signed)
Check iron studies.

## 2022-10-25 NOTE — Progress Notes (Signed)
Subjective:  Patient ID: Ronnette Hila, female    DOB: 11/26/1960  Age: 62 y.o. MRN: [id number]  Chief Complaint  Patient presents with   Diabetes   Hyperlipidemia    HPI Familial hyperlipidemia: Patient previously took statins but did not tolerate.  At her last visit, nexlizet was recommended.  Patient has atherosclerosis of her aorta and iliac arteries.  This was noted in 2022.  Recommend aspirin 81 mg daily. Patient eats healthy and exercises.  Diabetes type I: Taking tresiba 26 units, has Dexcom CGM readings range from 90-250, checks feet daily, see's eye doctor every 6 weeks for macular degeneration. Ramipril 10 mg daily for nephropathy.  Patient has significant lows down into the 60s.  She dropped her Tresiba from 20 to 26 units due to this, however she still has highs up to 250.  Macular degeneration: getting shots.   Patient had iron deficiency anemia due to chronic gi blood loss. Patient has had colonoscopy, egd, and has had iron infusions. Feeling better. Needs iron studies rechecked. 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   Asthma: on singulair 10 mg daily, pulmicort one inhalation daily, and albuterol hfa 2 puffs four times a day as needed.    Anxiety: states she has been on treatment in the past but would like to discuss resuming treatment again. Patient is going to therapy.  Patient started having panic attacks which usually occur in her counseling sessions. Has about 2 per week.. Symptoms include diaphoresis, dypnea, and chest tightness. Does not get with exertion. Exercise makes her feel better. Did not tolerate clonazepam due to somnolence.      10/25/2022    9:01 AM 04/22/2022    2:01 PM 11/25/2020    2:18 PM 12/30/2019    6:49 PM  Depression screen PHQ 2/9  Decreased Interest 0 1 0 0  Down,  Depressed, Hopeless 0 0 0 0  PHQ - 2 Score 0 1 0 0  Altered sleeping  1    Tired, decreased energy  1    Change in appetite  0    Feeling bad or failure about yourself   0    Trouble concentrating  0    Moving slowly or fidgety/restless  0    Suicidal thoughts  0    PHQ-9 Score  3    Difficult doing work/chores  Not difficult at all           05/28/2022    2:09 PM 06/03/2022    2:10 PM 07/19/2022    7:36 AM 09/07/2022    7:24 AM 10/25/2022    9:01 AM  Fall Risk  Falls in the past year?     0  Was there an injury with Fall?     0  Fall Risk Category Calculator     0  (RETIRED) Patient Fall Risk Level Low fall risk Low fall risk High fall risk High fall risk   Patient at Risk for Falls Due to     No Fall Risks  Fall risk Follow up     Falls evaluation completed      Review of Systems  Constitutional:  Negative for chills, fatigue and fever.  HENT:  Negative for congestion, ear pain and sore throat.   Respiratory:  Negative for cough and shortness of breath.   Cardiovascular:  Negative for chest pain.  Gastrointestinal:  Negative for abdominal pain, constipation, diarrhea, nausea and vomiting.  Genitourinary:  Negative for dysuria and urgency.  Musculoskeletal:  Negative for arthralgias and myalgias.  Skin:  Negative for rash.  Neurological:  Negative for dizziness and headaches.  Psychiatric/Behavioral:  Negative for dysphoric mood. The patient is not nervous/anxious.     Current Outpatient Medications on File Prior to Visit  Medication Sig Dispense Refill   Aflibercept (EYLEA IZ) 1 Application by Intravitreal route every 6 (six) weeks. Eye injection     albuterol (VENTOLIN HFA) 108 (90 Base) MCG/ACT inhaler INHALE 1 TO 2 PUFFS EVERY 4 HOURS AS NEEDED FOR WHEEZING 18 each 2   Budesonide (PULMICORT FLEXHALER) 90 MCG/ACT inhaler INHALE 1 PUFF INTO THE LUNGS TWICE A DAY (Patient taking differently: Inhale 1 puff into the lungs daily.) 1 each 2   Continuous Blood Gluc Sensor  (DEXCOM G6 SENSOR) MISC CHECK SUGARS BEFORE MEALS AND AT BEDTIME 3 each 3   Continuous Blood Gluc Transmit (DEXCOM G6 TRANSMITTER) MISC Change transmitter every 3 months DX CODE:  E10.649 1 each 4   insulin degludec (TRESIBA FLEXTOUCH) 200 UNIT/ML FlexTouch Pen ADMINISTER 28 UNITS UNDER THE SKIN DAILY (Patient taking differently: Inject 26 Units into the skin daily.) 3 mL 1   Insulin Pen Needle (BD PEN NEEDLE NANO 2ND GEN) 32G X 4 MM MISC 1 EACH BY DOES NOT APPLY ROUTE 4 (FOUR) TIMES DAILY. 200 each 1   levothyroxine (SYNTHROID) 88 MCG tablet Take 88 mcg by mouth daily before breakfast.     montelukast (SINGULAIR) 10 MG tablet TAKE 1 TABLET BY MOUTH EVERYDAY AT BEDTIME 90 tablet 0   ondansetron (ZOFRAN) 4 MG tablet Take 1 tablet by mouth prior to drinking each dose of colonoscopy prep. 2 tablet 0   pantoprazole (PROTONIX) 40 MG tablet TAKE 1 TABLET BY MOUTH EVERY DAY 90 tablet 3   ramipril (ALTACE) 10 MG capsule Take 1 capsule (10 mg total) by mouth daily. 90 capsule 1   Current Facility-Administered Medications on File Prior to Visit  Medication Dose Route Frequency Provider Last Rate Last Admin   0.9 %  sodium chloride infusion  500 mL Intravenous Once Thornton Park, MD       Past Medical History:  Diagnosis Date   Anemia    Asthma    seasonal   Breast cancer ([place]) 2022   right   DCIS (ductal carcinoma in situ)    Exudative age-related macular degeneration, bilateral, stage unspecified ([place])    Family history of colon cancer 02/27/2021   Family history of malignant neoplasm of digestive organs    GERD (gastroesophageal reflux disease)    History of hiatal hernia    History of kidney stones    Mixed hyperlipidemia    Other specified hypothyroidism    Type 1 diabetes mellitus with hypoglycemia without coma ([place])    Past Surgical History:  Procedure Laterality Date   BREAST LUMPECTOMY WITH RADIOACTIVE SEED LOCALIZATION Right 05/28/2021   Procedure: RIGHT BREAST LUMPECTOMY WITH  RADIOACTIVE SEED LOCALIZATION X2;  Surgeon: [name], MD;  Location: [facility];  Service: General;  Laterality: Right;   CESAREAN SECTION     x3   COLONOSCOPY WITH PROPOFOL N/A 07/19/2022   Procedure: COLONOSCOPY WITH PROPOFOL;  Surgeon: Thornton Park, MD;  Location: [facility];  Service: Gastroenterology;  Laterality: N/A;   COLONOSCOPY WITH PROPOFOL N/A 09/07/2022   Procedure: COLONOSCOPY WITH PROPOFOL;  Surgeon: Thornton Park, MD;  Location: [facility];  Service: Gastroenterology;  Laterality: N/A;  With APC treatment   ENDOMETRIAL ABLATION     HOT HEMOSTASIS N/A 09/07/2022   Procedure: HOT HEMOSTASIS (ARGON PLASMA COAGULATION/BICAP);  Surgeon: Thornton Park, MD;  Location: [facility];  Service: Gastroenterology;  Laterality: N/A;   Right rotator cuff repair      Family History  Problem Relation Age of Onset   Cancer - Other Mother 9       carcinoid of colon   Diabetes Father    Colon cancer Brother 8   Lung cancer Paternal Aunt        dx >50; smoking hx   Cancer - Other Maternal Grandfather 38       carcinoid of colon   Esophageal cancer Neg Hx    Social History   Socioeconomic History   Marital status: Married    Spouse name: Gwyndolyn Saxon   Number of children: 4   Years of education: 12   Highest education level: 12th grade  Occupational History   Not on file  Tobacco Use   Smoking status: Former    Packs/day: 1.00    Years: 30.00    Total pack years: 30.00    Types: Cigarettes    Quit date: 2008    Years since quitting: 16.1   Smokeless tobacco: Never  Vaping Use   Vaping Use: Never used  Substance and Sexual Activity   Alcohol use: Never   Drug use: Never   Sexual activity: Not Currently  Other Topics Concern   Not on file  Social History Narrative   Patient has 1 set of twins and two individual children.   Social Determinants of Health   Financial Resource Strain: Low Risk  (10/25/2022)   Overall Financial Resource Strain (CARDIA)     Difficulty of Paying Living Expenses: Not hard at all  Food Insecurity: No Food Insecurity (10/25/2022)   Hunger Vital Sign    Worried About Running Out of Food in the Last Year: Never true    Ran Out of Food in the Last Year: Never true  Transportation Needs: No Transportation Needs (10/25/2022)   PRAPARE - Hydrologist (Medical): No    Lack of Transportation (Non-Medical): No  Physical Activity: Insufficiently Active (10/25/2022)   Exercise Vital Sign    Days of Exercise per Week: 3 days    Minutes of Exercise per Session: 30 min  Stress: No Stress Concern Present (10/25/2022)   [place]    Feeling of Stress : Not at all  Social Connections: Moderately Integrated (10/25/2022)   Social Connection and Isolation Panel [NHANES]    Frequency of Communication with Friends and Family: More than three times a week    Frequency of Social Gatherings with Friends and Family: More than three times a week    Attends Religious Services: More than 4 times per year    Active Member of Clubs or Organizations: No    Attends Archivist Meetings: Never    Marital Status: Married    Objective:  BP 124/72   Pulse 94   Ht 5' 1"$  (1.549 m)   Wt 132 lb (59.9 kg)   LMP 04/22/2014 (Exact Date)   SpO2 100%   BMI 24.94 kg/m      10/25/2022    8:50 AM 09/07/2022    9:28 AM 09/07/2022    9:18 AM  BP/Weight  Systolic BP [id number] [id number]  Diastolic BP  72 57 85  Wt. (Lbs) 132    BMI 24.94 kg/m2      Physical Exam Vitals reviewed.  Constitutional:      Appearance: Normal appearance. She is normal weight.  Neck:     Vascular: No carotid bruit.  Cardiovascular:     Rate and Rhythm: Normal rate and regular rhythm.     Heart sounds: Normal heart sounds.  Pulmonary:     Effort: Pulmonary effort is normal. No respiratory distress.     Breath sounds: Normal breath sounds.  Abdominal:     General:  Abdomen is flat. Bowel sounds are normal.     Palpations: Abdomen is soft.     Tenderness: There is no abdominal tenderness.  Neurological:     Mental Status: She is alert and oriented to person, place, and time.  Psychiatric:        Mood and Affect: Mood normal.        Behavior: Behavior normal.     Diabetic Foot Exam - Simple   Simple Foot Form Diabetic Foot exam was performed with the following findings: Yes 10/25/2022  9:26 PM  Visual Inspection No deformities, no ulcerations, no other skin breakdown bilaterally: Yes Sensation Testing Intact to touch and monofilament testing bilaterally: Yes Pulse Check Posterior Tibialis and Dorsalis pulse intact bilaterally: Yes Comments      Lab Results  Component Value Date   WBC 6.0 09/07/2022   HGB 12.5 09/07/2022   HCT 38.0 09/07/2022   PLT 222 09/07/2022   GLUCOSE 106 (H) 04/22/2022   CHOL 246 (H) 04/22/2022   TRIG 79 04/22/2022   HDL 60 04/22/2022   LDLCALC 173 (H) 04/22/2022   ALT 14 04/22/2022   AST 16 04/22/2022   NA 141 04/22/2022   K 4.6 04/22/2022   CL 103 04/22/2022   CREATININE 0.69 04/22/2022   BUN 16 04/22/2022   CO2 24 04/22/2022   TSH 0.108 (L) 04/22/2022   INR 0.9 05/18/2022   HGBA1C 7.2 (H) 04/22/2022   MICROALBUR 30 11/25/2020      Assessment & Plan:    Other specified hypothyroidism Assessment & Plan: Previously well controlled Continue Synthroid at current dose  Recheck TSH and adjust Synthroid as indicated    Orders: -     TSH  Type 1 diabetes mellitus with hypoglycemia without coma ([place]) Assessment & Plan: Control: Await labs/testing for assessment and recommendations. Recommend continue cgm use.  Recommend check feet daily. Recommend annual eye exams. Medicines: Tresiba 26 units daily. I am concerned patient needs mealtime insulin.  Continue to work on eating a healthy diet and exercise.  Labs drawn today.      Familial hyperlipidemia Assessment & Plan: Poorly controlled.   Start nexlizet once daily.  Continue to work on eating a healthy diet and exercise.  Labs drawn today.    Orders: -     Comprehensive metabolic panel -     Lipid panel -     Nexlizet; Take 1 tablet by mouth daily.  Dispense: 90 tablet; Refill: 0  History of ductal carcinoma in situ (DCIS) of breast Assessment & Plan: Upon review of records, patient is supposed to be getting annual mammograms.  It appears she was also supposed to be on tamoxifen.  I will Order mammogram and then get patient back oncology..    Encounter for screening mammogram for malignant neoplasm of breast -     Digital Screening Mammogram, Left and Right; Future  Chest tightness Assessment & Plan:  EKG:NSR ST changes.  Patient is at high risk for cardiac disease.  Chest tightness consistent with Panic attacks.   Orders: -     EKG 12-Lead  Iron deficiency anemia due to chronic blood loss Assessment & Plan: Check iron studies.   Orders: -     Iron, TIBC and Ferritin Panel  Encounter for hepatitis C screening test for low risk patient -     HCV Ab w Reflex to Quant PCR  Encounter for screening for HIV -     HIV Antibody (routine testing w rflx)  Flu vaccine need -     Flu Vaccine MDCK QUAD PF  Need for shingles vaccine -     Varicella-zoster vaccine IM  Dysuria Assessment & Plan: Macrobid prescription.   Orders: -     POCT URINALYSIS DIP (CLINITEK) -     Nitrofurantoin Monohyd Macro; Take 1 capsule (100 mg total) by mouth 2 (two) times daily.  Dispense: 14 capsule; Refill: 0 -     Urine Culture  Uncontrolled type 1 diabetes mellitus with hyperglycemia, with long-term current use of insulin (HCC) Assessment & Plan: Control: Await labs/testing for assessment and recommendations. Recommend continue cgm use.  Recommend check feet daily. Recommend annual eye exams. Medicines: Tresiba 26 units daily. I am concerned patient needs mealtime insulin.  Continue to work on eating a healthy diet and  exercise.  Labs drawn today.     Orders: -     CBC with Differential/Platelet -     Hemoglobin A1c -     Microalbumin / creatinine urine ratio  Panic disorder Assessment & Plan: Prescription: Lorazepam 0.5 mg once daily as needed panic attacks.   Orders: -     LORazepam; Take 1 tablet (0.5 mg total) by mouth daily as needed (panic attacks).  Dispense: 30 tablet; Refill: 1  Aortic atherosclerosis ([place]) Assessment & Plan: Start on nexlizet once daily.  Recommend start aspirin 81 mg daily.    Acute cystitis without hematuria Assessment & Plan: Macrobid prescription.    Myalgia due to statin Assessment & Plan: Intolerant to statin   Mild intermittent asthma without complication Assessment & Plan: Well controlled.  Continue singulair, pulmicort, and albuterol        Meds ordered this encounter  Medications   DISCONTD: Bempedoic Acid-Ezetimibe (NEXLIZET) 180-10 MG TABS    Sig: Take 1 tablet by mouth daily.    Dispense:  20 tablet    Refill:  0   Bempedoic Acid-Ezetimibe (NEXLIZET) 180-10 MG TABS    Sig: Take 1 tablet by mouth daily.    Dispense:  90 tablet    Refill:  0   LORazepam (ATIVAN) 0.5 MG tablet    Sig: Take 1 tablet (0.5 mg total) by mouth daily as needed (panic attacks).    Dispense:  30 tablet    Refill:  1   nitrofurantoin, macrocrystal-monohydrate, (MACROBID) 100 MG capsule    Sig: Take 1 capsule (100 mg total) by mouth 2 (two) times daily.    Dispense:  14 capsule    Refill:  0    Orders Placed This Encounter  Procedures   Urine Culture   MM DIGITAL SCREENING BILATERAL   Varicella-zoster vaccine IM   Flu Vaccine MDCK QUAD PF   CBC with Differential/Platelet   Comprehensive metabolic panel   Hemoglobin A1c   Lipid panel   TSH   Microalbumin / creatinine urine ratio   Iron, TIBC and Ferritin Panel   HCV Ab w Reflex  to Quant PCR   HIV Antibody (routine testing w rflx)   POCT URINALYSIS DIP (CLINITEK)   EKG 12-Lead     Follow-up:  Return in about 3 months (around 01/23/2023) for chronic fasting, 40 minutes please.  Total time spent on today's visit was greater than 40 minutes, including both face-to-face time and nonface-to-face time personally spent on review of chart (labs and imaging), discussing labs and goals, discussing further work-up, treatment options, referrals to specialist if needed, reviewing outside records of pertinent, answering patient's questions, and coordinating care.   Thompson Caul, CMA, acting as a scribe for Rochel Brome, MD.,have documented all relevant documentation on the behalf of Rochel Brome, MD,as directed by  Rochel Brome, MD while in the presence of Rochel Brome, MD.   An After Visit Summary was printed and given to the patient. I attest that I have reviewed this visit and agree with the plan scribed by my staff.   Rochel Brome, MD [facility] [phone]

## 2022-10-25 NOTE — Assessment & Plan Note (Signed)
Macrobid prescription.

## 2022-10-25 NOTE — Assessment & Plan Note (Signed)
Well controlled.  Continue singulair, pulmicort, and albuterol

## 2022-10-25 NOTE — Assessment & Plan Note (Signed)
Prescription: Lorazepam 0.5 mg once daily as needed panic attacks.

## 2022-10-25 NOTE — Assessment & Plan Note (Signed)
Start on nexlizet once daily.  Recommend start aspirin 81 mg daily.

## 2022-10-27 ENCOUNTER — Other Ambulatory Visit: Payer: Self-pay | Admitting: Family Medicine

## 2022-10-27 LAB — COMPREHENSIVE METABOLIC PANEL
ALT: 18 IU/L (ref 0–32)
AST: 22 IU/L (ref 0–40)
Albumin/Globulin Ratio: 2.1 (ref 1.2–2.2)
Albumin: 4.7 g/dL (ref 3.9–4.9)
Alkaline Phosphatase: 93 IU/L (ref 44–121)
BUN/Creatinine Ratio: 22 (ref 12–28)
BUN: 15 mg/dL (ref 8–27)
Bilirubin Total: <0.2 mg/dL (ref 0.0–1.2)
CO2: 22 mmol/L (ref 20–29)
Calcium: 10 mg/dL (ref 8.7–10.3)
Chloride: 103 mmol/L (ref 96–106)
Creatinine, Ser: 0.67 mg/dL (ref 0.57–1.00)
Globulin, Total: 2.2 g/dL (ref 1.5–4.5)
Glucose: 57 mg/dL — ABNORMAL LOW (ref 70–99)
Potassium: 4.7 mmol/L (ref 3.5–5.2)
Sodium: 144 mmol/L (ref 134–144)
Total Protein: 6.9 g/dL (ref 6.0–8.5)
eGFR: 98 mL/min/{1.73_m2} (ref >59)

## 2022-10-27 LAB — CARDIOVASCULAR RISK ASSESSMENT

## 2022-10-27 LAB — CBC WITH DIFFERENTIAL/PLATELET
Basophils Absolute: 0.1 10*3/uL (ref 0.0–0.2)
Basos: 2 %
EOS (ABSOLUTE): 0.1 10*3/uL (ref 0.0–0.4)
Eos: 2 %
Hematocrit: 44.4 % (ref 34.0–46.6)
Hemoglobin: 14.6 g/dL (ref 11.1–15.9)
Immature Grans (Abs): 0 10*3/uL (ref 0.0–0.1)
Immature Granulocytes: 0 %
Lymphocytes Absolute: 2.9 10*3/uL (ref 0.7–3.1)
Lymphs: 49 %
MCH: 28.6 pg (ref 26.6–33.0)
MCHC: 32.9 g/dL (ref 31.5–35.7)
MCV: 87 fL (ref 79–97)
Monocytes Absolute: 0.4 10*3/uL (ref 0.1–0.9)
Monocytes: 7 %
Neutrophils Absolute: 2.4 10*3/uL (ref 1.4–7.0)
Neutrophils: 40 %
Platelets: 276 10*3/uL (ref 150–450)
RBC: 5.11 x10E6/uL (ref 3.77–5.28)
RDW: 11.9 % (ref 11.7–15.4)
WBC: 6 10*3/uL (ref 3.4–10.8)

## 2022-10-27 LAB — IRON,TIBC AND FERRITIN PANEL
Ferritin: 78 ng/mL (ref 15–150)
Iron Saturation: 20 % (ref 15–55)
Iron: 67 ug/dL (ref 27–139)
Total Iron Binding Capacity: 329 ug/dL (ref 250–450)
UIBC: 262 ug/dL (ref 118–369)

## 2022-10-27 LAB — LIPID PANEL
Chol/HDL Ratio: 4.6 ratio — ABNORMAL HIGH (ref 0.0–4.4)
Cholesterol, Total: 305 mg/dL — ABNORMAL HIGH (ref 100–199)
HDL: 66 mg/dL (ref >39)
LDL Chol Calc (NIH): 220 mg/dL — ABNORMAL HIGH (ref 0–99)
Triglycerides: 111 mg/dL (ref 0–149)
VLDL Cholesterol Cal: 19 mg/dL (ref 5–40)

## 2022-10-27 LAB — HCV INTERPRETATION

## 2022-10-27 LAB — HEMOGLOBIN A1C
Est. average glucose Bld gHb Est-mCnc: 163 mg/dL
Hgb A1c MFr Bld: 7.3 % — ABNORMAL HIGH (ref 4.8–5.6)

## 2022-10-27 LAB — URINE CULTURE: Organism ID, Bacteria: NO GROWTH

## 2022-10-27 LAB — TSH: TSH: 0.047 u[IU]/mL — ABNORMAL LOW (ref 0.450–4.500)

## 2022-10-27 LAB — HCV AB W REFLEX TO QUANT PCR: HCV Ab: NONREACTIVE

## 2022-10-27 LAB — MICROALBUMIN / CREATININE URINE RATIO
Creatinine, Urine: 45.1 mg/dL
Microalb/Creat Ratio: 62 mg/g creat — ABNORMAL HIGH (ref 0–29)
Microalbumin, Urine: 27.9 ug/mL

## 2022-10-27 LAB — HIV ANTIBODY (ROUTINE TESTING W REFLEX): HIV Screen 4th Generation wRfx: NONREACTIVE

## 2022-10-27 NOTE — Progress Notes (Signed)
1. Blood count normal. Iron studies normal.  2. Liver function and Kidney function normal.  3. Hepatitis C negative and HIV screening negative. 4. No growth in urine culture.  5. Thyroid function Abnormal. Decrease levothyroxine to 75 mcg once daily in am.  Recheck tsh, free T4 in 8 weeks. 6. Cholesterol: t chol 305, LDL 220.  VERY HIGH. Stated on nexlizet.  7. HBA1C: 7.3. I am concerned that patient is having too many swings in his sugars from way too low to too high.  8. Recommend decrease tresiba 24 U daily.  9. Recommend start preprandial (before meals) insulin FIASP  Check sugars before meals and dose FIASP as below.  Increase to 4 U prior to breakfast, lunch, and supper.      + 1 u if sugar is greater than 200.     + 2 u if sugar between 201-250     + 3 U if sugar between 251-300     + 4 U if sugar between 301-350     + 5 U if sugar between 351-400     + 6 U if sugar > 400.    10  Spilling protein in urine. Recommend kerendia 10 mg daily.

## 2022-11-02 ENCOUNTER — Other Ambulatory Visit: Payer: Self-pay

## 2022-11-02 DIAGNOSIS — E10649 Type 1 diabetes mellitus with hypoglycemia without coma: Secondary | ICD-10-CM

## 2022-11-02 DIAGNOSIS — E038 Other specified hypothyroidism: Secondary | ICD-10-CM

## 2022-11-02 MED ORDER — LEVOTHYROXINE SODIUM 75 MCG PO TABS: 75.0000 ug | ORAL_TABLET | Freq: Every day | ORAL | 2 refills | Status: DC

## 2022-11-02 MED ORDER — TRESIBA FLEXTOUCH 200 UNIT/ML ~~LOC~~ SOPN: PEN_INJECTOR | SUBCUTANEOUS | 1 refills | Status: DC

## 2022-11-02 MED ORDER — KERENDIA 10 MG PO TABS: 10.0000 mg | ORAL_TABLET | Freq: Every day | ORAL | 2 refills | Status: DC

## 2022-11-02 NOTE — Progress Notes (Signed)
Patient was notified of medication changes.  She has short acting insulin on hand.  She was given the new sliding scale guidelines.

## 2022-11-22 ENCOUNTER — Telehealth: Payer: Self-pay

## 2022-11-22 NOTE — Telephone Encounter (Signed)
PA for nexlizet denied via covermymeds.

## 2022-11-25 ENCOUNTER — Other Ambulatory Visit: Payer: Self-pay

## 2022-11-25 DIAGNOSIS — E10649 Type 1 diabetes mellitus with hypoglycemia without coma: Secondary | ICD-10-CM

## 2022-11-25 MED ORDER — DEXCOM G6 SENSOR MISC: 3 refills | Status: DC

## 2022-12-13 ENCOUNTER — Other Ambulatory Visit: Payer: Self-pay

## 2022-12-13 DIAGNOSIS — J4521 Mild intermittent asthma with (acute) exacerbation: Secondary | ICD-10-CM

## 2022-12-13 MED ORDER — MONTELUKAST SODIUM 10 MG PO TABS: ORAL_TABLET | ORAL | 0 refills | Status: DC

## 2022-12-13 MED ORDER — PULMICORT FLEXHALER 90 MCG/ACT IN AEPB: INHALATION_SPRAY | RESPIRATORY_TRACT | 2 refills | Status: DC

## 2022-12-22 ENCOUNTER — Other Ambulatory Visit: Payer: Self-pay | Admitting: Family Medicine

## 2022-12-22 DIAGNOSIS — J4521 Mild intermittent asthma with (acute) exacerbation: Secondary | ICD-10-CM

## 2022-12-23 ENCOUNTER — Other Ambulatory Visit: Payer: Self-pay

## 2022-12-23 DIAGNOSIS — F410 Panic disorder [episodic paroxysmal anxiety] without agoraphobia: Secondary | ICD-10-CM

## 2022-12-23 MED ORDER — LORAZEPAM 0.5 MG PO TABS: 0.5000 mg | ORAL_TABLET | Freq: Every day | ORAL | 1 refills | Status: DC | PRN

## 2023-01-21 ENCOUNTER — Telehealth: Payer: Self-pay

## 2023-01-24 ENCOUNTER — Other Ambulatory Visit: Payer: Self-pay

## 2023-01-24 ENCOUNTER — Telehealth: Payer: Self-pay

## 2023-01-24 DIAGNOSIS — E10649 Type 1 diabetes mellitus with hypoglycemia without coma: Secondary | ICD-10-CM

## 2023-01-24 MED ORDER — TRESIBA FLEXTOUCH 200 UNIT/ML ~~LOC~~ SOPN: PEN_INJECTOR | SUBCUTANEOUS | 1 refills | Status: DC

## 2023-01-24 NOTE — Telephone Encounter (Signed)
PA created, submitted and approved via covermymeds for dexcom sensors.

## 2023-01-27 ENCOUNTER — Ambulatory Visit: Payer: BC Managed Care – PPO | Admitting: Family Medicine

## 2023-01-27 ENCOUNTER — Encounter: Payer: Self-pay | Admitting: Family Medicine

## 2023-01-27 ENCOUNTER — Telehealth: Payer: Self-pay | Admitting: Family Medicine

## 2023-01-27 VITALS — BP 106/56 | HR 88 | Temp 96.9°F | Resp 14 | Ht 61.0 in | Wt 137.0 lb

## 2023-01-27 DIAGNOSIS — J4521 Mild intermittent asthma with (acute) exacerbation: Secondary | ICD-10-CM

## 2023-01-27 DIAGNOSIS — J4520 Mild intermittent asthma, uncomplicated: Secondary | ICD-10-CM

## 2023-01-27 DIAGNOSIS — E1065 Type 1 diabetes mellitus with hyperglycemia: Secondary | ICD-10-CM

## 2023-01-27 DIAGNOSIS — Z800 Family history of malignant neoplasm of digestive organs: Secondary | ICD-10-CM | POA: Diagnosis not present

## 2023-01-27 DIAGNOSIS — E10649 Type 1 diabetes mellitus with hypoglycemia without coma: Secondary | ICD-10-CM

## 2023-01-27 DIAGNOSIS — Z23 Encounter for immunization: Secondary | ICD-10-CM

## 2023-01-27 DIAGNOSIS — E038 Other specified hypothyroidism: Secondary | ICD-10-CM | POA: Diagnosis not present

## 2023-01-27 DIAGNOSIS — F410 Panic disorder [episodic paroxysmal anxiety] without agoraphobia: Secondary | ICD-10-CM

## 2023-01-27 DIAGNOSIS — D509 Iron deficiency anemia, unspecified: Secondary | ICD-10-CM

## 2023-01-27 DIAGNOSIS — E782 Mixed hyperlipidemia: Secondary | ICD-10-CM

## 2023-01-27 NOTE — Progress Notes (Signed)
Subjective:  Patient ID: Nicole Marquez, female    DOB: [birth year]  Age: 62 y.o. MRN: [id number]  Chief Complaint  Patient presents with   Medical Management of Chronic Issues    HPI  Familial hyperlipidemia: Patient previously took statins but did not tolerate.  Nexlizet was recommended but not covered by insurance.  Patient has atherosclerosis of her aorta and iliac arteries.  This was noted in 2022.  Recommend aspirin 81 mg daily. Patient eats healthy and exercises.  Diabetes type I: Taking tresiba 24 units, Novolog sliding scale 2-5 units twice daily with largest has Dexcom CGM readings range from 50-250 checks feet daily, see's eye doctor every 6 weeks for macular degeneration. Ramipril 10 mg daily for nephropathy.  Patient has occasional lows down into the 60s.   Tresiba 24 units due to this, however she still has highs up to 250.  Macular degeneration: getting shots.   Patient had iron deficiency anemia due to chronic gi blood loss. Patient has had colonoscopy, egd, and has had iron infusions. Feeling better. Needs iron studies rechecked. 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    GERD: Protonix 40 mg daily - controls gerd.   Asthma: on singulair 10 mg daily, pulmicort one inhalation daily, and albuterol hfa 2 puffs four times a day as needed.    Anxiety: states she has been on treatment in the past but would like to discuss resuming treatment again. Patient is going to therapy.  Patient started having panic attacks which usually occur in her counseling sessions. Has about 2 per week.. Symptoms include diaphoresis, dypnea, and chest tightness. Does not get with exertion. Exercise makes her feel better. Did not tolerate clonazepam due to somnolence.      10/25/2022    9:01 AM 04/22/2022    2:01 PM 11/25/2020    2:18 PM 12/30/2019    6:49 PM        01/27/2023    10:34 AM 10/25/2022    9:01 AM 04/22/2022    2:01 PM 11/25/2020    2:18 PM 12/30/2019    6:49 PM  Depression screen PHQ 2/9  Decreased Interest 0 0 1 0 0  Down, Depressed, Hopeless 0 0 0 0 0  PHQ - 2 Score 0 0 1 0 0  Altered sleeping 0  1    Tired, decreased energy 1  1    Change in appetite 0  0    Feeling bad or failure about yourself  0  0    Trouble concentrating 0  0    Moving slowly or fidgety/restless 0  0    Suicidal thoughts 0  0    PHQ-9 Score 1  3    Difficult doing work/chores Not difficult at all  Not difficult at all          01/27/2023   10:34 AM  Fall Risk   Falls in the past year? 0  Number falls in past yr: 0  Injury with Fall? 0  Risk for fall due to : No Fall Risks  Follow up Falls evaluation completed;Falls prevention discussed    Patient Care Team: Blane Ohara, MD as PCP - General (Family Medicine) Pershing Proud, RN as Oncology Nurse Navigator Donnelly Angelica, RN as Oncology Nurse Navigator Almond Lint, MD as Consulting Physician (General Surgery) Serena Croissant, MD as Consulting Physician (Hematology and Oncology) Dorothy Puffer, MD  as Consulting Physician (Radiation Oncology)   Review of Systems  Constitutional:  Negative for chills, fatigue and fever.  HENT:  Positive for congestion and sneezing. Negative for rhinorrhea and sore throat.   Respiratory:  Negative for cough and shortness of breath.   Cardiovascular:  Negative for chest pain.  Gastrointestinal:  Negative for abdominal pain, constipation, diarrhea, nausea and vomiting.  Genitourinary:  Negative for dysuria and urgency.  Musculoskeletal:  Negative for back pain and myalgias.  Neurological:  Negative for dizziness, weakness, light-headedness and headaches.  Psychiatric/Behavioral:  Negative for dysphoric mood. The patient is not nervous/anxious.     Current Outpatient Medications on File Prior to Visit  Medication Sig Dispense Refill   insulin aspart (NOVOLOG) 100 UNIT/ML  injection Inject 2-5 Units into the skin 3 (three) times daily with meals.     Aflibercept (EYLEA IZ) 1 Application by Intravitreal route every 6 (six) weeks. Eye injection     Continuous Blood Gluc Sensor (DEXCOM G6 SENSOR) MISC CHECK SUGARS BEFORE MEALS AND AT BEDTIME 3 each 3   Continuous Blood Gluc Transmit (DEXCOM G6 TRANSMITTER) MISC Change transmitter every 3 months DX CODE:  E10.649 1 each 4   Insulin Pen Needle (BD PEN NEEDLE NANO 2ND GEN) 32G X 4 MM MISC 1 EACH BY DOES NOT APPLY ROUTE 4 (FOUR) TIMES DAILY. 200 each 1   Current Facility-Administered Medications on File Prior to Visit  Medication Dose Route Frequency Provider Last Rate Last Admin   0.9 %  sodium chloride infusion  500 mL Intravenous Once Tressia Danas, MD       Past Medical History:  Diagnosis Date   Anemia    Asthma    seasonal   Breast cancer (HCC) 2022   right   DCIS (ductal carcinoma in situ)    Exudative age-related macular degeneration, bilateral, stage unspecified (HCC)    Family history of colon cancer 02/27/2021   Family history of malignant neoplasm of digestive organs    GERD (gastroesophageal reflux disease)    History of hiatal hernia    History of kidney stones    Mixed hyperlipidemia    Other specified hypothyroidism    Type 1 diabetes mellitus with hypoglycemia without coma (HCC)    Past Surgical History:  Procedure Laterality Date   BREAST LUMPECTOMY WITH RADIOACTIVE SEED LOCALIZATION Right 05/28/2021   Procedure: RIGHT BREAST LUMPECTOMY WITH RADIOACTIVE SEED LOCALIZATION X2;  Surgeon: Almond Lint, MD;  Location: [facility];  Service: General;  Laterality: Right;   CESAREAN SECTION     x3   COLONOSCOPY WITH PROPOFOL N/A 07/19/2022   Procedure: COLONOSCOPY WITH PROPOFOL;  Surgeon: Tressia Danas, MD;  Location: [facility];  Service: Gastroenterology;  Laterality: N/A;   COLONOSCOPY WITH PROPOFOL N/A 09/07/2022   Procedure: COLONOSCOPY WITH PROPOFOL;  Surgeon: Tressia Danas, MD;   Location: [facility];  Service: Gastroenterology;  Laterality: N/A;  With APC treatment   ENDOMETRIAL ABLATION     HOT HEMOSTASIS N/A 09/07/2022   Procedure: HOT HEMOSTASIS (ARGON PLASMA COAGULATION/BICAP);  Surgeon: Tressia Danas, MD;  Location: [facility];  Service: Gastroenterology;  Laterality: N/A;   Right rotator cuff repair      Family History  Problem Relation Age of Onset   Cancer - Other Mother 46       carcinoid of colon   Diabetes Father    Colon cancer Brother 90   Lung cancer Paternal Aunt        dx >50; smoking hx   Cancer -  Other Maternal Grandfather 44       carcinoid of colon   Esophageal cancer Neg Hx    Social History   Socioeconomic History   Marital status: Married    Spouse name: Chrissie Noa   Number of children: 4   Years of education: 12   Highest education level: 12th grade  Occupational History   Not on file  Tobacco Use   Smoking status: Former    Packs/day: 1.00    Years: 30.00    Additional pack years: 0.00    Total pack years: 30.00    Types: Cigarettes    Quit date: 2008    Years since quitting: 16.4   Smokeless tobacco: Never  Vaping Use   Vaping Use: Never used  Substance and Sexual Activity   Alcohol use: Never   Drug use: Never   Sexual activity: Not Currently  Other Topics Concern   Not on file  Social History Narrative   Patient has 1 set of twins and two individual children.   Social Determinants of Health   Financial Resource Strain: Low Risk  (10/25/2022)   Overall Financial Resource Strain (CARDIA)    Difficulty of Paying Living Expenses: Not hard at all  Food Insecurity: No Food Insecurity (10/25/2022)   Hunger Vital Sign    Worried About Running Out of Food in the Last Year: Never true    Ran Out of Food in the Last Year: Never true  Transportation Needs: No Transportation Needs (10/25/2022)   PRAPARE - Administrator, Civil Service (Medical): No    Lack of Transportation (Non-Medical): No  Physical  Activity: Insufficiently Active (10/25/2022)   Exercise Vital Sign    Days of Exercise per Week: 3 days    Minutes of Exercise per Session: 30 min  Stress: No Stress Concern Present (10/25/2022)   [employer] of Occupational Health - Occupational Stress Questionnaire    Feeling of Stress : Not at all  Social Connections: Moderately Integrated (10/25/2022)   Social Connection and Isolation Panel [NHANES]    Frequency of Communication with Friends and Family: More than three times a week    Frequency of Social Gatherings with Friends and Family: More than three times a week    Attends Religious Services: More than 4 times per year    Active Member of [employer] or Organizations: No    Attends Banker Meetings: Never    Marital Status: Married    Objective:  BP (!) 106/56   Pulse 88   Temp (!) 96.9 F (36.1 C)   Resp 14   Ht 5\' 1"  (1.549 m)   Wt 137 lb (62.1 kg)   LMP 04/22/2014 (Exact Date)   BMI 25.89 kg/m      01/27/2023   10:31 AM 10/25/2022    8:50 AM 09/07/2022    9:28 AM  BP/Weight  Systolic BP 106 124 137  Diastolic BP 56 72 57  Wt. (Lbs) 137 132   BMI 25.89 kg/m2 24.94 kg/m2     Physical Exam Vitals reviewed.  Constitutional:      Appearance: Normal appearance. She is normal weight.  Neck:     Vascular: No carotid bruit.  Cardiovascular:     Rate and Rhythm: Normal rate and regular rhythm.     Heart sounds: Normal heart sounds.  Pulmonary:     Effort: Pulmonary effort is normal. No respiratory distress.     Breath sounds: Normal breath sounds.  Abdominal:  General: Abdomen is flat. Bowel sounds are normal.     Palpations: Abdomen is soft.     Tenderness: There is no abdominal tenderness.  Neurological:     Mental Status: She is alert and oriented to person, place, and time.  Psychiatric:        Mood and Affect: Mood normal.        Behavior: Behavior normal.     Diabetic Foot Exam - Simple   Simple Foot Form Diabetic Foot exam was  performed with the following findings: Yes 01/27/2023 10:43 AM  Visual Inspection No deformities, no ulcerations, no other skin breakdown bilaterally: Yes Sensation Testing Intact to touch and monofilament testing bilaterally: Yes Pulse Check Posterior Tibialis and Dorsalis pulse intact bilaterally: Yes Comments      Lab Results  Component Value Date   WBC 5.0 01/27/2023   HGB 13.8 01/27/2023   HCT 42.3 01/27/2023   PLT 265 01/27/2023   GLUCOSE 105 (H) 01/27/2023   CHOL 298 (H) 01/27/2023   TRIG 125 01/27/2023   HDL 61 01/27/2023   LDLCALC 215 (H) 01/27/2023   ALT 15 01/27/2023   AST 21 01/27/2023   NA 144 01/27/2023   K 4.9 01/27/2023   CL 102 01/27/2023   CREATININE 0.79 01/27/2023   BUN 18 01/27/2023   CO2 26 01/27/2023   TSH 0.307 (L) 01/27/2023   INR 0.9 05/18/2022   HGBA1C 6.8 (H) 01/27/2023   MICROALBUR 30 11/25/2020      Assessment & Plan:    Other specified hypothyroidism Assessment & Plan: Previously well controlled Continue Synthroid at current dose  Recheck TSH and adjust Synthroid as indicated    Orders: -     TSH  Type 1 diabetes mellitus with hypoglycemia without coma (HCC) -     CBC with Differential/Platelet -     Comprehensive metabolic panel -     Hemoglobin A1c -     Lipid panel -     Evaristo Bury FlexTouch; Inject 24 Units into the skin daily. ADMINISTER 28 UNITS UNDER THE SKIN DAILY  Dispense: 15 mL; Refill: 0 -     Ramipril; Take 1 capsule (10 mg total) by mouth daily.  Dispense: 90 capsule; Refill: 1 -     Microalbumin / creatinine urine ratio  Family history of colon cancer -     Pantoprazole Sodium; Take 1 tablet (40 mg total) by mouth daily.  Dispense: 90 tablet; Refill: 3  Mild intermittent asthma with exacerbation Assessment & Plan: Continue albuterol hfa.  Orders: -     Pulmicort Flexhaler; INHALE 1 PUFF INTO THE LUNGS TWICE A DAY  Dispense: 1 each; Refill: 2 -     Montelukast Sodium; Take 1 tablet daily at bedtime  Dispense:  90 tablet; Refill: 0 -     Albuterol Sulfate HFA; INHALE 1 TO 2 PUFFS EVERY 4 HOURS AS NEEDED FOR WHEEZING  Dispense: 18 each; Refill: 2  Panic disorder Assessment & Plan: Continue current treatment. Exercise for stress relief.  Orders: -     LORazepam; Take 1 tablet (0.5 mg total) by mouth daily as needed (panic attacks).  Dispense: 30 tablet; Refill: 1  Need for shingles vaccine Assessment & Plan: Shingrix vaccine #2 given.  Orders: -     Varicella-zoster vaccine IM -     Rosuvastatin Calcium; Take 1 tablet (10 mg total) by mouth daily.  Dispense: 90 tablet; Refill: 0  Mixed hyperlipidemia Assessment & Plan: Check lipid. Begin crestor 10 mg daily. Co-Q-10  200 mg dail. Healthy diet and exercise.     Uncontrolled type 1 diabetes mellitus with hyperglycemia, with long-term current use of insulin (HCC) Assessment & Plan: At goal. Continue Tresiba 24 units daily. Novolog sliding scale 2-5 units twice daily with largest meal.  Continue continuous glucose monitor.  Go to eye doctor annually. Check feet daily.  Labs drawn.    Mild intermittent asthma without complication Assessment & Plan: Continue albuterol hfa.      Meds ordered this encounter  Medications   pantoprazole (PROTONIX) 40 MG tablet    Sig: Take 1 tablet (40 mg total) by mouth daily.    Dispense:  90 tablet    Refill:  3   Budesonide (PULMICORT FLEXHALER) 90 MCG/ACT inhaler    Sig: INHALE 1 PUFF INTO THE LUNGS TWICE A DAY    Dispense:  1 each    Refill:  2   insulin degludec (TRESIBA FLEXTOUCH) 200 UNIT/ML FlexTouch Pen    Sig: Inject 24 Units into the skin daily. ADMINISTER 28 UNITS UNDER THE SKIN DAILY    Dispense:  15 mL    Refill:  0   LORazepam (ATIVAN) 0.5 MG tablet    Sig: Take 1 tablet (0.5 mg total) by mouth daily as needed (panic attacks).    Dispense:  30 tablet    Refill:  1   montelukast (SINGULAIR) 10 MG tablet    Sig: Take 1 tablet daily at bedtime    Dispense:  90 tablet     Refill:  0   albuterol (VENTOLIN HFA) 108 (90 Base) MCG/ACT inhaler    Sig: INHALE 1 TO 2 PUFFS EVERY 4 HOURS AS NEEDED FOR WHEEZING    Dispense:  18 each    Refill:  2   ramipril (ALTACE) 10 MG capsule    Sig: Take 1 capsule (10 mg total) by mouth daily.    Dispense:  90 capsule    Refill:  1   rosuvastatin (CRESTOR) 10 MG tablet    Sig: Take 1 tablet (10 mg total) by mouth daily.    Dispense:  90 tablet    Refill:  0    Orders Placed This Encounter  Procedures   Zoster Recombinant (Shingrix )   CBC with Differential/Platelet   Comprehensive metabolic panel   Hemoglobin A1c   Lipid panel   TSH   Microalbumin/Creatinine Ratio, Urine     Follow-up: Return in about 3 months (around 04/29/2023) for fasting, chronic follow up, cpe.   I,Carolyn M Morrison,acting as a scribe for Blane Ohara, MD.,have documented all relevant documentation on the behalf of Blane Ohara, MD,as directed by  Blane Ohara, MD while in the presence of Blane Ohara, MD.   An After Visit Summary was printed and given to the patient.  I attest that I have reviewed this visit and agree with the plan scribed by my staff.   Blane Ohara, MD [facility] [phone]

## 2023-01-27 NOTE — Assessment & Plan Note (Signed)
Continue current treatment. Exercise for stress relief.

## 2023-01-27 NOTE — Assessment & Plan Note (Addendum)
Previously well controlled Continue Synthroid at current dose  Recheck TSH and adjust Synthroid as indicated   

## 2023-01-27 NOTE — Assessment & Plan Note (Signed)
Shingrix vaccine #2 given.

## 2023-01-27 NOTE — Assessment & Plan Note (Addendum)
Check lipid. Begin crestor 10 mg daily. Co-Q-10 200 mg dail. Healthy diet and exercise.

## 2023-01-27 NOTE — Assessment & Plan Note (Addendum)
At goal. Continue Tresiba 24 units daily. Novolog sliding scale 2-5 units twice daily with largest meal.  Continue continuous glucose monitor.  Go to eye doctor annually. Check feet daily.  Labs drawn.

## 2023-01-28 ENCOUNTER — Other Ambulatory Visit: Payer: Self-pay | Admitting: Family Medicine

## 2023-01-28 DIAGNOSIS — E038 Other specified hypothyroidism: Secondary | ICD-10-CM

## 2023-01-28 LAB — CBC WITH DIFFERENTIAL/PLATELET
Basophils Absolute: 0.1 10*3/uL (ref 0.0–0.2)
Basos: 1 %
EOS (ABSOLUTE): 0.1 10*3/uL (ref 0.0–0.4)
Eos: 2 %
Hematocrit: 42.3 % (ref 34.0–46.6)
Hemoglobin: 13.8 g/dL (ref 11.1–15.9)
Immature Grans (Abs): 0 10*3/uL (ref 0.0–0.1)
Immature Granulocytes: 0 %
Lymphocytes Absolute: 1.8 10*3/uL (ref 0.7–3.1)
Lymphs: 36 %
MCH: 29.1 pg (ref 26.6–33.0)
MCHC: 32.6 g/dL (ref 31.5–35.7)
MCV: 89 fL (ref 79–97)
Monocytes Absolute: 0.4 10*3/uL (ref 0.1–0.9)
Monocytes: 7 %
Neutrophils Absolute: 2.7 10*3/uL (ref 1.4–7.0)
Neutrophils: 54 %
Platelets: 265 10*3/uL (ref 150–450)
RBC: 4.75 x10E6/uL (ref 3.77–5.28)
RDW: 12.1 % (ref 11.7–15.4)
WBC: 5 10*3/uL (ref 3.4–10.8)

## 2023-01-28 LAB — COMPREHENSIVE METABOLIC PANEL
ALT: 15 IU/L (ref 0–32)
AST: 21 IU/L (ref 0–40)
Albumin/Globulin Ratio: 2.2 (ref 1.2–2.2)
Albumin: 4.2 g/dL (ref 3.9–4.9)
Alkaline Phosphatase: 87 IU/L (ref 44–121)
BUN/Creatinine Ratio: 23 (ref 12–28)
BUN: 18 mg/dL (ref 8–27)
Bilirubin Total: 0.2 mg/dL (ref 0.0–1.2)
CO2: 26 mmol/L (ref 20–29)
Calcium: 9.5 mg/dL (ref 8.7–10.3)
Chloride: 102 mmol/L (ref 96–106)
Creatinine, Ser: 0.79 mg/dL (ref 0.57–1.00)
Globulin, Total: 1.9 g/dL (ref 1.5–4.5)
Glucose: 105 mg/dL — ABNORMAL HIGH (ref 70–99)
Potassium: 4.9 mmol/L (ref 3.5–5.2)
Sodium: 144 mmol/L (ref 134–144)
Total Protein: 6.1 g/dL (ref 6.0–8.5)
eGFR: 83 mL/min/{1.73_m2} (ref >59)

## 2023-01-28 LAB — TSH: TSH: 0.307 u[IU]/mL — ABNORMAL LOW (ref 0.450–4.500)

## 2023-01-28 LAB — LIPID PANEL
Chol/HDL Ratio: 4.9 ratio — ABNORMAL HIGH (ref 0.0–4.4)
Cholesterol, Total: 298 mg/dL — ABNORMAL HIGH (ref 100–199)
HDL: 61 mg/dL (ref >39)
LDL Chol Calc (NIH): 215 mg/dL — ABNORMAL HIGH (ref 0–99)
Triglycerides: 125 mg/dL (ref 0–149)
VLDL Cholesterol Cal: 22 mg/dL (ref 5–40)

## 2023-01-28 LAB — MICROALBUMIN / CREATININE URINE RATIO
Creatinine, Urine: 130.8 mg/dL
Microalb/Creat Ratio: 12 mg/g creat (ref 0–29)
Microalbumin, Urine: 15.9 ug/mL

## 2023-01-28 LAB — HEMOGLOBIN A1C
Est. average glucose Bld gHb Est-mCnc: 148 mg/dL
Hgb A1c MFr Bld: 6.8 % — ABNORMAL HIGH (ref 4.8–5.6)

## 2023-01-28 LAB — CARDIOVASCULAR RISK ASSESSMENT

## 2023-01-28 MED ORDER — LEVOTHYROXINE SODIUM 50 MCG PO TABS
50.0000 ug | ORAL_TABLET | Freq: Every day | ORAL | 0 refills | Status: DC
Start: 2023-01-28 — End: 2023-05-30

## 2023-01-31 ENCOUNTER — Encounter: Payer: Self-pay | Admitting: Oncology

## 2023-01-31 ENCOUNTER — Other Ambulatory Visit: Payer: Self-pay

## 2023-01-31 DIAGNOSIS — E10649 Type 1 diabetes mellitus with hypoglycemia without coma: Secondary | ICD-10-CM | POA: Insufficient documentation

## 2023-01-31 DIAGNOSIS — J4520 Mild intermittent asthma, uncomplicated: Secondary | ICD-10-CM | POA: Insufficient documentation

## 2023-01-31 DIAGNOSIS — J4521 Mild intermittent asthma with (acute) exacerbation: Secondary | ICD-10-CM | POA: Insufficient documentation

## 2023-01-31 MED ORDER — ROSUVASTATIN CALCIUM 10 MG PO TABS: 10.0000 mg | ORAL_TABLET | Freq: Every day | ORAL | 2 refills | Status: DC

## 2023-02-04 ENCOUNTER — Encounter: Payer: Self-pay | Admitting: Family Medicine

## 2023-02-04 MED ORDER — PANTOPRAZOLE SODIUM 40 MG PO TBEC: 40.0000 mg | DELAYED_RELEASE_TABLET | Freq: Every day | ORAL | 3 refills | Status: DC

## 2023-02-04 MED ORDER — ALBUTEROL SULFATE HFA 108 (90 BASE) MCG/ACT IN AERS
INHALATION_SPRAY | RESPIRATORY_TRACT | 2 refills | Status: DC
Start: 2023-02-04 — End: 2024-02-19

## 2023-02-04 MED ORDER — LORAZEPAM 0.5 MG PO TABS: 0.5000 mg | ORAL_TABLET | Freq: Every day | ORAL | 1 refills | Status: DC | PRN

## 2023-02-04 MED ORDER — TRESIBA FLEXTOUCH 200 UNIT/ML ~~LOC~~ SOPN
24.0000 [IU] | PEN_INJECTOR | Freq: Every day | SUBCUTANEOUS | 0 refills | Status: DC
Start: 2023-02-04 — End: 2023-02-20

## 2023-02-04 MED ORDER — ROSUVASTATIN CALCIUM 10 MG PO TABS
10.0000 mg | ORAL_TABLET | Freq: Every day | ORAL | 0 refills | Status: DC
Start: 2023-02-04 — End: 2023-07-29

## 2023-02-04 MED ORDER — PULMICORT FLEXHALER 90 MCG/ACT IN AEPB: INHALATION_SPRAY | RESPIRATORY_TRACT | 2 refills | Status: DC

## 2023-02-04 MED ORDER — MONTELUKAST SODIUM 10 MG PO TABS: ORAL_TABLET | ORAL | 0 refills | Status: DC

## 2023-02-04 MED ORDER — RAMIPRIL 10 MG PO CAPS
10.0000 mg | ORAL_CAPSULE | Freq: Every day | ORAL | 1 refills | Status: DC
Start: 2023-02-04 — End: 2023-10-12

## 2023-02-04 NOTE — Assessment & Plan Note (Signed)
Continue albuterol hfa.

## 2023-02-20 ENCOUNTER — Other Ambulatory Visit: Payer: Self-pay

## 2023-02-20 DIAGNOSIS — E10649 Type 1 diabetes mellitus with hypoglycemia without coma: Secondary | ICD-10-CM

## 2023-02-20 MED ORDER — TRESIBA FLEXTOUCH 200 UNIT/ML ~~LOC~~ SOPN
28.0000 [IU] | PEN_INJECTOR | Freq: Every day | SUBCUTANEOUS | 0 refills | Status: DC
Start: 2023-02-20 — End: 2023-02-21

## 2023-02-21 ENCOUNTER — Other Ambulatory Visit: Payer: Self-pay | Admitting: Family Medicine

## 2023-02-21 DIAGNOSIS — E10649 Type 1 diabetes mellitus with hypoglycemia without coma: Secondary | ICD-10-CM

## 2023-04-17 ENCOUNTER — Other Ambulatory Visit: Payer: Self-pay | Admitting: Family Medicine

## 2023-04-17 DIAGNOSIS — F410 Panic disorder [episodic paroxysmal anxiety] without agoraphobia: Secondary | ICD-10-CM

## 2023-04-17 DIAGNOSIS — E10649 Type 1 diabetes mellitus with hypoglycemia without coma: Secondary | ICD-10-CM

## 2023-04-18 ENCOUNTER — Other Ambulatory Visit: Payer: Self-pay

## 2023-04-18 ENCOUNTER — Telehealth: Payer: Self-pay

## 2023-04-18 DIAGNOSIS — E10649 Type 1 diabetes mellitus with hypoglycemia without coma: Secondary | ICD-10-CM

## 2023-04-18 MED ORDER — DEXCOM G6 SENSOR MISC
3 refills | Status: DC
Start: 2023-04-18 — End: 2023-08-14

## 2023-04-18 NOTE — Telephone Encounter (Signed)
Prescription Request  04/18/2023    What is the name of the medication or equipment? Continuous Blood Gluc Sensor (DEXCOM G6 SENSOR) MISC-The patient is requesting a 90 day suppy.  LORazepam (ATIVAN) 0.5 MG tablet   Have you contacted your pharmacy to request a refill? No   Which pharmacy would you like this sent to?  [id number] - [place], [place] - [address] [place] [address] Phone: [phone] Fax: [phone]    Patient notified that their request is being sent to the clinical staff for review and that they should receive a response within 2 business days.   Please advise at [facility] [phone]

## 2023-05-15 NOTE — Progress Notes (Unsigned)
Subjective:  Patient ID: Nicole Marquez, female    DOB: 01-07-1961  Age: 62 y.o. MRN: [id number]  Chief Complaint  Patient presents with  . Annual Exam    HPI Well Adult Physical: Patient here for a comprehensive physical exam.The patient reports no problems Do you take any herbs or supplements that were not prescribed by a doctor? COENZYME Q10. Are you taking calcium supplements? no Are you taking aspirin daily? no  Encounter for general adult medical examination without abnormal findings  Physical ("At Risk" items are starred): Patient's last physical exam was 1 year ago .  Patient is not afflicted from Stress Incontinence and Urge Incontinence  Patient wears a seat belts Patient has smoke detectors and has carbon monoxide detectors. Patient practices appropriate gun safety. Patient wears sunscreen with extended sun exposure. Dental Care: biannual cleanings, brushes and flosses daily. Ophthalmology/Optometry: Annual visit.  Hearing loss: none Vision impairments: none  Menarche: 62 y.o Menstrual History: reg LMP: s/p menopause Pregnancy history: 3 Safe at home: yes Self breast exams: yes  Declined for [place] to schedule mammogram. Declined Tdap vaccine     05/17/2023   10:34 AM 01/27/2023   10:34 AM 10/25/2022    9:01 AM 04/22/2022    2:01 PM 11/25/2020    2:18 PM  Depression screen PHQ 2/9  Decreased Interest 0 0 0 1 0  Down, Depressed, Hopeless 0 0 0 0 0  PHQ - 2 Score 0 0 0 1 0  Altered sleeping 0 0  1   Tired, decreased energy 0 1  1   Change in appetite 0 0  0   Feeling bad or failure about yourself  0 0  0   Trouble concentrating 0 0  0   Moving slowly or fidgety/restless 0 0  0   Suicidal thoughts 0 0  0   PHQ-9 Score 0 1  3   Difficult doing work/chores Not difficult at all Not difficult at all  Not difficult at all        Social Hx   Social History   Socioeconomic History  . Marital status: Married    Spouse name: Chrissie Noa  . Number of children: 4  .  Years of education: 40  . Highest education level: 12th grade  Occupational History  . Not on file  Tobacco Use  . Smoking status: Former    Current packs/day: 0.00    Average packs/day: 1 pack/day for 30.0 years (30.0 ttl pk-yrs)    Types: Cigarettes    Start date: 16    Quit date: 2008    Years since quitting: 16.6  . Smokeless tobacco: Never  Vaping Use  . Vaping status: Never Used  Substance and Sexual Activity  . Alcohol use: Never  . Drug use: Never  . Sexual activity: Not Currently  Other Topics Concern  . Not on file  Social History Narrative   Patient has 1 set of twins and two individual children.   Social Determinants of Health   Financial Resource Strain: Low Risk  (10/25/2022)   Overall Financial Resource Strain (CARDIA)   . Difficulty of Paying Living Expenses: Not hard at all  Food Insecurity: No Food Insecurity (10/25/2022)   Hunger Vital Sign   . Worried About Programme researcher, broadcasting/film/video in the Last Year: Never true   . Ran Out of Food in the Last Year: Never true  Transportation Needs: No Transportation Needs (10/25/2022)   PRAPARE - Transportation   .  Lack of Transportation (Medical): No   . Lack of Transportation (Non-Medical): No  Physical Activity: Sufficiently Active (05/17/2023)   Exercise Vital Sign   . Days of Exercise per Week: 4 days   . Minutes of Exercise per Session: 60 min  Stress: No Stress Concern Present (10/25/2022)   [employer] of Occupational Health - Occupational Stress Questionnaire   . Feeling of Stress : Not at all  Social Connections: Moderately Integrated (10/25/2022)   Social Connection and Isolation Panel [NHANES]   . Frequency of Communication with Friends and Family: More than three times a week   . Frequency of Social Gatherings with Friends and Family: More than three times a week   . Attends Religious Services: More than 4 times per year   . Active Member of Clubs or Organizations: No   . Attends Banker  Meetings: Never   . Marital Status: Married   Past Medical History:  Diagnosis Date  . Anemia   . Asthma    seasonal  . Breast cancer (HCC) 2022   right  . DCIS (ductal carcinoma in situ)   . Exudative age-related macular degeneration, bilateral, stage unspecified (HCC)   . Family history of colon cancer 02/27/2021  . Family history of malignant neoplasm of digestive organs   . GERD (gastroesophageal reflux disease)   . History of hiatal hernia   . History of kidney stones   . Mixed hyperlipidemia   . Other specified hypothyroidism   . Type 1 diabetes mellitus with hypoglycemia without coma [facility])    Past Surgical History:  Procedure Laterality Date  . BREAST LUMPECTOMY WITH RADIOACTIVE SEED LOCALIZATION Right 05/28/2021   Procedure: RIGHT BREAST LUMPECTOMY WITH RADIOACTIVE SEED LOCALIZATION X2;  Surgeon: Almond Lint, MD;  Location: [facility] OR;  Service: General;  Laterality: Right;  . CESAREAN SECTION     x3  . COLONOSCOPY WITH PROPOFOL N/A 07/19/2022   Procedure: COLONOSCOPY WITH PROPOFOL;  Surgeon: Tressia Danas, MD;  Location: [facility];  Service: Gastroenterology;  Laterality: N/A;  . COLONOSCOPY WITH PROPOFOL N/A 09/07/2022   Procedure: COLONOSCOPY WITH PROPOFOL;  Surgeon: Tressia Danas, MD;  Location: [facility];  Service: Gastroenterology;  Laterality: N/A;  With APC treatment  . ENDOMETRIAL ABLATION    . HOT HEMOSTASIS N/A 09/07/2022   Procedure: HOT HEMOSTASIS (ARGON PLASMA COAGULATION/BICAP);  Surgeon: Tressia Danas, MD;  Location: [facility];  Service: Gastroenterology;  Laterality: N/A;  . Right rotator cuff repair      Family History  Problem Relation Age of Onset  . Cancer - Other Mother 61       carcinoid of colon  . Diabetes Father   . Colon cancer Brother 50  . Lung cancer Paternal Aunt        dx >50; smoking hx  . Cancer - Other Maternal Grandfather 62       carcinoid of colon  . Esophageal cancer Neg Hx     Review of Systems   Constitutional:  Negative for chills, fatigue and fever.  HENT:  Negative for congestion, ear pain, rhinorrhea and sore throat.   Respiratory:  Negative for cough and shortness of breath.   Cardiovascular:  Negative for chest pain.  Gastrointestinal:  Negative for abdominal pain, constipation, diarrhea, nausea and vomiting.  Genitourinary:  Negative for dysuria and urgency.  Musculoskeletal:  Negative for back pain and myalgias.  Neurological:  Negative for dizziness, weakness, light-headedness and headaches.  Psychiatric/Behavioral:  Negative for dysphoric mood. The patient is not  nervous/anxious.      Objective:  BP 128/62   Pulse 81   Temp (!) 96.8 F (36 C)   Ht 5\' 1"  (1.549 m)   Wt 136 lb (61.7 kg)   LMP 04/22/2014 (Exact Date)   SpO2 99%   BMI 25.70 kg/m      05/17/2023   10:25 AM 01/27/2023   10:31 AM 10/25/2022    8:50 AM  BP/Weight  Systolic BP 128 106 124  Diastolic BP 62 56 72  Wt. (Lbs) 136 137 132  BMI 25.7 kg/m2 25.89 kg/m2 24.94 kg/m2    Physical Exam Vitals reviewed. Exam conducted with a chaperone present.  Constitutional:      General: She is not in acute distress.    Appearance: Normal appearance. She is normal weight.  HENT:     Right Ear: There is impacted cerumen.     Left Ear: There is impacted cerumen.     Nose: Nose normal. No congestion or rhinorrhea.     Mouth/Throat:     Pharynx: No oropharyngeal exudate or posterior oropharyngeal erythema.  Eyes:     Conjunctiva/sclera: Conjunctivae normal.  Neck:     Thyroid: No thyroid mass.     Vascular: No carotid bruit.  Cardiovascular:     Rate and Rhythm: Normal rate and regular rhythm.     Pulses: Normal pulses.     Heart sounds: Normal heart sounds. No murmur heard. Pulmonary:     Effort: Pulmonary effort is normal.     Breath sounds: Normal breath sounds.  Abdominal:     General: Bowel sounds are normal.     Palpations: Abdomen is soft. There is no mass.     Tenderness: There is no  abdominal tenderness.  Genitourinary:    General: Normal vulva.     Vagina: Normal.     Cervix: Discharge (yellow) present. No friability, lesion or erythema.  Musculoskeletal:        General: Normal range of motion.     Cervical back: Normal range of motion.  Lymphadenopathy:     Cervical: No cervical adenopathy.  Skin:    General: Skin is warm and dry.     Findings: No lesion.  Neurological:     Mental Status: She is alert and oriented to person, place, and time.     Cranial Nerves: No cranial nerve deficit.  Psychiatric:        Mood and Affect: Mood normal.        Behavior: Behavior normal.   Diabetic Foot Exam - Simple   Simple Foot Form  05/17/2023  2:00 AM  Visual Inspection No deformities, no ulcerations, no other skin breakdown bilaterally: Yes Sensation Testing Intact to touch and monofilament testing bilaterally: Yes Pulse Check Posterior Tibialis and Dorsalis pulse intact bilaterally: Yes Comments      Lab Results  Component Value Date   WBC 6.8 05/17/2023   HGB 14.5 05/17/2023   HCT 45.5 05/17/2023   PLT 279 05/17/2023   GLUCOSE 52 (L) 05/17/2023   CHOL 189 05/17/2023   TRIG 94 05/17/2023   HDL 62 05/17/2023   LDLCALC 110 (H) 05/17/2023   ALT 18 05/17/2023   AST 27 05/17/2023   NA 143 05/17/2023   K 4.4 05/17/2023   CL 102 05/17/2023   CREATININE 0.77 05/17/2023   BUN 12 05/17/2023   CO2 26 05/17/2023   TSH 0.489 05/17/2023   INR 0.9 05/18/2022   HGBA1C 6.6 (H) 05/17/2023   MICROALBUR 30  11/25/2020      Assessment & Plan:  Routine medical exam -     CBC with Differential/Platelet -     Comprehensive metabolic panel -     Lipid panel  Screening for cervical cancer -     IGP, Aptima HPV, rfx 16/18,45  Visit for screening mammogram -     Digital Screening Mammogram, Left and Right; Future  Uncontrolled type 1 diabetes mellitus with hyperglycemia, with long-term current use of insulin (HCC) -     Hemoglobin A1c  Familial  hyperlipidemia -     Lipid panel  Vaginal discharge -     NuSwab VG, Candida 6sp  Abnormal thyroid stimulating hormone level -     T4, free -     TSH  Other orders -     T4, free -     TSH     Body mass index is 25.7 kg/m.   These are the goals we discussed:  Goals   None      This is a list of the screening recommended for you and due dates:  Health Maintenance  Topic Date Due  . COVID-19 Vaccine (1) Never done  . DTaP/Tdap/Td vaccine (1 - Tdap) Never done  . Eye exam for diabetics  12/16/2022  . Mammogram  03/08/2023  . Flu Shot  04/14/2023  . Hemoglobin A1C  11/14/2023  . Yearly kidney health urinalysis for diabetes  01/27/2024  . Complete foot exam   01/27/2024  . Yearly kidney function blood test for diabetes  05/16/2024  . Pap Smear  05/16/2026  . Colon Cancer Screening  09/08/2027  . Hepatitis C Screening  Completed  . HIV Screening  Completed  . Zoster (Shingles) Vaccine  Completed  . HPV Vaccine  Aged Out     Meds ordered this encounter  Medications  . DISCONTD: insulin aspart (NOVOLOG) 100 UNIT/ML injection    Sig: Inject 2-5 Units into the skin 3 (three) times daily with meals.    Dispense:  10 mL    Refill:  2    Follow-up: Return in about 3 months (around 08/16/2023) for chronic fasting.  An After Visit Summary was printed and given to the patient.  Blane Ohara, MD [facility] [phone]

## 2023-05-17 ENCOUNTER — Encounter: Payer: Self-pay | Admitting: Family Medicine

## 2023-05-17 ENCOUNTER — Telehealth: Payer: Self-pay

## 2023-05-17 ENCOUNTER — Ambulatory Visit (INDEPENDENT_AMBULATORY_CARE_PROVIDER_SITE_OTHER): Payer: BC Managed Care – PPO | Admitting: Family Medicine

## 2023-05-17 ENCOUNTER — Other Ambulatory Visit: Payer: Self-pay | Admitting: Family Medicine

## 2023-05-17 VITALS — BP 128/62 | HR 81 | Temp 96.8°F | Ht 61.0 in | Wt 136.0 lb

## 2023-05-17 DIAGNOSIS — E1065 Type 1 diabetes mellitus with hyperglycemia: Secondary | ICD-10-CM

## 2023-05-17 DIAGNOSIS — N898 Other specified noninflammatory disorders of vagina: Secondary | ICD-10-CM | POA: Diagnosis not present

## 2023-05-17 DIAGNOSIS — E7849 Other hyperlipidemia: Secondary | ICD-10-CM | POA: Diagnosis not present

## 2023-05-17 DIAGNOSIS — Z0000 Encounter for general adult medical examination without abnormal findings: Secondary | ICD-10-CM

## 2023-05-17 DIAGNOSIS — Z1231 Encounter for screening mammogram for malignant neoplasm of breast: Secondary | ICD-10-CM | POA: Diagnosis not present

## 2023-05-17 DIAGNOSIS — R7989 Other specified abnormal findings of blood chemistry: Secondary | ICD-10-CM | POA: Diagnosis not present

## 2023-05-17 DIAGNOSIS — Z124 Encounter for screening for malignant neoplasm of cervix: Secondary | ICD-10-CM

## 2023-05-17 MED ORDER — NOVOLOG FLEXPEN 100 UNIT/ML ~~LOC~~ SOPN: PEN_INJECTOR | SUBCUTANEOUS | 1 refills | Status: DC

## 2023-05-17 MED ORDER — INSULIN ASPART 100 UNIT/ML IJ SOLN
2.0000 [IU] | Freq: Three times a day (TID) | INTRAMUSCULAR | 2 refills | Status: DC
Start: 2023-05-17 — End: 2023-05-17

## 2023-05-17 NOTE — Telephone Encounter (Signed)
Nicole Marquez called today stating that the Novolog was sent in as a vial not as the flex pen. The pt needs the Novolog Flex pen u100. Please send in the correct prescription.

## 2023-05-18 LAB — LIPID PANEL
Chol/HDL Ratio: 3 ratio (ref 0.0–4.4)
Cholesterol, Total: 189 mg/dL (ref 100–199)
HDL: 62 mg/dL (ref >39)
LDL Chol Calc (NIH): 110 mg/dL — ABNORMAL HIGH (ref 0–99)
Triglycerides: 94 mg/dL (ref 0–149)
VLDL Cholesterol Cal: 17 mg/dL (ref 5–40)

## 2023-05-18 LAB — CBC WITH DIFFERENTIAL/PLATELET
Basophils Absolute: 0.1 10*3/uL (ref 0.0–0.2)
Basos: 1 %
EOS (ABSOLUTE): 0.1 10*3/uL (ref 0.0–0.4)
Eos: 2 %
Hematocrit: 45.5 % (ref 34.0–46.6)
Hemoglobin: 14.5 g/dL (ref 11.1–15.9)
Immature Grans (Abs): 0 10*3/uL (ref 0.0–0.1)
Immature Granulocytes: 0 %
Lymphocytes Absolute: 2.8 10*3/uL (ref 0.7–3.1)
Lymphs: 42 %
MCH: 28.4 pg (ref 26.6–33.0)
MCHC: 31.9 g/dL (ref 31.5–35.7)
MCV: 89 fL (ref 79–97)
Monocytes Absolute: 0.5 10*3/uL (ref 0.1–0.9)
Monocytes: 7 %
Neutrophils Absolute: 3.2 10*3/uL (ref 1.4–7.0)
Neutrophils: 48 %
Platelets: 279 10*3/uL (ref 150–450)
RBC: 5.11 x10E6/uL (ref 3.77–5.28)
RDW: 12.2 % (ref 11.7–15.4)
WBC: 6.8 10*3/uL (ref 3.4–10.8)

## 2023-05-18 LAB — COMPREHENSIVE METABOLIC PANEL
ALT: 18 IU/L (ref 0–32)
AST: 27 IU/L (ref 0–40)
Albumin: 4.4 g/dL (ref 3.9–4.9)
Alkaline Phosphatase: 91 IU/L (ref 44–121)
BUN/Creatinine Ratio: 16 (ref 12–28)
BUN: 12 mg/dL (ref 8–27)
Bilirubin Total: 0.2 mg/dL (ref 0.0–1.2)
CO2: 26 mmol/L (ref 20–29)
Calcium: 9.5 mg/dL (ref 8.7–10.3)
Chloride: 102 mmol/L (ref 96–106)
Creatinine, Ser: 0.77 mg/dL (ref 0.57–1.00)
Globulin, Total: 1.9 g/dL (ref 1.5–4.5)
Glucose: 52 mg/dL — ABNORMAL LOW (ref 70–99)
Potassium: 4.4 mmol/L (ref 3.5–5.2)
Sodium: 143 mmol/L (ref 134–144)
Total Protein: 6.3 g/dL (ref 6.0–8.5)
eGFR: 86 mL/min/{1.73_m2} (ref >59)

## 2023-05-18 LAB — HEMOGLOBIN A1C
Est. average glucose Bld gHb Est-mCnc: 143 mg/dL
Hgb A1c MFr Bld: 6.6 % — ABNORMAL HIGH (ref 4.8–5.6)

## 2023-05-19 ENCOUNTER — Other Ambulatory Visit: Payer: Self-pay

## 2023-05-19 ENCOUNTER — Telehealth: Payer: Self-pay

## 2023-05-19 DIAGNOSIS — E10649 Type 1 diabetes mellitus with hypoglycemia without coma: Secondary | ICD-10-CM

## 2023-05-19 LAB — IGP, APTIMA HPV, RFX 16/18,45
HPV Aptima: NEGATIVE
PAP Smear Comment: 0

## 2023-05-19 MED ORDER — NOVOLOG FLEXPEN 100 UNIT/ML ~~LOC~~ SOPN: PEN_INJECTOR | SUBCUTANEOUS | 1 refills | Status: AC

## 2023-05-19 MED ORDER — INSULIN DEGLUDEC FLEXTOUCH 200 UNIT/ML ~~LOC~~ SOPN: 28.0000 [IU] | PEN_INJECTOR | Freq: Every day | SUBCUTANEOUS | 1 refills | Status: DC

## 2023-05-19 MED ORDER — BD PEN NEEDLE NANO 2ND GEN 32G X 4 MM MISC
1 refills | Status: DC
Start: 2023-05-19 — End: 2023-12-19

## 2023-05-19 NOTE — Telephone Encounter (Signed)
Patient husband came in, stated he is still having a problem [facility] and getting patient [facility]olog and she needed refills on her Evaristo [place] and her needles.  Made patient husband aware, [facility] [place] and they stated they did get the rx for novolog (flexpen) but it had to have brand only on it due to insurance only covering brand only for both medication novolog and [place]. Order was sent in.

## 2023-05-20 DIAGNOSIS — Z124 Encounter for screening for malignant neoplasm of cervix: Secondary | ICD-10-CM | POA: Insufficient documentation

## 2023-05-20 DIAGNOSIS — Z1231 Encounter for screening mammogram for malignant neoplasm of breast: Secondary | ICD-10-CM | POA: Insufficient documentation

## 2023-05-20 DIAGNOSIS — R7989 Other specified abnormal findings of blood chemistry: Secondary | ICD-10-CM | POA: Insufficient documentation

## 2023-05-20 DIAGNOSIS — E7849 Other hyperlipidemia: Secondary | ICD-10-CM | POA: Insufficient documentation

## 2023-05-20 DIAGNOSIS — Z0000 Encounter for general adult medical examination without abnormal findings: Secondary | ICD-10-CM | POA: Insufficient documentation

## 2023-05-20 DIAGNOSIS — N898 Other specified noninflammatory disorders of vagina: Secondary | ICD-10-CM | POA: Insufficient documentation

## 2023-05-20 LAB — NUSWAB VG, CANDIDA 6SP
C PARAPSILOSIS/TROPICALIS: NEGATIVE
Candida albicans, NAA: NEGATIVE
Candida glabrata, NAA: NEGATIVE
Candida krusei, NAA: NEGATIVE
Candida lusitaniae, NAA: NEGATIVE
Trich vag by NAA: NEGATIVE

## 2023-05-21 NOTE — Assessment & Plan Note (Signed)

## 2023-05-21 NOTE — Assessment & Plan Note (Signed)
Order screening mammogram 

## 2023-05-21 NOTE — Assessment & Plan Note (Signed)
Pap smear was performed.

## 2023-05-21 NOTE — Assessment & Plan Note (Signed)
Check labs 

## 2023-05-21 NOTE — Assessment & Plan Note (Signed)
At goal. Continue Tresiba 24 units daily. Novolog sliding scale 2-5 units twice daily with largest meal.  Continue continuous glucose monitor.  Go to eye doctor annually. Check feet daily.  Labs drawn.

## 2023-05-21 NOTE — Assessment & Plan Note (Signed)
Order vaginal swab

## 2023-05-30 ENCOUNTER — Other Ambulatory Visit: Payer: Self-pay

## 2023-05-30 ENCOUNTER — Other Ambulatory Visit: Payer: Self-pay | Admitting: Family Medicine

## 2023-05-30 ENCOUNTER — Telehealth: Payer: Self-pay | Admitting: Family Medicine

## 2023-05-30 DIAGNOSIS — E10649 Type 1 diabetes mellitus with hypoglycemia without coma: Secondary | ICD-10-CM

## 2023-05-30 DIAGNOSIS — E038 Other specified hypothyroidism: Secondary | ICD-10-CM

## 2023-05-30 MED ORDER — DEXCOM G6 TRANSMITTER MISC
4 refills | Status: DC
Start: 2023-05-30 — End: 2023-06-01

## 2023-05-30 MED ORDER — LEVOTHYROXINE SODIUM 75 MCG PO TABS
75.0000 ug | ORAL_TABLET | Freq: Every day | ORAL | 0 refills | Status: DC
Start: 2023-05-30 — End: 2023-11-14

## 2023-05-30 NOTE — Telephone Encounter (Signed)
Patient's husband came in and reported that she been using Synthroid 75 mcg daily for approximately 8 weeks prior to her lab work on September 3.  I was under the impression she was doing 50 mcg.  He is requested and I send a new prescription for 75 mcg instead of the 50 mcg which I previously and sent.  He also had a concern about her insulin.  Apparently her insurance is requiring [place] to send the brand name for it to be covered.  This was done on September 5 by one of our nurses.  I told him if he had any further issues he needed to just let us know.  We also sent a new sensor for her CGM to the pharmacy.

## 2023-06-01 ENCOUNTER — Other Ambulatory Visit: Payer: Self-pay

## 2023-06-01 DIAGNOSIS — E10649 Type 1 diabetes mellitus with hypoglycemia without coma: Secondary | ICD-10-CM

## 2023-06-01 MED ORDER — DEXCOM G6 TRANSMITTER MISC: 4 refills | Status: DC

## 2023-06-17 ENCOUNTER — Other Ambulatory Visit: Payer: Self-pay | Admitting: Family Medicine

## 2023-06-17 DIAGNOSIS — E10649 Type 1 diabetes mellitus with hypoglycemia without coma: Secondary | ICD-10-CM

## 2023-06-18 ENCOUNTER — Other Ambulatory Visit: Payer: Self-pay | Admitting: Family Medicine

## 2023-06-18 DIAGNOSIS — E10649 Type 1 diabetes mellitus with hypoglycemia without coma: Secondary | ICD-10-CM

## 2023-06-20 ENCOUNTER — Telehealth: Payer: Self-pay

## 2023-06-20 NOTE — Telephone Encounter (Signed)
The patients husband called this afternoon stating that the pharmacy was confused on the dosage for this medication. On the medication it stated INJECT 24 UNITS INTO THE SKIN DAILY. ADMINISTER 28 UNITS UNDER THE SKIN DAILY. The patients husband stated that it should be ADMINISTER 28 UNITS UNDER THE SKIN DAILY. Please send in the correct rx.  The patients husband Chrissie Noa), would like for someone to call him back to let him know that this has been sent in. [phone]

## 2023-06-20 NOTE — Telephone Encounter (Signed)
Called patient and left voicemail stating that we needed to know the exact amount of units that she is injecting daily of the [place] so we can call [facility] [place] back for Clarification of units daily.

## 2023-06-21 LAB — HM DIABETES EYE EXAM

## 2023-07-16 ENCOUNTER — Other Ambulatory Visit: Payer: Self-pay | Admitting: Family Medicine

## 2023-07-16 DIAGNOSIS — F410 Panic disorder [episodic paroxysmal anxiety] without agoraphobia: Secondary | ICD-10-CM

## 2023-07-29 ENCOUNTER — Other Ambulatory Visit: Payer: Self-pay | Admitting: Family Medicine

## 2023-07-29 DIAGNOSIS — Z23 Encounter for immunization: Secondary | ICD-10-CM

## 2023-08-14 ENCOUNTER — Other Ambulatory Visit: Payer: Self-pay | Admitting: Family Medicine

## 2023-08-14 DIAGNOSIS — J4521 Mild intermittent asthma with (acute) exacerbation: Secondary | ICD-10-CM

## 2023-08-14 DIAGNOSIS — E10649 Type 1 diabetes mellitus with hypoglycemia without coma: Secondary | ICD-10-CM

## 2023-08-22 ENCOUNTER — Ambulatory Visit: Payer: BC Managed Care – PPO | Admitting: Family Medicine

## 2023-09-12 ENCOUNTER — Encounter: Payer: Self-pay | Admitting: Oncology

## 2023-09-15 ENCOUNTER — Other Ambulatory Visit: Payer: Self-pay | Admitting: Family Medicine

## 2023-09-15 ENCOUNTER — Encounter: Payer: Self-pay | Admitting: Oncology

## 2023-09-15 DIAGNOSIS — J4521 Mild intermittent asthma with (acute) exacerbation: Secondary | ICD-10-CM

## 2023-09-17 ENCOUNTER — Other Ambulatory Visit: Payer: Self-pay

## 2023-09-17 DIAGNOSIS — E10649 Type 1 diabetes mellitus with hypoglycemia without coma: Secondary | ICD-10-CM

## 2023-09-17 MED ORDER — DEXCOM G6 SENSOR MISC: 3 refills | Status: DC

## 2023-09-17 MED ORDER — TRESIBA FLEXTOUCH 200 UNIT/ML ~~LOC~~ SOPN: 28.0000 [IU] | PEN_INJECTOR | Freq: Every day | SUBCUTANEOUS | 2 refills | Status: DC

## 2023-09-17 MED ORDER — DEXCOM G6 TRANSMITTER MISC: 3 refills | Status: AC

## 2023-09-19 ENCOUNTER — Telehealth: Payer: Self-pay

## 2023-09-19 NOTE — Telephone Encounter (Signed)
 Copied from CRM [id number]. Topic: Clinical - Medication Refill >> Sep 15, 2023  3:43 PM Renea ORN wrote: Most Recent Primary Care Visit:  Provider: [facility]  Department: [facility]  Visit Type: PHYSICAL  Date: 05/17/2023  Medication: montelukast  (SINGULAIR ) 10 MG tablet  & Continuous Glucose Sensor (DEXCOM G6 SENSOR) MISC & Continuous Glucose Transmitter (DEXCOM G6 TRANSMITTER) MISC & insulin  degludec (TRESIBA  FLEXTOUCH) 200 UNIT/ML FlexTouch PATIENT HAS UPDATED INSURANCE and all will need Prior Authorizations  Has the patient contacted their pharmacy? Yes (Agent: If no, request that the patient contact the pharmacy for the refill. If patient does not wish to contact the pharmacy document the reason why and proceed with request.) (Agent: If yes, when and what did the pharmacy advise?)  Is this the correct pharmacy for this prescription? Yes If no, delete pharmacy and type the correct one.  This is the patient's preferred pharmacy:   [id number] [place] [place] - [address] [place] [address] Phone: [phone] Fax: [phone]   Has the prescription been filled recently? Yes  Is the patient out of the medication? Yes  Has the patient been seen for an appointment in the last year OR does the patient have an upcoming appointment? Yes  Can we respond through MyChart? No  Agent: Please be advised that Rx refills may take up to 3 business days. We ask that you follow-up with your pharmacy.

## 2023-09-19 NOTE — Telephone Encounter (Signed)
 Called patient, left message for patient to call office to schedule chronic fasting follow up with provider.

## 2023-09-27 ENCOUNTER — Telehealth: Payer: Self-pay

## 2023-09-27 ENCOUNTER — Other Ambulatory Visit: Payer: Self-pay | Admitting: Family Medicine

## 2023-09-27 ENCOUNTER — Encounter: Payer: Self-pay | Admitting: Family Medicine

## 2023-09-27 MED ORDER — TOUJEO MAX SOLOSTAR 300 UNIT/ML ~~LOC~~ SOPN: 28.0000 [IU] | PEN_INJECTOR | Freq: Every day | SUBCUTANEOUS | 0 refills | Status: DC

## 2023-09-27 NOTE — Telephone Encounter (Signed)
 Copied from CRM [id number]. Topic: Clinical - Prescription Issue >> Sep 27, 2023  4:06 PM Graeme ORN wrote: Reason for CRM: Patient husband called states received a letter from [facility] that they do not cover medication patient is currently prescribed. Patient recently changed insurance and new insurance sent letter they do not cover insulin  degludec (TRESIBA  FLEXTOUCH) 200 UNIT/ML FlexTouch Pen which was recently filled 09/19/2023. Listed preferred alternatives as [place] or Toujeo . Caller wants to know nexts steps suggested by provider. Thank you

## 2023-10-12 ENCOUNTER — Other Ambulatory Visit: Payer: Self-pay

## 2023-10-12 ENCOUNTER — Other Ambulatory Visit: Payer: Self-pay | Admitting: Family Medicine

## 2023-10-12 DIAGNOSIS — E10649 Type 1 diabetes mellitus with hypoglycemia without coma: Secondary | ICD-10-CM

## 2023-10-12 DIAGNOSIS — F410 Panic disorder [episodic paroxysmal anxiety] without agoraphobia: Secondary | ICD-10-CM

## 2023-11-13 ENCOUNTER — Other Ambulatory Visit: Payer: Self-pay | Admitting: Family Medicine

## 2023-11-13 DIAGNOSIS — E038 Other specified hypothyroidism: Secondary | ICD-10-CM

## 2023-11-21 ENCOUNTER — Other Ambulatory Visit: Payer: Self-pay | Admitting: Family Medicine

## 2023-11-22 ENCOUNTER — Telehealth: Payer: Self-pay

## 2023-11-22 NOTE — Telephone Encounter (Signed)
 Copied from CRM [id number]. Topic: Clinical - Medication Question >> Nov 22, 2023 11:53 AM Fuller Mandril wrote: Reason for CRM: Patient husband called. Was checking on refill request for insulin. Let him know it was sent to pharmacy. Caller stated she is prescribed 28 units but has been taking 30 to keep it more level. Has appt on 19th and can discuss it with provider at that time. Wanted to make clinician aware this was stated on call. Thank You

## 2023-11-22 NOTE — Telephone Encounter (Signed)
 Called patient left message for patient to call office back   Copied from CRM [id number]. Topic: General - Other >> Nov 21, 2023  6:00 PM Emylou G wrote: Reason for CRM: patient at the pharmacy - says he has been waiting 10 days for insulin glargine, 2 Unit Dial, (TOUJEO MAX SOLOSTAR) 300 UNIT/ML Solostar Pen.. I didn't see a req just for one today?  Please advise status.. [phone]

## 2023-11-30 ENCOUNTER — Ambulatory Visit: Payer: Medicare PPO | Admitting: Family Medicine

## 2023-11-30 VITALS — BP 122/64 | HR 80 | Temp 98.1°F | Ht 61.0 in | Wt 137.0 lb

## 2023-11-30 DIAGNOSIS — Z13820 Encounter for screening for osteoporosis: Secondary | ICD-10-CM | POA: Insufficient documentation

## 2023-11-30 DIAGNOSIS — I700 Atherosclerosis of aorta: Secondary | ICD-10-CM

## 2023-11-30 DIAGNOSIS — R635 Abnormal weight gain: Secondary | ICD-10-CM | POA: Diagnosis not present

## 2023-11-30 DIAGNOSIS — Z1231 Encounter for screening mammogram for malignant neoplasm of breast: Secondary | ICD-10-CM

## 2023-11-30 DIAGNOSIS — E1065 Type 1 diabetes mellitus with hyperglycemia: Secondary | ICD-10-CM | POA: Diagnosis not present

## 2023-11-30 DIAGNOSIS — E038 Other specified hypothyroidism: Secondary | ICD-10-CM

## 2023-11-30 DIAGNOSIS — E7849 Other hyperlipidemia: Secondary | ICD-10-CM

## 2023-11-30 DIAGNOSIS — Z780 Asymptomatic menopausal state: Secondary | ICD-10-CM

## 2023-11-30 NOTE — Progress Notes (Signed)
 Subjective:  Patient ID: Nicole Marquez, female    DOB: Jan 27, 1961  Age: 63 y.o. MRN: [id number]  Chief Complaint  Patient presents with   Weight Management Screening   Discussed the use of AI scribe software for clinical note transcription with the patient, who gave verbal consent to proceed.  History of Present Illness   Nicole Marquez is a 63 year old female with type 1 diabetes and an adrenal gland tumor who presents with symptoms she was concerned were suggestive of excess cortisol production.  She has had a benign adrenal gland tumor for 15 years and attributes several symptoms to excess cortisol production, including a lump on her neck, weight gain despite regular exercise, and blood sugar fluctuations. Her blood sugar sometimes does not respond to insulin, prompting her to increase her insulin dose from 28 to 30 units without consistent improvement. She has stopped exercising for the past two weeks due to these issues, resulting in a 3-pound weight loss and improved blood sugar control.  She has a history of type 1 diabetes for 54 years and is currently on Toujeo, taking 30 units daily, and uses Novolog before meals, typically 5 units at supper, adjusting as needed. She has not seen an endocrinologist recently, having last consulted one many years ago during pregnancy. She has observed that working out causes her blood sugar to rise, which is atypical, and has experienced variability in blood sugar control.  No chills, fever, fatigue, sore throat, chest pain, abdominal pain, breathing problems, urinary symptoms, excessive thirst or hunger, dizziness, headaches, and depression. She experiences back and muscle pain associated with working out and increased irritability, which is unusual for her. She has not been paying close attention to her weight but notes her clothes have become tighter.         11/30/2023    1:42 PM 05/17/2023   10:34 AM 01/27/2023   10:34 AM 10/25/2022    9:01 AM  04/22/2022    2:01 PM  Depression screen PHQ 2/9  Decreased Interest 0 0 0 0 1  Down, Depressed, Hopeless 0 0 0 0 0  PHQ - 2 Score 0 0 0 0 1  Altered sleeping 2 0 0  1  Tired, decreased energy 2 0 1  1  Change in appetite 0 0 0  0  Feeling bad or failure about yourself  0 0 0  0  Trouble concentrating 2 0 0  0  Moving slowly or fidgety/restless 0 0 0  0  Suicidal thoughts 0 0 0  0  PHQ-9 Score 6 0 1  3  Difficult doing work/chores Somewhat difficult Not difficult at all Not difficult at all  Not difficult at all        05/17/2023   10:34 AM  Fall Risk   Falls in the past year? 0  Number falls in past yr: 0  Injury with Fall? 0  Risk for fall due to : No Fall Risks  Follow up Falls evaluation completed    Patient Care Team: Blane Ohara, MD as PCP - General (Family Medicine) Pershing Proud, RN as Oncology Nurse Navigator Donnelly Angelica, RN as Oncology Nurse Navigator Almond Lint, MD as Consulting Physician (General Surgery) Serena Croissant, MD as Consulting Physician (Hematology and Oncology) Dorothy Puffer, MD as Consulting Physician (Radiation Oncology) Maeola Sarah, MD as Referring Physician (Ophthalmology)   Review of Systems  Constitutional:  Positive for fatigue. Negative for chills and fever.  HENT:  Negative for congestion, ear pain, rhinorrhea and sore throat.   Respiratory:  Negative for cough and shortness of breath.   Cardiovascular:  Negative for chest pain.  Gastrointestinal:  Negative for abdominal pain, constipation, diarrhea, nausea and vomiting.  Endocrine: Negative for polydipsia, polyphagia and polyuria.  Genitourinary:  Negative for dysuria and urgency.  Musculoskeletal:  Positive for back pain and myalgias. Negative for arthralgias.  Neurological:  Negative for dizziness, weakness, light-headedness and headaches.  Psychiatric/Behavioral:  Negative for dysphoric mood. The patient is not nervous/anxious.     Current Outpatient Medications on File  Prior to Visit  Medication Sig Dispense Refill   Aflibercept (EYLEA IZ) 1 Application by Intravitreal route every 6 (six) weeks. Eye injection     albuterol (VENTOLIN HFA) 108 (90 Base) MCG/ACT inhaler INHALE 1 TO 2 PUFFS EVERY 4 HOURS AS NEEDED FOR WHEEZING 18 each 2   Continuous Glucose Sensor (DEXCOM G6 SENSOR) MISC CHECK SUGARS BEFORE MEALS AND AT BEDTIME 3 each 3   Continuous Glucose Transmitter (DEXCOM G6 TRANSMITTER) MISC Change transmitter every 3 months DX CODE:  E10.649 1 each 3   insulin aspart (NOVOLOG FLEXPEN) 100 UNIT/ML FlexPen 3-5 U before each meal 15 mL 1   insulin glargine, 2 Unit Dial, (TOUJEO MAX SOLOSTAR) 300 UNIT/ML Solostar Pen INJECT 28 UNITS INTO THE SKIN AT BEDTIME. (Patient taking differently: Inject 30 Units into the skin at bedtime.) 2.8 mL 1   Insulin Pen Needle (BD PEN NEEDLE NANO 2ND GEN) 32G X 4 MM MISC 1 EACH BY DOES NOT APPLY ROUTE 4 (FOUR) TIMES DAILY. 200 each 1   levothyroxine (SYNTHROID) 75 MCG tablet TAKE 1 TABLET BY MOUTH EVERY DAY 90 tablet 0   LORazepam (ATIVAN) 0.5 MG tablet TAKE 1 TABLET (0.5 MG TOTAL) BY MOUTH DAILY AS NEEDED (PANIC ATTACKS). 30 tablet 2   montelukast (SINGULAIR) 10 MG tablet TAKE 1 TABLET BY MOUTH EVERYDAY AT BEDTIME 90 tablet 0   pantoprazole (PROTONIX) 40 MG tablet Take 1 tablet (40 mg total) by mouth daily. 90 tablet 3   PULMICORT FLEXHALER 90 MCG/ACT inhaler INHALE 1 PUFF INTO THE LUNGS TWICE A DAY 1 each 2   ramipril (ALTACE) 10 MG capsule TAKE 1 CAPSULE BY MOUTH EVERY DAY 90 capsule 1   rosuvastatin (CRESTOR) 10 MG tablet TAKE 1 TABLET BY MOUTH EVERY DAY 90 tablet 0   Current Facility-Administered Medications on File Prior to Visit  Medication Dose Route Frequency Provider Last Rate Last Admin   0.9 %  sodium chloride infusion  500 mL Intravenous Once Tressia Danas, MD       Past Medical History:  Diagnosis Date   Anemia    Asthma    seasonal   Breast cancer (HCC) 2022   right   DCIS (ductal carcinoma in situ)     Exudative age-related macular degeneration, bilateral, stage unspecified (HCC)    Family history of colon cancer 02/27/2021   Family history of malignant neoplasm of digestive organs    GERD (gastroesophageal reflux disease)    History of hiatal hernia    History of kidney stones    Mixed hyperlipidemia    Other specified hypothyroidism    Type 1 diabetes mellitus with hypoglycemia without coma (HCC)    Past Surgical History:  Procedure Laterality Date   BREAST LUMPECTOMY WITH RADIOACTIVE SEED LOCALIZATION Right 05/28/2021   Procedure: RIGHT BREAST LUMPECTOMY WITH RADIOACTIVE SEED LOCALIZATION X2;  Surgeon: Almond Lint, MD;  Location: [facility] OR;  Service: General;  Laterality: Right;  CESAREAN SECTION     x3   COLONOSCOPY WITH PROPOFOL N/A 07/19/2022   Procedure: COLONOSCOPY WITH PROPOFOL;  Surgeon: Tressia Danas, MD;  Location: [facility];  Service: Gastroenterology;  Laterality: N/A;   COLONOSCOPY WITH PROPOFOL N/A 09/07/2022   Procedure: COLONOSCOPY WITH PROPOFOL;  Surgeon: Tressia Danas, MD;  Location: [facility];  Service: Gastroenterology;  Laterality: N/A;  With APC treatment   ENDOMETRIAL ABLATION     HOT HEMOSTASIS N/A 09/07/2022   Procedure: HOT HEMOSTASIS (ARGON PLASMA COAGULATION/BICAP);  Surgeon: Tressia Danas, MD;  Location: [facility];  Service: Gastroenterology;  Laterality: N/A;   Right rotator cuff repair      Family History  Problem Relation Age of Onset   Cancer - Other Mother 53       carcinoid of colon   Diabetes Father    Colon cancer Brother 63   Lung cancer Paternal Aunt        dx >50; smoking hx   Cancer - Other Maternal Grandfather 13       carcinoid of colon   Esophageal cancer Neg Hx    Social History   Socioeconomic History   Marital status: Married    Spouse name: Chrissie Noa   Number of children: 4   Years of education: 12   Highest education level: 12th grade  Occupational History   Not on file  Tobacco Use   Smoking status:  Former    Current packs/day: 0.00    Average packs/day: 1 pack/day for 30.0 years (30.0 ttl pk-yrs)    Types: Cigarettes    Start date: 47    Quit date: 2008    Years since quitting: 17.2   Smokeless tobacco: Never  Vaping Use   Vaping status: Never Used  Substance and Sexual Activity   Alcohol use: Never   Drug use: Never   Sexual activity: Not Currently  Other Topics Concern   Not on file  Social History Narrative   Patient has 1 set of twins and two individual children.   Social Drivers of Corporate investment banker Strain: Low Risk  (10/25/2022)   Overall Financial Resource Strain (CARDIA)    Difficulty of Paying Living Expenses: Not hard at all  Food Insecurity: No Food Insecurity (10/25/2022)   Hunger Vital Sign    Worried About Running Out of Food in the Last Year: Never true    Ran Out of Food in the Last Year: Never true  Transportation Needs: No Transportation Needs (10/25/2022)   PRAPARE - Administrator, Civil Service (Medical): No    Lack of Transportation (Non-Medical): No  Physical Activity: Sufficiently Active (05/17/2023)   Exercise Vital Sign    Days of Exercise per Week: 4 days    Minutes of Exercise per Session: 60 min  Stress: No Stress Concern Present (10/25/2022)   [employer] of Occupational Health - Occupational Stress Questionnaire    Feeling of Stress : Not at all  Social Connections: Moderately Integrated (10/25/2022)   Social Connection and Isolation Panel [NHANES]    Frequency of Communication with Friends and Family: More than three times a week    Frequency of Social Gatherings with Friends and Family: More than three times a week    Attends Religious Services: More than 4 times per year    Active Member of [employer] or Organizations: No    Attends Banker Meetings: Never    Marital Status: Married    Objective:  BP 122/64  Pulse 80   Temp 98.1 F (36.7 C)   Ht 5\' 1"  (1.549 m)   Wt 137 lb (62.1 kg)   LMP  04/22/2014 (Exact Date)   SpO2 97%   BMI 25.89 kg/m      11/30/2023    1:33 PM 05/17/2023   10:25 AM 01/27/2023   10:31 AM  BP/Weight  Systolic BP 122 128 106  Diastolic BP 64 62 56  Wt. (Lbs) 137 136 137  BMI 25.89 kg/m2 25.7 kg/m2 25.89 kg/m2    Physical Exam Vitals reviewed.  Constitutional:      Appearance: Normal appearance. She is normal weight.  Neck:     Vascular: No carotid bruit.     Comments: Bulge on posterior neck Cardiovascular:     Rate and Rhythm: Normal rate and regular rhythm.     Heart sounds: Normal heart sounds.  Pulmonary:     Effort: Pulmonary effort is normal. No respiratory distress.     Breath sounds: Normal breath sounds.  Abdominal:     General: Abdomen is flat. Bowel sounds are normal.     Palpations: Abdomen is soft.     Tenderness: There is no abdominal tenderness.  Neurological:     Mental Status: She is alert and oriented to person, place, and time.  Psychiatric:        Mood and Affect: Mood normal.        Behavior: Behavior normal.     Diabetic Foot Exam - Simple   No data filed      Lab Results  Component Value Date   WBC 6.8 05/17/2023   HGB 14.5 05/17/2023   HCT 45.5 05/17/2023   PLT 279 05/17/2023   GLUCOSE 52 (L) 05/17/2023   CHOL 189 05/17/2023   TRIG 94 05/17/2023   HDL 62 05/17/2023   LDLCALC 110 (H) 05/17/2023   ALT 18 05/17/2023   AST 27 05/17/2023   NA 143 05/17/2023   K 4.4 05/17/2023   CL 102 05/17/2023   CREATININE 0.77 05/17/2023   BUN 12 05/17/2023   CO2 26 05/17/2023   TSH 0.307 (L) 01/27/2023   INR 0.9 05/18/2022   HGBA1C 6.6 (H) 05/17/2023   MICROALBUR 30 11/25/2020      Assessment & Plan:   Recent weight gain Assessment & Plan: Recommend continue to work on eating healthy diet and exercise.    Aortic atherosclerosis (HCC) Assessment & Plan: Continue on crestor 10 mg daily. Recommend start aspirin 81 mg daily.    Familial hyperlipidemia Assessment & Plan: The current medical  regimen is effective;  continue present plan and medications.  Crestor 10 mg daily.   Uncontrolled type 1 diabetes mellitus with hyperglycemia, with long-term current use of insulin (HCC) Assessment & Plan: Long-standing type 1 diabetes mellitus for 54 years with erratic blood glucose levels, particularly during exercise. Increased insulin dosage from 28 to 30 units was ineffective. Blood glucose control improved after ceasing exercise for two weeks, suggesting exercise-induced stress. Current insulin regimen includes Toujeo 30 units daily and variable doses of Novolog before meals. No recent endocrinology follow-up, last seen many years ago during pregnancy. - Refer to endocrinologist for further evaluation and management of diabetes and potential insulin resistance. - Download and review Dexcom data for blood glucose trends. - Order morning cortisol level to evaluate for Cushing's syndrome.   Other specified hypothyroidism Assessment & Plan: Previously well controlled Continue Synthroid at current dose      Encounter for screening mammogram for malignant  neoplasm of breast -     3D Screening Mammogram, Left and Right; Future  Encounter for osteoporosis screening in asymptomatic postmenopausal patient -     DG Bone Density; Future     No orders of the defined types were placed in this encounter.   Orders Placed This Encounter  Procedures   DG Bone Density   MM 3D SCREENING MAMMOGRAM BILATERAL BREAST     Follow-up: Return in about 3 months (around 03/01/2024).   I,Katherina A Bramblett,acting as a scribe for Blane Ohara, MD.,have documented all relevant documentation on the behalf of Blane Ohara, MD,as directed by  Blane Ohara, MD while in the presence of Blane Ohara, MD.   Clayborn Bigness I Leal-Borjas,acting as a scribe for Blane Ohara, MD.,have documented all relevant documentation on the behalf of Blane Ohara, MD,as directed by  Blane Ohara, MD while in the presence of Blane Ohara, MD.    An After Visit Summary was printed and given to the patient.  I attest that I have reviewed this visit and agree with the plan scribed by my staff.   Blane Ohara, MD [facility] [phone]

## 2023-12-01 ENCOUNTER — Other Ambulatory Visit

## 2023-12-01 ENCOUNTER — Other Ambulatory Visit: Payer: Self-pay | Admitting: Family Medicine

## 2023-12-01 DIAGNOSIS — E10649 Type 1 diabetes mellitus with hypoglycemia without coma: Secondary | ICD-10-CM

## 2023-12-01 DIAGNOSIS — R7989 Other specified abnormal findings of blood chemistry: Secondary | ICD-10-CM

## 2023-12-01 DIAGNOSIS — E782 Mixed hyperlipidemia: Secondary | ICD-10-CM

## 2023-12-04 ENCOUNTER — Encounter: Payer: Self-pay | Admitting: Family Medicine

## 2023-12-04 NOTE — Assessment & Plan Note (Signed)
 Previously well controlled Continue Synthroid at current dose

## 2023-12-04 NOTE — Assessment & Plan Note (Signed)
 Recommend continue to work on eating healthy diet and exercise.

## 2023-12-04 NOTE — Assessment & Plan Note (Addendum)
 Long-standing type 1 diabetes mellitus for 54 years with erratic blood glucose levels, particularly during exercise. Increased insulin dosage from 28 to 30 units was ineffective. Blood glucose control improved after ceasing exercise for two weeks, suggesting exercise-induced stress. Current insulin regimen includes Toujeo 30 units daily and variable doses of Novolog before meals. No recent endocrinology follow-up, last seen many years ago during pregnancy. - Refer to endocrinologist for further evaluation and management of diabetes and potential insulin resistance. - Download and review Dexcom data for blood glucose trends. - Order morning cortisol level to evaluate for Cushing's syndrome.

## 2023-12-04 NOTE — Assessment & Plan Note (Signed)
 Continue on crestor 10 mg daily. Recommend start aspirin 81 mg daily.

## 2023-12-04 NOTE — Assessment & Plan Note (Signed)
 The current medical regimen is effective;  continue present plan and medications.  Crestor 10 mg daily.

## 2023-12-08 ENCOUNTER — Other Ambulatory Visit: Payer: Self-pay | Admitting: Family Medicine

## 2023-12-08 DIAGNOSIS — Z09 Encounter for follow-up examination after completed treatment for conditions other than malignant neoplasm: Secondary | ICD-10-CM

## 2023-12-12 ENCOUNTER — Encounter: Payer: Self-pay | Admitting: Family Medicine

## 2023-12-16 ENCOUNTER — Other Ambulatory Visit: Payer: Self-pay | Admitting: Family Medicine

## 2023-12-16 ENCOUNTER — Other Ambulatory Visit: Payer: Self-pay

## 2023-12-16 DIAGNOSIS — J4521 Mild intermittent asthma with (acute) exacerbation: Secondary | ICD-10-CM

## 2023-12-16 DIAGNOSIS — Z23 Encounter for immunization: Secondary | ICD-10-CM

## 2023-12-16 LAB — CBC WITH DIFFERENTIAL/PLATELET
Basophils Absolute: 0 10*3/uL (ref 0.0–0.2)
Basos: 1 %
EOS (ABSOLUTE): 0.1 10*3/uL (ref 0.0–0.4)
Eos: 2 %
Hematocrit: 42.7 % (ref 34.0–46.6)
Hemoglobin: 14 g/dL (ref 11.1–15.9)
Immature Grans (Abs): 0 10*3/uL (ref 0.0–0.1)
Immature Granulocytes: 1 %
Lymphocytes Absolute: 1.1 10*3/uL (ref 0.7–3.1)
Lymphs: 20 %
MCH: 28.5 pg (ref 26.6–33.0)
MCHC: 32.8 g/dL (ref 31.5–35.7)
MCV: 87 fL (ref 79–97)
Monocytes Absolute: 0.2 10*3/uL (ref 0.1–0.9)
Monocytes: 3 %
Neutrophils Absolute: 4.2 10*3/uL (ref 1.4–7.0)
Neutrophils: 73 %
Platelets: 234 10*3/uL (ref 150–450)
RBC: 4.91 x10E6/uL (ref 3.77–5.28)
RDW: 12.7 % (ref 11.7–15.4)
WBC: 5.7 10*3/uL (ref 3.4–10.8)

## 2023-12-16 LAB — COMPREHENSIVE METABOLIC PANEL WITH GFR
ALT: 15 IU/L (ref 0–32)
AST: 24 IU/L (ref 0–40)
Albumin: 4.7 g/dL (ref 3.9–4.9)
Alkaline Phosphatase: 94 IU/L (ref 44–121)
BUN/Creatinine Ratio: 18 (ref 12–28)
BUN: 13 mg/dL (ref 8–27)
Bilirubin Total: 0.3 mg/dL (ref 0.0–1.2)
CO2: 19 mmol/L — ABNORMAL LOW (ref 20–29)
Calcium: 10.1 mg/dL (ref 8.7–10.3)
Chloride: 100 mmol/L (ref 96–106)
Creatinine, Ser: 0.72 mg/dL (ref 0.57–1.00)
Globulin, Total: 2 g/dL (ref 1.5–4.5)
Glucose: 211 mg/dL — ABNORMAL HIGH (ref 70–99)
Potassium: 4.6 mmol/L (ref 3.5–5.2)
Sodium: 140 mmol/L (ref 134–144)
Total Protein: 6.7 g/dL (ref 6.0–8.5)
eGFR: 93 mL/min/{1.73_m2} (ref >59)

## 2023-12-16 LAB — LIPID PANEL W/O CHOL/HDL RATIO
Cholesterol, Total: 239 mg/dL — ABNORMAL HIGH (ref 100–199)
HDL: 67 mg/dL (ref >39)
LDL Chol Calc (NIH): 157 mg/dL — ABNORMAL HIGH (ref 0–99)
Triglycerides: 85 mg/dL (ref 0–149)
VLDL Cholesterol Cal: 15 mg/dL (ref 5–40)

## 2023-12-16 LAB — T4, FREE: Free T4: 2.08 ng/dL — ABNORMAL HIGH (ref 0.82–1.77)

## 2023-12-16 LAB — HGB A1C W/O EAG: Hgb A1c MFr Bld: 6.8 % — ABNORMAL HIGH (ref 4.8–5.6)

## 2023-12-16 LAB — DEXAMETHASONE, BLOOD: Dexamethasone, Blood: 397 ng/dL

## 2023-12-16 LAB — CORTISOL DEXAMETHASONE REFLEX: Cortisol, Serum LCMS: 7.4 ug/dL — ABNORMAL HIGH

## 2023-12-16 LAB — TSH: TSH: 0.203 u[IU]/mL — ABNORMAL LOW (ref 0.450–4.500)

## 2023-12-16 LAB — ACTH: ACTH: 2.1 pg/mL — ABNORMAL LOW (ref 7.2–63.3)

## 2023-12-16 LAB — DHEA-SULFATE: DHEA-SO4: 36.9 ug/dL (ref 20.4–186.6)

## 2023-12-19 ENCOUNTER — Other Ambulatory Visit: Payer: Self-pay | Admitting: Family Medicine

## 2023-12-19 DIAGNOSIS — E10649 Type 1 diabetes mellitus with hypoglycemia without coma: Secondary | ICD-10-CM

## 2023-12-20 ENCOUNTER — Encounter: Payer: Self-pay | Admitting: Family Medicine

## 2023-12-21 ENCOUNTER — Other Ambulatory Visit: Payer: Self-pay

## 2023-12-21 DIAGNOSIS — R7989 Other specified abnormal findings of blood chemistry: Secondary | ICD-10-CM

## 2023-12-21 NOTE — Telephone Encounter (Unsigned)
 Copied from CRM [id number]. Topic: Clinical - Lab/Test Results >> Dec 21, 2023  9:14 AM Quay Burow wrote: Reason for CRM: The patients spouse is calling in regards to the lab results and wants Dr. Sedalia Muta to know he would like the referral for his wife to see the Dr. Bradd Burner the endocrinologist her contact number is [phone].

## 2023-12-21 NOTE — Telephone Encounter (Signed)
 Called patient husband back and he stated he wants the referral for endocrinologist sent to [facility] endocrinologist instead.   Made Sande Rives aware of change, and stated referral will be sent to [facility] as requested

## 2023-12-21 NOTE — Telephone Encounter (Signed)
 Pts spouse states the referral for endocrinology was sent to [place] instead of [place]. He states he's never been down to [place] before and wants to referral to be sent to [facility]. He is also requesting to speak with [place]

## 2023-12-22 ENCOUNTER — Other Ambulatory Visit: Payer: Self-pay | Admitting: Family Medicine

## 2023-12-22 DIAGNOSIS — E249 Cushing's syndrome, unspecified: Secondary | ICD-10-CM

## 2023-12-26 ENCOUNTER — Other Ambulatory Visit: Payer: Self-pay

## 2023-12-26 DIAGNOSIS — Z23 Encounter for immunization: Secondary | ICD-10-CM

## 2023-12-26 DIAGNOSIS — E038 Other specified hypothyroidism: Secondary | ICD-10-CM

## 2023-12-26 MED ORDER — LEVOTHYROXINE SODIUM 50 MCG PO TABS
50.0000 ug | ORAL_TABLET | Freq: Every day | ORAL | 1 refills | Status: AC
Start: 2023-12-26 — End: ?

## 2023-12-26 MED ORDER — ROSUVASTATIN CALCIUM 10 MG PO TABS: 20.0000 mg | ORAL_TABLET | Freq: Every day | ORAL | 0 refills | Status: DC

## 2024-01-03 ENCOUNTER — Telehealth: Payer: Self-pay | Admitting: Family Medicine

## 2024-01-03 ENCOUNTER — Encounter: Payer: Self-pay | Admitting: Family Medicine

## 2024-01-03 NOTE — Telephone Encounter (Signed)
 Left message to schedule an Welcome to Medicare visit and to see if patient was able to come in to pick up her 24 urine lab testing Dr. Reinhold Carbine ordered.

## 2024-01-04 ENCOUNTER — Other Ambulatory Visit: Payer: Self-pay | Admitting: Family Medicine

## 2024-01-04 DIAGNOSIS — Z9889 Other specified postprocedural states: Secondary | ICD-10-CM

## 2024-01-05 ENCOUNTER — Other Ambulatory Visit: Payer: Self-pay

## 2024-01-05 ENCOUNTER — Other Ambulatory Visit: Payer: Self-pay | Admitting: Family Medicine

## 2024-01-05 DIAGNOSIS — Z23 Encounter for immunization: Secondary | ICD-10-CM

## 2024-01-05 DIAGNOSIS — E249 Cushing's syndrome, unspecified: Secondary | ICD-10-CM

## 2024-01-10 ENCOUNTER — Other Ambulatory Visit: Payer: Self-pay | Admitting: Family Medicine

## 2024-01-10 ENCOUNTER — Ambulatory Visit
Admission: RE | Admit: 2024-01-10 | Discharge: 2024-01-10 | Disposition: A | Discharge status: Home / Self Care | Source: Ambulatory Visit | Attending: Family Medicine | Admitting: Family Medicine

## 2024-01-10 DIAGNOSIS — N6310 Unspecified lump in the right breast, unspecified quadrant: Secondary | ICD-10-CM

## 2024-01-10 DIAGNOSIS — Z9889 Other specified postprocedural states: Secondary | ICD-10-CM

## 2024-01-10 DIAGNOSIS — N6489 Other specified disorders of breast: Secondary | ICD-10-CM

## 2024-01-11 ENCOUNTER — Ambulatory Visit
Admission: RE | Admit: 2024-01-11 | Discharge: 2024-01-11 | Disposition: A | Discharge status: Home / Self Care | Source: Ambulatory Visit | Attending: Family Medicine | Admitting: Family Medicine

## 2024-01-11 ENCOUNTER — Ambulatory Visit
Admission: RE | Admit: 2024-01-11 | Discharge: 2024-01-11 | Discharge status: Home / Self Care | Source: Ambulatory Visit | Attending: Family Medicine | Admitting: Family Medicine

## 2024-01-11 DIAGNOSIS — N6489 Other specified disorders of breast: Secondary | ICD-10-CM

## 2024-01-11 HISTORY — PX: BREAST BIOPSY: SHX20

## 2024-01-12 ENCOUNTER — Encounter: Payer: Self-pay | Admitting: Family Medicine

## 2024-01-12 LAB — SURGICAL PATHOLOGY

## 2024-01-12 LAB — CORTISOL, URINE, FREE
Cortisol (Ur), Free: 6 ug/(24.h) (ref 6–42)
Cortisol,F,ug/L,U: 4 ug/L

## 2024-01-22 ENCOUNTER — Other Ambulatory Visit: Payer: Self-pay | Admitting: Family Medicine

## 2024-01-22 DIAGNOSIS — F410 Panic disorder [episodic paroxysmal anxiety] without agoraphobia: Secondary | ICD-10-CM

## 2024-01-31 ENCOUNTER — Other Ambulatory Visit

## 2024-01-31 DIAGNOSIS — E038 Other specified hypothyroidism: Secondary | ICD-10-CM

## 2024-02-01 LAB — TSH: TSH: 8.1 u[IU]/mL — ABNORMAL HIGH (ref 0.450–4.500)

## 2024-02-02 ENCOUNTER — Ambulatory Visit: Payer: Self-pay | Admitting: Family Medicine

## 2024-02-03 ENCOUNTER — Other Ambulatory Visit: Payer: Self-pay

## 2024-02-03 DIAGNOSIS — E038 Other specified hypothyroidism: Secondary | ICD-10-CM

## 2024-02-03 MED ORDER — LEVOTHYROXINE SODIUM 75 MCG PO TABS: 75.0000 ug | ORAL_TABLET | Freq: Every day | ORAL | 0 refills | Status: DC

## 2024-02-09 ENCOUNTER — Other Ambulatory Visit: Payer: Self-pay | Admitting: Family Medicine

## 2024-02-09 DIAGNOSIS — E038 Other specified hypothyroidism: Secondary | ICD-10-CM

## 2024-02-19 ENCOUNTER — Other Ambulatory Visit: Payer: Self-pay | Admitting: Family Medicine

## 2024-02-19 DIAGNOSIS — J4521 Mild intermittent asthma with (acute) exacerbation: Secondary | ICD-10-CM

## 2024-02-21 ENCOUNTER — Other Ambulatory Visit: Payer: Self-pay | Admitting: Family Medicine

## 2024-03-06 DIAGNOSIS — H353231 Exudative age-related macular degeneration, bilateral, with active choroidal neovascularization: Secondary | ICD-10-CM | POA: Diagnosis not present

## 2024-03-13 ENCOUNTER — Other Ambulatory Visit: Payer: Self-pay

## 2024-03-13 ENCOUNTER — Other Ambulatory Visit: Payer: Self-pay | Admitting: Family Medicine

## 2024-03-13 DIAGNOSIS — J4521 Mild intermittent asthma with (acute) exacerbation: Secondary | ICD-10-CM

## 2024-03-13 DIAGNOSIS — Z800 Family history of malignant neoplasm of digestive organs: Secondary | ICD-10-CM

## 2024-03-15 ENCOUNTER — Other Ambulatory Visit

## 2024-03-15 DIAGNOSIS — E038 Other specified hypothyroidism: Secondary | ICD-10-CM | POA: Diagnosis not present

## 2024-03-15 DIAGNOSIS — Z13820 Encounter for screening for osteoporosis: Secondary | ICD-10-CM

## 2024-03-16 LAB — TSH: TSH: 1.74 u[IU]/mL (ref 0.450–4.500)

## 2024-03-18 ENCOUNTER — Ambulatory Visit: Payer: Self-pay | Admitting: Family Medicine

## 2024-03-21 ENCOUNTER — Other Ambulatory Visit: Payer: Self-pay | Admitting: Physician Assistant

## 2024-03-21 DIAGNOSIS — E1065 Type 1 diabetes mellitus with hyperglycemia: Secondary | ICD-10-CM

## 2024-03-21 MED ORDER — TOUJEO MAX SOLOSTAR 300 UNIT/ML ~~LOC~~ SOPN
30.0000 [IU] | PEN_INJECTOR | Freq: Every day | SUBCUTANEOUS | 1 refills | Status: DC
Start: 2024-03-21 — End: 2024-06-18

## 2024-04-14 ENCOUNTER — Other Ambulatory Visit: Payer: Self-pay | Admitting: Family Medicine

## 2024-04-14 DIAGNOSIS — E10649 Type 1 diabetes mellitus with hypoglycemia without coma: Secondary | ICD-10-CM

## 2024-04-19 ENCOUNTER — Other Ambulatory Visit: Payer: Self-pay | Admitting: Family Medicine

## 2024-04-19 DIAGNOSIS — F410 Panic disorder [episodic paroxysmal anxiety] without agoraphobia: Secondary | ICD-10-CM

## 2024-04-19 NOTE — Telephone Encounter (Signed)
 Copied from CRM [id number]. Topic: Clinical - Prescription Issue >> Apr 19, 2024 12:51 PM Delon T wrote: Reason for CRM: LORazepam  (ATIVAN ) 0.5 MG tablet - calling in status of refillGLENWOOD Marquez [phone]

## 2024-04-30 DIAGNOSIS — H353231 Exudative age-related macular degeneration, bilateral, with active choroidal neovascularization: Secondary | ICD-10-CM | POA: Diagnosis not present

## 2024-04-30 DIAGNOSIS — H43813 Vitreous degeneration, bilateral: Secondary | ICD-10-CM | POA: Diagnosis not present

## 2024-04-30 DIAGNOSIS — H2513 Age-related nuclear cataract, bilateral: Secondary | ICD-10-CM | POA: Diagnosis not present

## 2024-04-30 DIAGNOSIS — E113213 Type 2 diabetes mellitus with mild nonproliferative diabetic retinopathy with macular edema, bilateral: Secondary | ICD-10-CM | POA: Diagnosis not present

## 2024-04-30 DIAGNOSIS — H40022 Open angle with borderline findings, high risk, left eye: Secondary | ICD-10-CM | POA: Diagnosis not present

## 2024-05-05 ENCOUNTER — Other Ambulatory Visit: Payer: Self-pay | Admitting: Family Medicine

## 2024-06-15 ENCOUNTER — Other Ambulatory Visit: Payer: Self-pay | Admitting: Family Medicine

## 2024-06-15 DIAGNOSIS — E1065 Type 1 diabetes mellitus with hyperglycemia: Secondary | ICD-10-CM

## 2024-06-16 ENCOUNTER — Other Ambulatory Visit: Payer: Self-pay | Admitting: Family Medicine

## 2024-06-16 DIAGNOSIS — J4521 Mild intermittent asthma with (acute) exacerbation: Secondary | ICD-10-CM

## 2024-06-16 DIAGNOSIS — Z23 Encounter for immunization: Secondary | ICD-10-CM

## 2024-06-18 DIAGNOSIS — H353231 Exudative age-related macular degeneration, bilateral, with active choroidal neovascularization: Secondary | ICD-10-CM | POA: Diagnosis not present

## 2024-07-14 ENCOUNTER — Other Ambulatory Visit: Payer: Self-pay | Admitting: Family Medicine

## 2024-07-14 DIAGNOSIS — F410 Panic disorder [episodic paroxysmal anxiety] without agoraphobia: Secondary | ICD-10-CM

## 2024-08-06 DIAGNOSIS — H353231 Exudative age-related macular degeneration, bilateral, with active choroidal neovascularization: Secondary | ICD-10-CM | POA: Diagnosis not present

## 2024-08-17 ENCOUNTER — Other Ambulatory Visit: Payer: Self-pay | Admitting: Family Medicine

## 2024-08-17 DIAGNOSIS — E10649 Type 1 diabetes mellitus with hypoglycemia without coma: Secondary | ICD-10-CM

## 2024-08-17 DIAGNOSIS — F410 Panic disorder [episodic paroxysmal anxiety] without agoraphobia: Secondary | ICD-10-CM

## 2024-08-20 NOTE — Telephone Encounter (Unsigned)
 Copied from CRM [id number]. Topic: Clinical - Prescription Issue >> Aug 20, 2024  3:26 PM [name] wrote: Reason for CRM: Husband Nicole ([place]) calling/ Refill request for continuous glucose sensor and lorazepam  were denied due to pt needing appointment. Pt made appt for soonest avail in January for Welcome to Medicare visit. Husband requesting refills as soon as possible because she is completely out. Requesting call once complete at [phone] or [phone]

## 2024-08-21 ENCOUNTER — Other Ambulatory Visit: Payer: Self-pay | Admitting: Family Medicine

## 2024-08-21 ENCOUNTER — Other Ambulatory Visit: Payer: Self-pay

## 2024-08-21 DIAGNOSIS — E10649 Type 1 diabetes mellitus with hypoglycemia without coma: Secondary | ICD-10-CM

## 2024-08-21 DIAGNOSIS — F410 Panic disorder [episodic paroxysmal anxiety] without agoraphobia: Secondary | ICD-10-CM

## 2024-08-21 MED ORDER — DEXCOM G6 SENSOR MISC: 3 refills | Status: DC

## 2024-08-21 MED ORDER — LORAZEPAM 0.5 MG PO TABS: 0.5000 mg | ORAL_TABLET | Freq: Every day | ORAL | 0 refills | Status: DC | PRN

## 2024-09-17 DIAGNOSIS — E10649 Type 1 diabetes mellitus with hypoglycemia without coma: Secondary | ICD-10-CM

## 2024-09-18 ENCOUNTER — Other Ambulatory Visit: Payer: Self-pay | Admitting: Family Medicine

## 2024-09-18 DIAGNOSIS — F410 Panic disorder [episodic paroxysmal anxiety] without agoraphobia: Secondary | ICD-10-CM

## 2024-09-25 ENCOUNTER — Ambulatory Visit: Admitting: Family Medicine

## 2024-09-25 ENCOUNTER — Encounter: Payer: Self-pay | Admitting: Family Medicine

## 2024-09-25 VITALS — BP 126/70 | HR 97 | Temp 97.9°F | Ht 61.0 in | Wt 139.1 lb

## 2024-09-25 DIAGNOSIS — E782 Mixed hyperlipidemia: Secondary | ICD-10-CM

## 2024-09-25 DIAGNOSIS — J4520 Mild intermittent asthma, uncomplicated: Secondary | ICD-10-CM

## 2024-09-25 DIAGNOSIS — E039 Hypothyroidism, unspecified: Secondary | ICD-10-CM | POA: Diagnosis not present

## 2024-09-25 DIAGNOSIS — Z23 Encounter for immunization: Secondary | ICD-10-CM

## 2024-09-25 DIAGNOSIS — Z13820 Encounter for screening for osteoporosis: Secondary | ICD-10-CM | POA: Diagnosis not present

## 2024-09-25 DIAGNOSIS — Z853 Personal history of malignant neoplasm of breast: Secondary | ICD-10-CM | POA: Diagnosis not present

## 2024-09-25 DIAGNOSIS — K219 Gastro-esophageal reflux disease without esophagitis: Secondary | ICD-10-CM

## 2024-09-25 DIAGNOSIS — Z0000 Encounter for general adult medical examination without abnormal findings: Secondary | ICD-10-CM | POA: Diagnosis not present

## 2024-09-25 DIAGNOSIS — Z780 Asymptomatic menopausal state: Secondary | ICD-10-CM

## 2024-09-25 DIAGNOSIS — E10649 Type 1 diabetes mellitus with hypoglycemia without coma: Secondary | ICD-10-CM | POA: Diagnosis not present

## 2024-09-25 LAB — POCT GLYCOSYLATED HEMOGLOBIN (HGB A1C): HbA1c POC (<> result, manual entry): 6.6 %

## 2024-09-25 LAB — POCT LIPID PANEL
HDL: 66
LDL: 121
Non-HDL: 138
TC: 20
TRG: 83

## 2024-09-25 MED ORDER — TOUJEO MAX SOLOSTAR 300 UNIT/ML ~~LOC~~ SOPN: 30.0000 [IU] | PEN_INJECTOR | Freq: Every day | SUBCUTANEOUS | 1 refills | Status: DC

## 2024-09-25 MED ORDER — DEXCOM G7 15 DAY SENSOR MISC: 3 refills | Status: AC

## 2024-09-25 MED ORDER — ROSUVASTATIN CALCIUM 20 MG PO TABS: 20.0000 mg | ORAL_TABLET | Freq: Every day | ORAL | 0 refills | Status: AC

## 2024-09-25 NOTE — Progress Notes (Signed)
 "  Chief Complaint  Patient presents with   Medicare Wellness     Subjective:   Nicole Marquez is a 64 y.o. female who presents for a Welcome to [facility] Exam.   Discussed the use of AI scribe software for clinical note transcription with the patient, who gave verbal consent to proceed.  History of Present Illness Nicole Marquez is a 64 year old female who presents for a routine follow-up visit.  Glycemic control - Diabetes managed with continuous glucose monitor. - Administers Novolog  3 to 5 units before two meals daily. - Uses Toujeo  30 units daily. - A1c is 6.6%.] (POCT) - Satisfied with current glycemic control.  Hyperlipidemia - Takes Crestor  10 mg daily. - LDL improved from 157 to 121. (POCT) - Total cholesterol improved from 239 to 200. - No muscle pain with Crestor . - Experienced muscle pain with Lipitor in the past.  Asthma - Uses albuterol  as needed, primarily during fall allergy season. - No recent use of Pulmicort .  Gastroesophageal reflux disease - Takes Protonix  40 mg daily for acid reflux.  Sleep disturbance - Uses lorazepam  at night for sleep.  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 - Clear margins and no lymph node involvement.  Colonic disease and anemia - Multiple colonoscopies due to colon issues and adhesions. - History of anemia related to colon pathology.  Lifestyle and exercise - Performs cardio on treadmill four to five times per week. - Reduced weight lifting - Maintains healthy diet. - History of smoking, quit long ago. - No alcohol consumption.   Visit info / Clinical Intake: Medicare Wellness Visit Type:: Welcome to [employer] (IPPE) Persons participating in visit and providing information:: patient Medicare Wellness Visit Mode:: In-person (required for [facility]) Interpreter Needed?: No Pre-visit prep was  completed: no AWV questionnaire completed by patient prior to visit?: yes Date:: 09/20/24 Living arrangements:: (Patient-Rptd) lives with spouse/significant other Patient's Overall Health Status Rating: (Patient-Rptd) good Typical amount of pain: (Patient-Rptd) some Does pain affect daily life?: (Patient-Rptd) no Are you currently prescribed opioids?: no  Dietary Habits and Nutritional Risks How many meals a day?: (Patient-Rptd) 2 Eats fruit and vegetables daily?: (Patient-Rptd) yes Most meals are obtained by: (Patient-Rptd) preparing own meals In the last 2 weeks, have you had any of the following?: none Diabetic:: (!) yes Any non-healing wounds?: no How often do you check your BS?: continuous glucose monitor Would you like to be referred to a Nutritionist or for Diabetic Management? : no  Functional Status Activities of Daily Living (to include ambulation/medication): (Patient-Rptd) Independent Ambulation: (Patient-Rptd) Independent Medication Administration: (Patient-Rptd) Independent Home Management (perform basic housework or laundry): (Patient-Rptd) Independent Manage your own finances?: (Patient-Rptd) yes Primary transportation is: (Patient-Rptd) family / friends Concerns about vision?: no *vision screening is required for WT[facility]* (Seen Eye Doctor yesterday for eye injections for macular degenration. See's eye doctor every 7 weeks.) Concerns about hearing?: no  Fall Screening Falls in the past year?: (Patient-Rptd) 0 Number of falls in past year: 0 Was there an injury with Fall?: 0 Fall Risk Category Calculator: 0 Patient Fall Risk Level: Low Fall Risk  Fall Risk Patient at Risk for Falls Due to: No Fall Risks; Impaired vision Fall risk Follow up: Falls evaluation completed  Home and Transportation Safety: All rugs have non-skid backing?: (Patient-Rptd) yes All stairs or steps have railings?: (Patient-Rptd) yes Grab bars in the bathtub  or shower?: (!) (Patient-Rptd)  no Have non-skid surface in bathtub or shower?: (Patient-Rptd) yes Good home lighting?: (Patient-Rptd) yes Regular seat belt use?: (Patient-Rptd) yes Hospital stays in the last year:: (Patient-Rptd) no  Cognitive Assessment Difficulty concentrating, remembering, or making decisions? : (Patient-Rptd) no Will 6CIT or Mini Cog be Completed: no 6CIT or Mini Cog Declined: patient declined  Advance Directives (For Healthcare) Does Patient Have a Medical Advance Directive?: Yes Does patient want to make changes to medical advance directive?: No - Patient declined  Reviewed/Updated  Reviewed/Updated: Reviewed All (Medical, Surgical, Family, Medications, Allergies, Care Teams, Patient Goals)    Allergies (verified) Ciprofloxacin and Lipitor [atorvastatin]   Current Medications (verified) Outpatient Encounter Medications as of 09/25/2024  Medication Sig   Continuous Glucose Sensor (DEXCOM G7 15 DAY SENSOR) MISC Replace sensor every 15 days   rosuvastatin  (CRESTOR ) 20 MG tablet Take 1 tablet (20 mg total) by mouth daily.   [DISCONTINUED] insulin  glargine, 2 Unit Dial, (TOUJEO  MAX SOLOSTAR) 300 UNIT/ML Solostar Pen INJECT 28 UNITS INTO THE SKIN AT BEDTIME. (Patient taking differently: 30 Units.)   Aflibercept  (EYLEA  IZ) 1 Application by Intravitreal route every 6 (six) weeks. Eye injection   albuterol  (VENTOLIN  HFA) 108 (90 Base) MCG/ACT inhaler INHALE 1 TO 2 PUFFS EVERY 4 HOURS AS NEEDED FOR WHEEZING   BD PEN NEEDLE NANO 2ND GEN 32G X 4 MM MISC 1 EACH BY DOES NOT APPLY ROUTE 4 (FOUR) TIMES DAILY.   Continuous Glucose Transmitter (DEXCOM G6 TRANSMITTER) MISC Change transmitter every 3 months DX CODE:  E10.649   insulin  aspart (NOVOLOG  FLEXPEN) 100 UNIT/ML FlexPen 3-5 U before each meal   insulin  glargine, 2 Unit Dial, (TOUJEO  MAX SOLOSTAR) 300 UNIT/ML Solostar Pen Inject 30 Units into the skin daily.   levothyroxine  (SYNTHROID ) 50 MCG tablet Take 1 tablet (50 mcg total) by mouth daily.    levothyroxine  (SYNTHROID ) 75 MCG tablet Take 1 tablet (75 mcg total) by mouth every other day.   LORazepam  (ATIVAN ) 0.5 MG tablet TAKE 1 TABLET (0.5 MG TOTAL) BY MOUTH DAILY AS NEEDED (PANIC ATTACKS).   montelukast  (SINGULAIR ) 10 MG tablet TAKE 1 TABLET BY MOUTH EVERYDAY AT BEDTIME   pantoprazole  (PROTONIX ) 40 MG tablet TAKE 1 TABLET BY MOUTH EVERY DAY   ramipril  (ALTACE ) 10 MG capsule TAKE 1 CAPSULE BY MOUTH EVERY DAY   [DISCONTINUED] Continuous Glucose Sensor (DEXCOM G6 SENSOR) MISC CHECK SUGARS BEFORE MEALS AND AT BEDTIME   [DISCONTINUED] PULMICORT  FLEXHALER 90 MCG/ACT inhaler INHALE 1 PUFF INTO THE LUNGS TWICE A DAY   [DISCONTINUED] rosuvastatin  (CRESTOR ) 10 MG tablet TAKE 1 TABLET BY MOUTH EVERY DAY   Facility-Administered Encounter Medications as of 09/25/2024  Medication   0.9 %  sodium chloride  infusion    History: Past Medical History:  Diagnosis Date   Anemia    Asthma    seasonal   Breast cancer (HCC) 2022   right   DCIS (ductal carcinoma in situ)    Exudative age-related macular degeneration, bilateral, stage unspecified (HCC)    Family history of colon cancer 02/27/2021   Family history of malignant neoplasm of digestive organs    GERD (gastroesophageal reflux disease)    History of hiatal hernia    History of kidney stones    Mixed hyperlipidemia    Other specified hypothyroidism    Type 1 diabetes mellitus with hypoglycemia without coma [facility])    Past Surgical History:  Procedure Laterality Date   BREAST BIOPSY Right 01/11/2024   MM RT BREAST BX W  LOC DEV 1ST LESION IMAGE BX SPEC STEREO GUIDE 01/11/2024 [facility] M[facility]MO[facility]   BREAST LUMPECTOMY     BREAST LUMPECTOMY WITH RADIOACTIVE SEED LOCALIZATION Right 05/28/2021   Procedure: RIGHT BREAST LUMPECTOMY WITH RADIOACTIVE SEED LOCALIZATION X2;  Surgeon: Aron Shoulders, MD;  Location: [facility] OR;  Service: General;  Laterality: Right;   CESAREAN SECTION     x3   COLONOSCOPY WITH PROPOFOL  N/A 07/19/2022   Procedure:  COLONOSCOPY WITH PROPOFOL ;  Surgeon: Eda Iha, MD;  Location: [facility];  Service: Gastroenterology;  Laterality: N/A;   COLONOSCOPY WITH PROPOFOL  N/A 09/07/2022   Procedure: COLONOSCOPY WITH PROPOFOL ;  Surgeon: Eda Iha, MD;  Location: [facility];  Service: Gastroenterology;  Laterality: N/A;  With APC treatment   ENDOMETRIAL ABLATION     HOT HEMOSTASIS N/A 09/07/2022   Procedure: HOT HEMOSTASIS (ARGON PLASMA COAGULATION/BICAP);  Surgeon: Eda Iha, MD;  Location: [facility];  Service: Gastroenterology;  Laterality: N/A;   Right rotator cuff repair     Family History  Problem Relation Age of Onset   Cancer - Other Mother 62       carcinoid of colon   Diabetes Father    Colon cancer Brother 39   Lung cancer Paternal Aunt        dx >50; smoking hx   Cancer - Other Maternal Grandfather 1       carcinoid of colon   Esophageal cancer Neg Hx    Social History   Occupational History   Not on file  Tobacco Use   Smoking status: Former    Current packs/day: 0.00    Average packs/day: 1 pack/day for 30.0 years (30.0 ttl pk-yrs)    Types: Cigarettes    Start date: 47    Quit date: 2008    Years since quitting: 18.0   Smokeless tobacco: Never  Vaping Use   Vaping status: Never Used  Substance and Sexual Activity   Alcohol use: Never   Drug use: Never   Sexual activity: Not Currently   Tobacco Counseling Counseling given: Not Answered  SDOH Screenings   Food Insecurity: No Food Insecurity (09/20/2024)  Housing: Low Risk (09/25/2024)  Transportation Needs: No Transportation Needs (09/25/2024)  Utilities: Not At Risk (09/25/2024)  Alcohol Screen: Low Risk (09/25/2024)  Depression (PHQ2-9): Low Risk (09/25/2024)  Financial Resource Strain: Low Risk (09/25/2024)  Physical Activity: Sufficiently Active (09/25/2024)  Social Connections: Moderately Integrated (09/25/2024)  Stress: Stress Concern Present (09/25/2024)  Tobacco Use: Medium Risk (09/30/2024)   Health Literacy: Adequate Health Literacy (09/25/2024)   See flowsheets for full screening details  Depression Screen PHQ 2 & 9 Depression Scale- Over the past 2 weeks, how often have you been bothered by any of the following problems? Little interest or pleasure in doing things: 0 Feeling down, depressed, or hopeless (PHQ Adolescent also includes...irritable): 0 PHQ-2 Total Score: 0 Trouble falling or staying asleep, or sleeping too much: 2 Feeling tired or having little energy: 2 Poor appetite or overeating (PHQ Adolescent also includes...weight loss): 0 Feeling bad about yourself - or that you are a failure or have let yourself or your family down: 0 Trouble concentrating on things, such as reading the newspaper or watching television (PHQ Adolescent also includes...like school work): 2 Moving or speaking so slowly that other people could have noticed. Or the opposite - being so fidgety or restless that you have been moving around a lot more than usual: 0 Thoughts that you would be better off dead, or of hurting yourself in some  way: 0 PHQ-9 Total Score: 6 If you checked off any problems, how difficult have these problems made it for you to do your work, take care of things at home, or get along with other people?: Somewhat difficult  Depression Treatment Depression Interventions/Treatment : Patient refuses Treatment      Review of Systems  Constitutional:  Negative for chills, diaphoresis, fever and malaise/fatigue.  HENT:  Negative for congestion, ear pain and sore throat.   Respiratory:  Negative for cough.   Cardiovascular:  Negative for chest pain and palpitations.  Gastrointestinal:  Negative for abdominal pain, constipation, diarrhea, nausea and vomiting.  Genitourinary:  Negative for dysuria and urgency.  Musculoskeletal:  Negative for joint pain and myalgias.  Neurological:  Negative for dizziness and headaches.  Psychiatric/Behavioral:  Negative for depression. The  patient is not nervous/anxious.         Objective:    Today's Vitals   09/25/24 1421  BP: 126/70  Pulse: 97  Temp: 97.9 F (36.6 C)  SpO2: 97%  Weight: 139 lb 1.6 oz (63.1 kg)  Height: 5' 1 (1.549 m)   Body mass index is 26.28 kg/m.   Physical Exam Vitals reviewed.  Constitutional:      Appearance: Normal appearance. She is normal weight.  Neck:     Vascular: No carotid bruit.  Cardiovascular:     Rate and Rhythm: Normal rate and regular rhythm.     Pulses: Normal pulses.     Heart sounds: Normal heart sounds.  Pulmonary:     Effort: Pulmonary effort is normal. No respiratory distress.     Breath sounds: Normal breath sounds.  Abdominal:     General: Abdomen is flat. Bowel sounds are normal.     Palpations: Abdomen is soft.     Tenderness: There is no abdominal tenderness.  Neurological:     Mental Status: She is alert and oriented to person, place, and time.  Psychiatric:        Mood and Affect: Mood normal.        Behavior: Behavior normal.     Diabetic Foot Exam - Simple   Simple Foot Form  09/25/2024  8:58 PM  Visual Inspection No deformities, no ulcerations, no other skin breakdown bilaterally: Yes Sensation Testing Intact to touch and monofilament testing bilaterally: Yes Pulse Check Posterior Tibialis and Dorsalis pulse intact bilaterally: Yes Comments       Hearing/Vision screen Vision Screening (Inadequate exam)  Comments: Unable to perform vision screening due to eye injections yesterday for macular degeneration.  Immunizations and Health Maintenance Health Maintenance  Topic Date Due   DTaP/Tdap/Td (1 - Tdap) Never done   Bone Density Scan  Never done   FOOT EXAM  01/27/2024   COVID-19 Vaccine (1) 10/11/2024 (Originally 03/17/1961)   Pneumococcal Vaccine: 50+ Years (1 of 2 - PCV) 09/25/2025 (Originally 09/16/1979)   Mammogram  01/09/2025   HEMOGLOBIN A1C  03/25/2025   OPHTHALMOLOGY EXAM  04/30/2025   Diabetic kidney evaluation - eGFR  measurement  09/25/2025   Diabetic kidney evaluation - Urine ACR  09/25/2025   Medicare Annual Wellness (AWV)  09/25/2025   Colonoscopy  09/08/2027   Influenza Vaccine  Completed   Hepatitis C Screening  Completed   Zoster Vaccines- Shingrix   Completed   Meningococcal B Vaccine  Aged Out    EKG: normal EKG, normal sinus rhythm, unchanged from previous tracings     Assessment/Plan:  This is a routine wellness examination and a chronic visit for Nicole Marquez.  Assessment & Plan Encounter for Medicare annual wellness exam EDUCATION GIVEN.   Orders:   EKG 12-Lead  Encounter for osteoporosis screening in asymptomatic postmenopausal patient  Orders:   DG Bone Density; Future  Type 1 diabetes mellitus with hypoglycemia without coma (HCC) Well-controlled with A1c of 6.6%. - Continue Dexcom G7 for continuous glucose monitoring. - Continue Novolog  3-5 units before meals as needed. - Continue Toujeo  30 units daily. - Continue altace  10 mg daily.  Orders:   insulin  glargine, 2 Unit Dial, (TOUJEO  MAX SOLOSTAR) 300 UNIT/ML Solostar Pen; Inject 30 Units into the skin daily.   Continuous Glucose Sensor (DEXCOM G7 15 DAY SENSOR) MISC; Replace sensor every 15 days   POCT glycosylated hemoglobin (Hb A1C)   Microalbumin / creatinine urine ratio  Mixed hyperlipidemia LDL at 121 mg/dL, above target for diabetics. Total cholesterol improved to 200 mg/dL. Crestor  10 mg daily improved LDL but not to target. - Increased Crestor  to 20 mg daily. - Instructed to double up on current 10 mg tablets until supply is exhausted. - Ordered liver function tests every three months. - Scheduled follow-up in three months to reassess lipid levels. Orders:   POCT Lipid Panel   CBC with Differential/Platelet   Comprehensive metabolic panel with GFR  Encounter for immunization  Orders:   Flu vaccine HIGH DOSE PF(Fluzone Trivalent)  Acquired hypothyroidism Managed with alternating doses of thyroid  medication.  Thyroid  function previously improved with this regimen. - Checked thyroid  function tests today. - Continue current thyroid  medication regimen. Orders:   T4, free   TSH  Mild intermittent asthma without complication Uses albuterol  as needed, primarily during allergy season. - Discontinued Pulmicort  from medication list. - Continue albuterol  as needed.     History of right breast cancer Review records.  Currently per patient getting mammograms every 6 months.     Gastroesophageal reflux disease without esophagitis Managed with Protonix  40 mg daily. - Continue Protonix  40 mg daily.       Patient Care Team: Sherre Clapper, MD as PCP - General (Family Medicine) Tyree Nanetta SAILOR, RN as Oncology Nurse Navigator Aron Shoulders, MD as Consulting Physician (General Surgery) Odean Potts, MD as Consulting Physician (Hematology and Oncology) Dewey Rush, MD as Consulting Physician (Radiation Oncology) Josh Server, MD as Referring Physician (Ophthalmology)  I have personally reviewed and noted the following in the patients chart:   Medical and social history Use of alcohol, tobacco or illicit drugs  Current medications and supplements including opioid prescriptions. Functional ability and status Nutritional status Physical activity Advanced directives List of other physicians Hospitalizations, surgeries, and ER visits in previous 12 months Vitals Screenings to include cognitive, depression, and falls Referrals and appointments  Orders Placed This Encounter  Procedures   DG Bone Density    Standing Status:   Future    Expiration Date:   09/25/2025    Reason for Exam (SYMPTOM  OR DIAGNOSIS REQUIRED):   Postmenopause    Preferred imaging location?:   [facility]   Flu vaccine HIGH DOSE PF(Fluzone Trivalent)   Microalbumin / creatinine urine ratio   CBC with Differential/Platelet   Comprehensive metabolic panel with GFR   T4, free   TSH   POCT glycosylated  hemoglobin (Hb A1C)    Associate with Z13.1   POCT Lipid Panel   EKG 12-Lead   EKG   In addition, I have reviewed and discussed with patient certain preventive protocols, quality metrics, and best practice recommendations. A written personalized care plan for preventive services  as well as general preventive health recommendations were provided to patient.   Abigail Free, MD   09/30/2024   Return in 3 months (on 12/24/2024) for chronic follow up.  "

## 2024-09-25 NOTE — Patient Instructions (Addendum)
" °  VISIT SUMMARY: Today we reviewed your diabetes management, cholesterol levels, asthma, acid reflux, sleep issues, and eye health. We also discussed your history of breast cancer and colon issues, and your general health maintenance.  YOUR PLAN: TYPE 1 DIABETES MELLITUS: Your diabetes is well-controlled with an A1c of 6.6%. -Continue using the Dexcom G7 for continuous glucose monitoring. -Continue taking Novolog  3-5 units before meals as needed. -Continue taking Toujeo  30 units daily.  MIXED HYPERLIPIDEMIA: Your LDL cholesterol is still above the target for diabetics, although your total cholesterol has improved. -Increase Crestor  to 20 mg daily. -Double up on your current 10 mg tablets until your supply is exhausted. -Get liver function tests every three months. -Follow up in three months to reassess your lipid levels.  ACQUIRED HYPOTHYROIDISM: Your thyroid  function is managed with alternating doses of thyroid  medication. -Continue your current thyroid  medication regimen. -We checked your thyroid  function tests today.  MILD INTERMITTENT ASTHMA: You use albuterol  as needed, primarily during allergy season. -Discontinue Pulmicort  from your medication list. -Continue using albuterol  as needed.  GASTROESOPHAGEAL REFLUX DISEASE: Your acid reflux is managed with Protonix  40 mg daily. -Continue taking Protonix  40 mg daily.  EXUDATIVE AGE-RELATED MACULAR DEGENERATION: You receive aflibercept  injections every seven weeks for your eye condition. -Continue aflibercept  injections every seven weeks.  HISTORY OF MALIGNANT NEOPLASM OF BREAST: You have a history of DCIS in situ with clear margins. -Continue getting mammograms every six months.  GENERAL HEALTH MAINTENANCE: You are due for a tetanus vaccine. -Obtain a tetanus vaccine at a pharmacy, as it is covered by Medicare.   "

## 2024-09-26 ENCOUNTER — Other Ambulatory Visit: Payer: Self-pay | Admitting: Family Medicine

## 2024-09-26 ENCOUNTER — Ambulatory Visit: Payer: Self-pay | Admitting: Family Medicine

## 2024-09-26 DIAGNOSIS — E1065 Type 1 diabetes mellitus with hyperglycemia: Secondary | ICD-10-CM

## 2024-09-26 DIAGNOSIS — E10649 Type 1 diabetes mellitus with hypoglycemia without coma: Secondary | ICD-10-CM

## 2024-09-26 DIAGNOSIS — R809 Proteinuria, unspecified: Secondary | ICD-10-CM

## 2024-09-26 LAB — T4, FREE: Free T4: 1.72 ng/dL (ref 0.82–1.77)

## 2024-09-26 LAB — CBC WITH DIFFERENTIAL/PLATELET
Basophils Absolute: 0.1 x10E3/uL (ref 0.0–0.2)
Basos: 1 %
EOS (ABSOLUTE): 0.1 x10E3/uL (ref 0.0–0.4)
Eos: 2 %
Hematocrit: 44.7 % (ref 34.0–46.6)
Hemoglobin: 14.7 g/dL (ref 11.1–15.9)
Immature Grans (Abs): 0 x10E3/uL (ref 0.0–0.1)
Immature Granulocytes: 0 %
Lymphocytes Absolute: 2.9 x10E3/uL (ref 0.7–3.1)
Lymphs: 43 %
MCH: 28.5 pg (ref 26.6–33.0)
MCHC: 32.9 g/dL (ref 31.5–35.7)
MCV: 87 fL (ref 79–97)
Monocytes Absolute: 0.5 x10E3/uL (ref 0.1–0.9)
Monocytes: 8 %
Neutrophils Absolute: 3.2 x10E3/uL (ref 1.4–7.0)
Neutrophils: 46 %
Platelets: 251 x10E3/uL (ref 150–450)
RBC: 5.16 x10E6/uL (ref 3.77–5.28)
RDW: 12.6 % (ref 11.7–15.4)
WBC: 6.8 x10E3/uL (ref 3.4–10.8)

## 2024-09-26 LAB — COMPREHENSIVE METABOLIC PANEL WITH GFR
ALT: 13 IU/L (ref 0–32)
AST: 23 IU/L (ref 0–40)
Albumin: 4.7 g/dL (ref 3.9–4.9)
Alkaline Phosphatase: 79 IU/L (ref 49–135)
BUN/Creatinine Ratio: 16 (ref 12–28)
BUN: 13 mg/dL (ref 8–27)
Bilirubin Total: 0.3 mg/dL (ref 0.0–1.2)
CO2: 24 mmol/L (ref 20–29)
Calcium: 10.2 mg/dL (ref 8.7–10.3)
Chloride: 100 mmol/L (ref 96–106)
Creatinine, Ser: 0.83 mg/dL (ref 0.57–1.00)
Globulin, Total: 2.1 g/dL (ref 1.5–4.5)
Glucose: 122 mg/dL — ABNORMAL HIGH (ref 70–99)
Potassium: 4.6 mmol/L (ref 3.5–5.2)
Sodium: 140 mmol/L (ref 134–144)
Total Protein: 6.8 g/dL (ref 6.0–8.5)
eGFR: 78 mL/min/1.73

## 2024-09-26 LAB — MICROALBUMIN / CREATININE URINE RATIO
Creatinine, Urine: 31.4 mg/dL
Microalb/Creat Ratio: 66 mg/g{creat} — ABNORMAL HIGH (ref 0–29)
Microalbumin, Urine: 20.7 ug/mL

## 2024-09-26 LAB — TSH: TSH: 3.26 u[IU]/mL (ref 0.450–4.500)

## 2024-09-26 MED ORDER — DEXCOM G7 RECEIVER DEVI: 1.0000 | Freq: Once | 0 refills | Status: AC

## 2024-09-26 NOTE — Progress Notes (Signed)
Dexcom G7 ordered

## 2024-09-27 MED ORDER — KERENDIA 10 MG PO TABS: 1.0000 | ORAL_TABLET | Freq: Every day | ORAL | 0 refills | Status: AC

## 2024-09-30 ENCOUNTER — Encounter: Payer: Self-pay | Admitting: Family Medicine

## 2024-09-30 DIAGNOSIS — K219 Gastro-esophageal reflux disease without esophagitis: Secondary | ICD-10-CM | POA: Insufficient documentation

## 2024-09-30 NOTE — Assessment & Plan Note (Addendum)
" °  Orders:   DG Bone Density; Future  "

## 2024-09-30 NOTE — Assessment & Plan Note (Addendum)
 Managed with Protonix  40 mg daily. - Continue Protonix  40 mg daily.

## 2024-09-30 NOTE — Assessment & Plan Note (Addendum)
 LDL at 121 mg/dL, above target for diabetics. Total cholesterol improved to 200 mg/dL. Crestor  10 mg daily improved LDL but not to target. - Increased Crestor  to 20 mg daily. - Instructed to double up on current 10 mg tablets until supply is exhausted. - Ordered liver function tests every three months. - Scheduled follow-up in three months to reassess lipid levels. Orders:   POCT Lipid Panel   CBC with Differential/Platelet   Comprehensive metabolic panel with GFR

## 2024-09-30 NOTE — Assessment & Plan Note (Addendum)
 Review records.  Currently per patient getting mammograms every 6 months.

## 2024-09-30 NOTE — Assessment & Plan Note (Addendum)
 Uses albuterol  as needed, primarily during allergy season. - Discontinued Pulmicort  from medication list. - Continue albuterol  as needed.

## 2024-09-30 NOTE — Assessment & Plan Note (Addendum)
 Managed with alternating doses of thyroid  medication. Thyroid  function previously improved with this regimen. - Checked thyroid  function tests today. - Continue current thyroid  medication regimen. Orders:   T4, free   TSH

## 2024-10-02 ENCOUNTER — Ambulatory Visit (INDEPENDENT_AMBULATORY_CARE_PROVIDER_SITE_OTHER)
Admission: RE | Admit: 2024-10-02 | Discharge: 2024-10-02 | Disposition: A | Discharge status: Home / Self Care | Source: Ambulatory Visit | Attending: Family Medicine | Admitting: Family Medicine

## 2024-10-02 DIAGNOSIS — Z13820 Encounter for screening for osteoporosis: Secondary | ICD-10-CM | POA: Diagnosis not present

## 2024-10-02 DIAGNOSIS — Z780 Asymptomatic menopausal state: Secondary | ICD-10-CM

## 2024-10-08 ENCOUNTER — Other Ambulatory Visit: Payer: Self-pay

## 2024-10-08 ENCOUNTER — Other Ambulatory Visit: Payer: Self-pay | Admitting: Family Medicine

## 2024-10-08 DIAGNOSIS — E10649 Type 1 diabetes mellitus with hypoglycemia without coma: Secondary | ICD-10-CM

## 2024-10-08 DIAGNOSIS — J4521 Mild intermittent asthma with (acute) exacerbation: Secondary | ICD-10-CM

## 2024-10-08 MED ORDER — TOUJEO MAX SOLOSTAR 300 UNIT/ML ~~LOC~~ SOPN: 30.0000 [IU] | PEN_INJECTOR | Freq: Every day | SUBCUTANEOUS | 1 refills | Status: AC

## 2024-10-08 MED ORDER — TOUJEO MAX SOLOSTAR 300 UNIT/ML ~~LOC~~ SOPN: 30.0000 [IU] | PEN_INJECTOR | Freq: Every day | SUBCUTANEOUS | 1 refills | Status: DC

## 2024-10-18 ENCOUNTER — Other Ambulatory Visit: Payer: Self-pay | Admitting: Family Medicine

## 2024-10-18 DIAGNOSIS — F410 Panic disorder [episodic paroxysmal anxiety] without agoraphobia: Secondary | ICD-10-CM

## 2024-10-29 ENCOUNTER — Other Ambulatory Visit

## 2024-12-25 ENCOUNTER — Ambulatory Visit: Admitting: Family Medicine
